# Patient Record
Sex: Female | Born: 1955
Health system: Southern US, Community
[De-identification: ages and names within clinical notes are randomized; demographics above are authoritative.]

## PROBLEM LIST (undated history)

## (undated) DIAGNOSIS — F419 Anxiety disorder, unspecified: Secondary | ICD-10-CM

## (undated) DIAGNOSIS — M810 Age-related osteoporosis without current pathological fracture: Secondary | ICD-10-CM

## (undated) DIAGNOSIS — I639 Cerebral infarction, unspecified: Secondary | ICD-10-CM

## (undated) DIAGNOSIS — E119 Type 2 diabetes mellitus without complications: Secondary | ICD-10-CM

## (undated) DIAGNOSIS — F32A Depression, unspecified: Secondary | ICD-10-CM

## (undated) DIAGNOSIS — H3530 Unspecified macular degeneration: Secondary | ICD-10-CM

## (undated) DIAGNOSIS — I10 Essential (primary) hypertension: Secondary | ICD-10-CM

## (undated) DIAGNOSIS — T7840XA Allergy, unspecified, initial encounter: Secondary | ICD-10-CM

## (undated) DIAGNOSIS — G4730 Sleep apnea, unspecified: Secondary | ICD-10-CM

## (undated) DIAGNOSIS — R011 Cardiac murmur, unspecified: Secondary | ICD-10-CM

## (undated) DIAGNOSIS — N200 Calculus of kidney: Secondary | ICD-10-CM

## (undated) DIAGNOSIS — M1990 Unspecified osteoarthritis, unspecified site: Secondary | ICD-10-CM

## (undated) DIAGNOSIS — K219 Gastro-esophageal reflux disease without esophagitis: Secondary | ICD-10-CM

## (undated) DIAGNOSIS — J449 Chronic obstructive pulmonary disease, unspecified: Secondary | ICD-10-CM

## (undated) HISTORY — PX: TUBAL LIGATION: SHX77

## (undated) HISTORY — DX: Calculus of kidney: N20.0

## (undated) HISTORY — DX: Anxiety disorder, unspecified: F41.9

## (undated) HISTORY — DX: Chronic obstructive pulmonary disease, unspecified: J44.9

## (undated) HISTORY — DX: Depression, unspecified: F32.A

## (undated) HISTORY — DX: Essential (primary) hypertension: I10

## (undated) HISTORY — PX: TONSILLECTOMY: SUR1361

## (undated) HISTORY — PX: JOINT REPLACEMENT: SHX530

## (undated) HISTORY — DX: Cardiac murmur, unspecified: R01.1

## (undated) HISTORY — DX: Allergy, unspecified, initial encounter: T78.40XA

## (undated) HISTORY — PX: CYSTOSCOPY/URETEROSCOPY/HOLMIUM LASER/STENT PLACEMENT: SHX6546

## (undated) HISTORY — DX: Age-related osteoporosis without current pathological fracture: M81.0

## (undated) HISTORY — PX: CHOLECYSTECTOMY: SHX55

## (undated) HISTORY — PX: FRACTURE SURGERY: SHX138

## (undated) HISTORY — DX: Unspecified osteoarthritis, unspecified site: M19.90

## (undated) HISTORY — DX: Type 2 diabetes mellitus without complications: E11.9

---

## 2007-07-18 DIAGNOSIS — Z794 Long term (current) use of insulin: Secondary | ICD-10-CM | POA: Insufficient documentation

## 2007-07-18 DIAGNOSIS — E119 Type 2 diabetes mellitus without complications: Secondary | ICD-10-CM | POA: Insufficient documentation

## 2007-07-20 DIAGNOSIS — E785 Hyperlipidemia, unspecified: Secondary | ICD-10-CM | POA: Insufficient documentation

## 2007-08-10 DIAGNOSIS — F3289 Other specified depressive episodes: Secondary | ICD-10-CM | POA: Insufficient documentation

## 2007-08-10 DIAGNOSIS — F329 Major depressive disorder, single episode, unspecified: Secondary | ICD-10-CM | POA: Insufficient documentation

## 2007-09-16 DIAGNOSIS — M545 Low back pain, unspecified: Secondary | ICD-10-CM | POA: Insufficient documentation

## 2008-08-22 DIAGNOSIS — I10 Essential (primary) hypertension: Secondary | ICD-10-CM | POA: Insufficient documentation

## 2010-08-06 DIAGNOSIS — K769 Liver disease, unspecified: Secondary | ICD-10-CM | POA: Insufficient documentation

## 2010-10-23 DIAGNOSIS — G4733 Obstructive sleep apnea (adult) (pediatric): Secondary | ICD-10-CM | POA: Insufficient documentation

## 2011-06-25 DIAGNOSIS — H5210 Myopia, unspecified eye: Secondary | ICD-10-CM | POA: Insufficient documentation

## 2011-10-16 DIAGNOSIS — R946 Abnormal results of thyroid function studies: Secondary | ICD-10-CM | POA: Insufficient documentation

## 2011-10-21 DIAGNOSIS — K769 Liver disease, unspecified: Secondary | ICD-10-CM | POA: Insufficient documentation

## 2011-12-30 DIAGNOSIS — M9260 Juvenile osteochondrosis of tarsus, unspecified ankle: Secondary | ICD-10-CM | POA: Insufficient documentation

## 2011-12-30 DIAGNOSIS — M7660 Achilles tendinitis, unspecified leg: Secondary | ICD-10-CM | POA: Insufficient documentation

## 2011-12-30 DIAGNOSIS — M216X9 Other acquired deformities of unspecified foot: Secondary | ICD-10-CM | POA: Insufficient documentation

## 2012-02-05 DIAGNOSIS — J449 Chronic obstructive pulmonary disease, unspecified: Secondary | ICD-10-CM | POA: Insufficient documentation

## 2014-06-13 DIAGNOSIS — E669 Obesity, unspecified: Secondary | ICD-10-CM | POA: Insufficient documentation

## 2014-06-13 DIAGNOSIS — M25569 Pain in unspecified knee: Secondary | ICD-10-CM | POA: Insufficient documentation

## 2014-06-13 DIAGNOSIS — M797 Fibromyalgia: Secondary | ICD-10-CM | POA: Insufficient documentation

## 2014-06-13 DIAGNOSIS — G894 Chronic pain syndrome: Secondary | ICD-10-CM | POA: Insufficient documentation

## 2014-06-13 DIAGNOSIS — Z9884 Bariatric surgery status: Secondary | ICD-10-CM | POA: Insufficient documentation

## 2014-06-13 DIAGNOSIS — E559 Vitamin D deficiency, unspecified: Secondary | ICD-10-CM | POA: Insufficient documentation

## 2014-08-10 DIAGNOSIS — M1710 Unilateral primary osteoarthritis, unspecified knee: Secondary | ICD-10-CM | POA: Insufficient documentation

## 2014-09-23 DIAGNOSIS — R87619 Unspecified abnormal cytological findings in specimens from cervix uteri: Secondary | ICD-10-CM | POA: Insufficient documentation

## 2014-09-30 DIAGNOSIS — E119 Type 2 diabetes mellitus without complications: Secondary | ICD-10-CM | POA: Insufficient documentation

## 2014-12-23 DIAGNOSIS — Z7189 Other specified counseling: Secondary | ICD-10-CM | POA: Insufficient documentation

## 2015-11-21 DIAGNOSIS — M5430 Sciatica, unspecified side: Secondary | ICD-10-CM | POA: Insufficient documentation

## 2016-01-10 DIAGNOSIS — M533 Sacrococcygeal disorders, not elsewhere classified: Secondary | ICD-10-CM | POA: Insufficient documentation

## 2016-02-02 DIAGNOSIS — N200 Calculus of kidney: Secondary | ICD-10-CM | POA: Insufficient documentation

## 2016-10-11 DIAGNOSIS — R1900 Intra-abdominal and pelvic swelling, mass and lump, unspecified site: Secondary | ICD-10-CM | POA: Insufficient documentation

## 2016-10-29 DIAGNOSIS — Z01810 Encounter for preprocedural cardiovascular examination: Secondary | ICD-10-CM | POA: Insufficient documentation

## 2016-10-29 DIAGNOSIS — I951 Orthostatic hypotension: Secondary | ICD-10-CM | POA: Insufficient documentation

## 2017-05-21 DIAGNOSIS — R413 Other amnesia: Secondary | ICD-10-CM | POA: Insufficient documentation

## 2019-08-12 ENCOUNTER — Encounter: Payer: Self-pay | Admitting: Internal Medicine

## 2019-10-13 DIAGNOSIS — R109 Unspecified abdominal pain: Secondary | ICD-10-CM | POA: Insufficient documentation

## 2019-10-14 DIAGNOSIS — K5909 Other constipation: Secondary | ICD-10-CM | POA: Insufficient documentation

## 2019-11-15 ENCOUNTER — Encounter: Payer: Self-pay | Admitting: Internal Medicine

## 2020-06-20 DIAGNOSIS — Z8673 Personal history of transient ischemic attack (TIA), and cerebral infarction without residual deficits: Secondary | ICD-10-CM | POA: Insufficient documentation

## 2020-06-20 DIAGNOSIS — Z87442 Personal history of urinary calculi: Secondary | ICD-10-CM | POA: Insufficient documentation

## 2020-06-20 DIAGNOSIS — F1190 Opioid use, unspecified, uncomplicated: Secondary | ICD-10-CM | POA: Insufficient documentation

## 2020-06-20 DIAGNOSIS — M810 Age-related osteoporosis without current pathological fracture: Secondary | ICD-10-CM | POA: Insufficient documentation

## 2020-06-20 DIAGNOSIS — Z8262 Family history of osteoporosis: Secondary | ICD-10-CM | POA: Insufficient documentation

## 2020-06-20 DIAGNOSIS — F419 Anxiety disorder, unspecified: Secondary | ICD-10-CM | POA: Insufficient documentation

## 2020-06-20 DIAGNOSIS — Z9181 History of falling: Secondary | ICD-10-CM | POA: Insufficient documentation

## 2020-06-26 ENCOUNTER — Emergency Department
Admission: EM | Admit: 2020-06-26 | Discharge: 2020-06-26 | Disposition: A | Discharge status: Home / Self Care | Payer: Medicare Other | Attending: Emergency Medicine | Admitting: Emergency Medicine

## 2020-06-26 ENCOUNTER — Other Ambulatory Visit: Payer: Self-pay

## 2020-06-26 ENCOUNTER — Emergency Department: Payer: Medicare Other

## 2020-06-26 DIAGNOSIS — J1282 Pneumonia due to coronavirus disease 2019: Secondary | ICD-10-CM | POA: Diagnosis not present

## 2020-06-26 DIAGNOSIS — U071 COVID-19: Secondary | ICD-10-CM

## 2020-06-26 DIAGNOSIS — M791 Myalgia, unspecified site: Secondary | ICD-10-CM | POA: Diagnosis present

## 2020-06-26 IMAGING — CR DG CHEST 2V
1 series · 2 of 2 positions shown · non-contrast
Comparison: None.

CLINICAL DATA: Cough

EXAM:
CHEST - 2 VIEW

[Series 1: dg chest 2 view · 0.14mm/px · 2 of 2 slices shown]
[im 1/2]
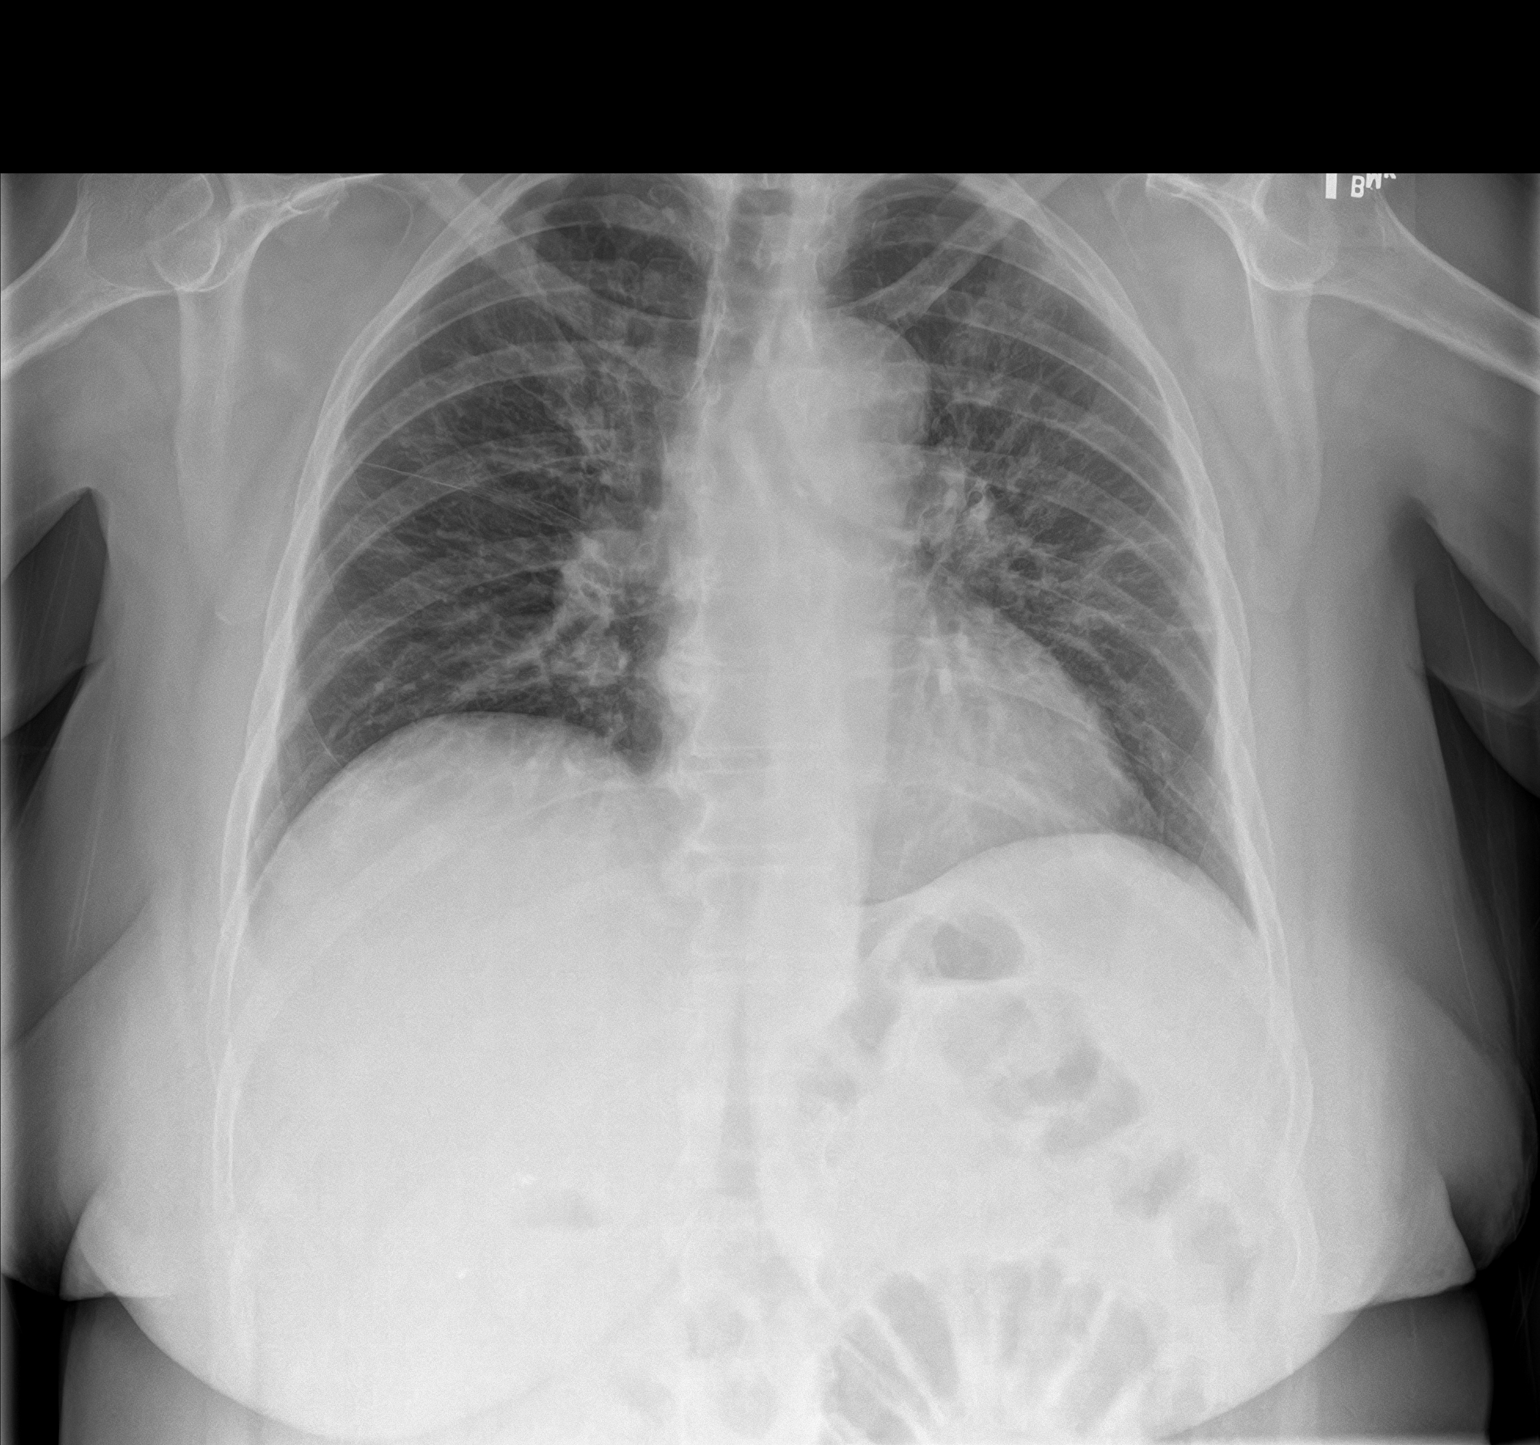
[im 2/2]
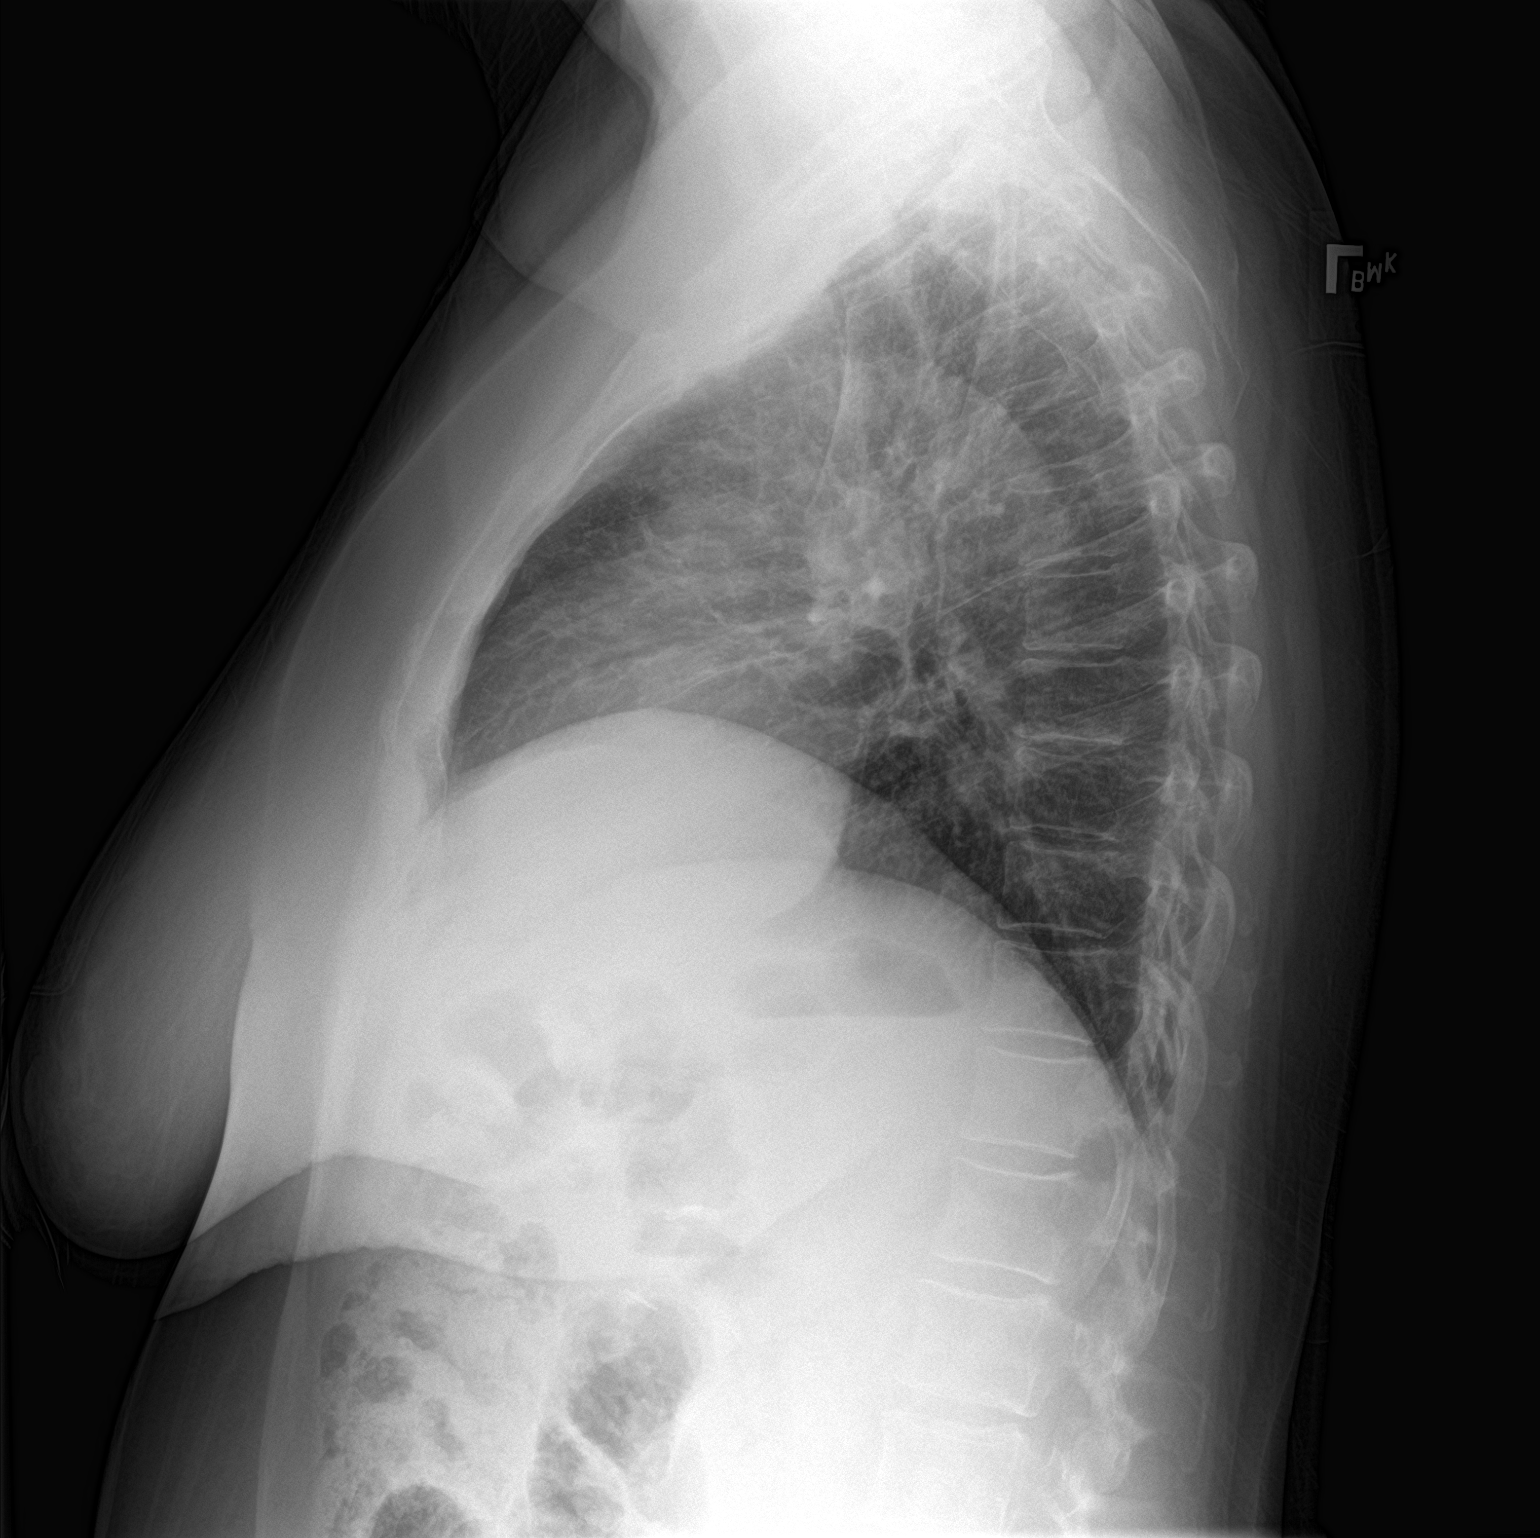

[2 of 2 positions shown; findings below may reference images not displayed]

FINDINGS: Streaky bilateral perihilar opacities. No pleural effusion. Normal
heart size. No pneumothorax.
IMPRESSION: Streaky bilateral perihilar opacities probably representing
pneumonia.

## 2020-06-26 MED ORDER — AZITHROMYCIN 250 MG PO TABS: ORAL_TABLET | ORAL | 0 refills | Status: DC

## 2020-06-26 MED ORDER — ALBUTEROL SULFATE HFA 108 (90 BASE) MCG/ACT IN AERS: 1.0000 | INHALATION_SPRAY | RESPIRATORY_TRACT | 1 refills | Status: DC | PRN

## 2020-06-26 MED ORDER — AZITHROMYCIN 500 MG PO TABS
500.0000 mg | ORAL_TABLET | Freq: Once | ORAL | Status: AC
  Administered 2020-06-26: 500 mg via ORAL
  Filled 2020-06-26: qty 1

## 2020-06-26 MED ORDER — PREDNISONE 10 MG (21) PO TBPK: ORAL_TABLET | ORAL | 0 refills | Status: DC

## 2020-06-26 NOTE — ED Provider Notes (Signed)
[facility] Emergency Department Provider Note  ____________________________________________  Time seen: Approximately 8:40 PM  I have reviewed the triage vital signs and the nursing notes.   HISTORY  Chief Complaint Covid Positive   HPI Lisa Matthews is a 65 y.o. female who presents to the emergency department for treatment and evaluation of body aches, fever, and cough for 2 weeks. She has had COVID and was treated with prednisone. She continues to have fever, body aches, chills, and cough.      No past medical history on file.  There are no problems to display for this patient.   Prior to Admission medications   Medication Sig Start Date End Date Taking? Authorizing Provider  albuterol (VENTOLIN HFA) 108 (90 Base) MCG/ACT inhaler Inhale 1 puff into the lungs every 4 (four) hours as needed for wheezing or shortness of breath. 06/26/20  Yes Alejandria Wessells B, FNP  azithromycin (ZITHROMAX) 250 MG tablet 2 tablets today, then 1 tablet for the next 4 days. 06/26/20  Yes Najae Rathert B, FNP  predniSONE (STERAPRED UNI-PAK 21 TAB) 10 MG (21) TBPK tablet Take 6 tablets on the first day and decrease by 1 tablet each day until finished. 06/26/20  Yes Ardyth Kelso B, FNP    Allergies Penicillins and Sulfa antibiotics  No family history on file.  Social History    Review of Systems Constitutional: Positive for fever/chills. Decreased appetite. ENT: No sore throat. Cardiovascular: Denies chest pain. Respiratory: Occasional shortness of breath. Positive for cough. Negative wheezing.  Gastrointestinal: Negative nausea,  no vomiting.  no diarrhea.  Musculoskeletal: Positive for body aches Skin: Negative for rash. Neurological: Negative for headaches ____________________________________________   PHYSICAL EXAM:  VITAL SIGNS: ED Triage Vitals [06/26/20 1934]  Enc Vitals Group     BP 119/69     Pulse Rate 69     Resp 20     Temp 98.3 F (36.8 C)      Temp Source Oral     SpO2 96 %     Weight 181 lb (82.1 kg)     Height 5\' 4"  (1.626 m)     Head Circumference      Peak Flow      Pain Score 8     Pain Loc      Pain Edu?      Excl. in [place]?     Constitutional: Alert and oriented. Acutely ill appearing and in no acute distress. Eyes: Conjunctivae are normal. Ears: exam deferred Nose: no sinus congestion noted; no rhinnorhea. Mouth/Throat: Mucous membranes are moist.  Oropharynx clear. Tonsils not visualized. Uvula midline. Neck: No stridor.  Lymphatic: No cervical lymphadenopathy. Cardiovascular: Normal rate, regular rhythm. Good peripheral circulation. Respiratory: Respirations are even and unlabored.  No retractions. Gastrointestinal: Soft and nontender.  Musculoskeletal: FROM x 4 extremities.  Neurologic:  Normal speech and language. Skin:  Skin is warm, dry and intact. No rash noted. Psychiatric: Mood and affect are normal. Speech and behavior are normal.  ____________________________________________   LABS (all labs ordered are listed, but only abnormal results are displayed)  Labs Reviewed - No data to display ____________________________________________  EKG  Not indicated. ____________________________________________  RADIOLOGY  Chest x-ray shows streaky perihilar opacities likely pneumonia. ____________________________________________   PROCEDURES  Procedure(s) performed: None  Critical Care performed: No ____________________________________________   INITIAL IMPRESSION / ASSESSMENT AND PLAN / ED COURSE  65 y.o. female who presents to the ER for continued COVID symptoms that are  not improving. See  HPI  for further details.  Chest x-ray is concerning for pneumonia. Vital signs are without indication of sepsis. She is not hypoxic. She will be discharged home with Azithromycin and prednisone taper as well as ventolin inhaler. She was encouraged to return if she becomes short of breath, develops chest  pain, or any other symptom of concern.   Medications  azithromycin (ZITHROMAX) tablet 500 mg (500 mg Oral Given 06/26/20 2139)    ED Discharge Orders         Ordered    azithromycin (ZITHROMAX) 250 MG tablet        06/26/20 2127    predniSONE (STERAPRED UNI-PAK 21 TAB) 10 MG (21) TBPK tablet        06/26/20 2127    albuterol (VENTOLIN HFA) 108 (90 Base) MCG/ACT inhaler  Every 4 hours PRN        06/26/20 2127           Pertinent labs & imaging results that were available during my care of the patient were reviewed by me and considered in my medical decision making (see chart for details).    If controlled substance prescribed during this visit, 12 month history viewed on the [facility] prior to issuing an initial prescription for Schedule II or III opiod. ____________________________________________   FINAL CLINICAL IMPRESSION(S) / ED DIAGNOSES  Final diagnoses:  Pneumonia due to COVID-19 virus    Note:  This document was prepared using Dragon voice recognition software and may include unintentional dictation errors.    Victorino Dike, FNP 06/26/20 2248    Delman Kitten, MD 06/27/20 2224

## 2020-06-26 NOTE — ED Triage Notes (Signed)
Pt in with co body aches, fever, cough for 2 weeks. Pt tested positive covid at that time. Pt here for persistent symptoms, no distress noted in triage.

## 2020-09-25 ENCOUNTER — Ambulatory Visit (INDEPENDENT_AMBULATORY_CARE_PROVIDER_SITE_OTHER): Payer: Medicare Other | Admitting: Internal Medicine

## 2020-09-25 ENCOUNTER — Encounter: Payer: Self-pay | Admitting: *Deleted

## 2020-09-25 ENCOUNTER — Encounter: Payer: Self-pay | Admitting: Internal Medicine

## 2020-09-25 ENCOUNTER — Other Ambulatory Visit: Payer: Self-pay

## 2020-09-25 ENCOUNTER — Ambulatory Visit: Admission: RE | Admit: 2020-09-25 | Payer: Medicare Other | Source: Ambulatory Visit

## 2020-09-25 VITALS — BP 187/85 | HR 66 | Temp 98.0°F | Ht 63.5 in | Wt 181.0 lb

## 2020-09-25 DIAGNOSIS — R011 Cardiac murmur, unspecified: Secondary | ICD-10-CM | POA: Diagnosis not present

## 2020-09-25 DIAGNOSIS — G8929 Other chronic pain: Secondary | ICD-10-CM | POA: Diagnosis not present

## 2020-09-25 DIAGNOSIS — R1013 Epigastric pain: Secondary | ICD-10-CM

## 2020-09-25 DIAGNOSIS — E119 Type 2 diabetes mellitus without complications: Secondary | ICD-10-CM | POA: Diagnosis not present

## 2020-09-25 DIAGNOSIS — I25119 Atherosclerotic heart disease of native coronary artery with unspecified angina pectoris: Secondary | ICD-10-CM

## 2020-09-25 LAB — MICROSCOPIC EXAMINATION: RBC, Urine: NONE SEEN /hpf (ref 0–2)

## 2020-09-25 LAB — URINALYSIS, ROUTINE W REFLEX MICROSCOPIC
Bilirubin, UA: NEGATIVE
Ketones, UA: NEGATIVE
Nitrite, UA: NEGATIVE
Protein,UA: NEGATIVE
RBC, UA: NEGATIVE
Specific Gravity, UA: 1.025 (ref 1.005–1.030)
Urobilinogen, Ur: 0.2 mg/dL (ref 0.2–1.0)
pH, UA: 5.5 (ref 5.0–7.5)

## 2020-09-25 LAB — BAYER DCA HB A1C WAIVED: HB A1C (BAYER DCA - WAIVED): 8.3 % — ABNORMAL HIGH (ref <7.0)

## 2020-09-25 MED ORDER — METFORMIN HCL 500 MG PO TABS: 500.0000 mg | ORAL_TABLET | Freq: Two times a day (BID) | ORAL | 3 refills | Status: DC

## 2020-09-25 MED ORDER — DAPAGLIFLOZIN PROPANEDIOL 5 MG PO TABS: 5.0000 mg | ORAL_TABLET | Freq: Every day | ORAL | 6 refills | Status: DC

## 2020-09-25 MED ORDER — LOSARTAN POTASSIUM 25 MG PO TABS: 25.0000 mg | ORAL_TABLET | Freq: Every day | ORAL | 3 refills | Status: DC

## 2020-09-25 MED ORDER — AMLODIPINE BESYLATE 2.5 MG PO TABS: 2.5000 mg | ORAL_TABLET | Freq: Every day | ORAL | 0 refills | Status: DC

## 2020-09-25 MED ORDER — OMEPRAZOLE 40 MG PO CPDR: 40.0000 mg | DELAYED_RELEASE_CAPSULE | Freq: Every day | ORAL | 3 refills | Status: DC

## 2020-09-25 NOTE — Progress Notes (Signed)
There were no vitals taken for this visit.   Subjective:    Patient ID: Lisa Matthews, female    DOB: 07-May-1956, 65 y.o.   MRN: [id number]  HPI: Lisa Matthews is a 65 y.o. female  Pt is here to [facility] She says she was taking meds s/p cholecystectomy x July 2021 fell and broke her rt femur 7/21. Sees ortho is in a lot of pain - is on oxycodone for such. Sees Dr. Selinda Flavin in [place].   Per pt says she has heart problems. Says she has heartburn in the central part of her upper abdomen, in the Lower E area/ epigastric pain - very painful just sitting around causes this. Says this started a month ago. Says it goes to the back and says she feels like its a stabbing pain.    Hypertension This is a chronic problem. Associated symptoms include anxiety. Pertinent negatives include no blurred vision, headaches, malaise/fatigue, neck pain, orthopnea, peripheral edema, PND or sweats.  Diabetes She presents for her follow-up diabetic visit. She has type 2 diabetes mellitus. Pertinent negatives for hypoglycemia include no headaches or sweats. Associated symptoms include weight loss. Pertinent negatives for diabetes include no blurred vision.  COPD There is no chest tightness or hoarse voice. Associated symptoms include weight loss. Pertinent negatives include no fever, headaches, malaise/fatigue, PND or sweats. Her past medical history is significant for COPD.  Abdominal Pain This is a new problem. The problem has been gradually worsening. The pain is located in the epigastric region. The pain is at a severity of 6/10. The pain is moderate. The quality of the pain is colicky. The abdominal pain radiates to the back. Associated symptoms include weight loss. Pertinent negatives include no anorexia, arthralgias, belching, constipation, diarrhea, dysuria (card), fever, flatus, frequency, headaches or hematochezia. Vomiting:  Associated symptoms comments: 25 lbs of weight loss in 1 year Gastric  bypass 2013 lost 70 lbs since then lost 25 lbs .    Chief Complaint  Patient presents with  . New Patient (Initial Visit)  . Hypertension  . Diabetes  . Anxiety  . Depression  . COPD  . Osteoarthritis    Relevant past medical, surgical, family and social history reviewed and updated as indicated. Interim medical history since our last visit reviewed. Allergies and medications reviewed and updated.  Review of Systems  Constitutional: Positive for weight loss. Negative for fever and malaise/fatigue.  HENT: Negative for hoarse voice.   Eyes: Negative for blurred vision.  Cardiovascular: Negative for orthopnea and PND.  Gastrointestinal: Positive for abdominal pain. Negative for anorexia, constipation, diarrhea, flatus and hematochezia. Vomiting:   Genitourinary: Negative for dysuria (card) and frequency.  Musculoskeletal: Negative for arthralgias and neck pain.  Neurological: Negative for headaches.    Per HPI unless specifically indicated above     Objective:    There were no vitals taken for this visit.  Wt Readings from Last 3 Encounters:  06/26/20 181 lb (82.1 kg)    Physical Exam Vitals and nursing note reviewed.  Constitutional:      General: She is not in acute distress.    Appearance: Normal appearance. She is not ill-appearing or diaphoretic.  HENT:     Head: Normocephalic and atraumatic.     Right Ear: Tympanic membrane and external ear normal. There is no impacted cerumen.     Left Ear: External ear normal.     Nose: No congestion or rhinorrhea.     Mouth/Throat:     Pharynx:  No oropharyngeal exudate or posterior oropharyngeal erythema.  Eyes:     Conjunctiva/sclera: Conjunctivae normal.     Pupils: Pupils are equal, round, and reactive to light.  Cardiovascular:     Rate and Rhythm: Normal rate and regular rhythm.     Heart sounds: No murmur heard. No friction rub. No gallop.   Pulmonary:     Effort: No respiratory distress.     Breath sounds: No  stridor. No wheezing or rhonchi.  Chest:     Chest wall: No tenderness.  Abdominal:     General: Abdomen is flat. Bowel sounds are normal. There is no distension.     Palpations: Abdomen is soft. There is no mass.     Tenderness: There is no abdominal tenderness. There is no guarding.  Musculoskeletal:        General: No swelling or deformity.     Cervical back: Normal range of motion and neck supple. No rigidity or tenderness.     Right lower leg: No edema.     Left lower leg: No edema.  Skin:    General: Skin is warm and dry.     Coloration: Skin is not jaundiced.     Findings: No erythema.  Neurological:     Mental Status: She is alert and oriented to person, place, and time. Mental status is at baseline.  Psychiatric:        Mood and Affect: Mood normal.        Behavior: Behavior normal.        Thought Content: Thought content normal.        Judgment: Judgment normal.     No results found for this or any previous visit.      Current Outpatient Medications:  .  acetaminophen (TYLENOL) 650 MG CR tablet, Take by mouth., Disp: , Rfl:  .  amLODipine (NORVASC) 2.5 MG tablet, Take 2.5 mg by mouth daily., Disp: , Rfl:  .  atenolol (TENORMIN) 50 MG tablet, Take by mouth., Disp: , Rfl:  .  Baclofen 5 MG TABS, Take 1 tablet by mouth 2 (two) times daily as needed., Disp: , Rfl:  .  Biotin w/ Vitamins C & E (HAIR/SKIN/NAILS PO), Take by mouth., Disp: , Rfl:  .  Calcium Carbonate-Vitamin D 600-400 MG-UNIT tablet, Take 1 tablet by mouth daily., Disp: , Rfl:  .  Cholecalciferol 25 MCG (1000 UT) tablet, Take by mouth., Disp: , Rfl:  .  cyanocobalamin 1000 MCG tablet, Take by mouth., Disp: , Rfl:  .  ELDERBERRY PO, Take by mouth., Disp: , Rfl:  .  gabapentin (NEURONTIN) 300 MG capsule, Take by mouth., Disp: , Rfl:  .  glipiZIDE-metformin (METAGLIP) 2.5-500 MG tablet, Take 1 tablet by mouth 2 (two) times daily., Disp: , Rfl:  .  ibuprofen (ADVIL) 600 MG tablet, Take 600 mg by mouth 2 (two)  times daily as needed., Disp: , Rfl:  .  insulin glargine (LANTUS) 100 UNIT/ML injection, Inject into the skin., Disp: , Rfl:  .  Lancets MISC, Frequency:PRN   Dosage:0.0     Instructions:  Note:Dose: 1, Disp: , Rfl:  .  mirtazapine (REMERON) 30 MG tablet, Take by mouth., Disp: , Rfl:  .  oxyCODONE (OXY IR/ROXICODONE) 5 MG immediate release tablet, Take 5 mg by mouth 2 (two) times daily as needed., Disp: , Rfl:  .  Teriparatide, Recombinant, 620 MCG/2.48ML SOPN, Inject into the skin., Disp: , Rfl:  .  tiZANidine (ZANAFLEX) 4 MG tablet, Take by mouth.,  Disp: , Rfl:  .  venlafaxine XR (EFFEXOR-XR) 150 MG 24 hr capsule, Take by mouth., Disp: , Rfl:  .  vitamin C (ASCORBIC ACID) 500 MG tablet, Take 500 mg by mouth daily., Disp: , Rfl:  .  zinc gluconate 50 MG tablet, Take 50 mg by mouth daily., Disp: , Rfl:     Assessment & Plan:  1. Rt leg pain :  Sees pain mx for such Will refer . To obtain pain meds from her pain mx specialist @ last seen in [place].   2. Epigastric pain : reconfirmed with pt NO PAIN TODAY.  CHECK EKG  Will refer to GI as well. Will check Ct abdomen/ pelvis Check CMP/ Lipase h.o gall stones.  Advised to call the office or go to the ER if she develops chest pain, worsening abdominal pain, , any new onset of bleeding / black stools or  fresh red blood from any orifice,  shortness of breath dizziness or tingling or numbness.  Pt verbalized understanding of such.  3. DM 8.3 from 11  Is on  lantus SSI and glipizide/ metformin for scuh consider stopping sulfonylureas. Change to Lantus 30 units q pm is on 25 untis of lantus.  check HbA1c,  urine  microalbumin  diabetic diet plan given to pt  adviced regarding hypoglycemia and instructions given to pt today on how to prevent and treat the same if it were to occur. pt acknowledges the plan and voices understanding of the same.  exercise plan given and encouraged.   advice diabetic yearly podiatry, ophthalmology , nutritionist ,  dental check q 6 months,  4. HTN : add losartan amlodipine sent to pharmacy Continue current meds.  Medication compliance emphasised. pt advised to keep Bp logs. Pt verbalised understanding of the same. Pt to have a low salt diet . Exercise to reach a goal of at least 150 mins a week.  lifestyle modifications explained and pt understands importance of the above.  5. CAD :  S/p stenting , ho cardiac murmer  EKG -ve at this time.    6. HTG  HLD - LDL NOT AT GOAL consider repatha / pt is allergic to statins Consider zetia.   Problem List Items Addressed This Visit   None   Visit Diagnoses    Other chronic pain    -  Primary   Relevant Medications   acetaminophen (TYLENOL) 650 MG CR tablet   Baclofen 5 MG TABS   gabapentin (NEURONTIN) 300 MG capsule   ibuprofen (ADVIL) 600 MG tablet   mirtazapine (REMERON) 30 MG tablet   oxyCODONE (OXY IR/ROXICODONE) 5 MG immediate release tablet   tiZANidine (ZANAFLEX) 4 MG tablet   venlafaxine XR (EFFEXOR-XR) 150 MG 24 hr capsule   Other Relevant Orders   Ambulatory referral to Pain Clinic   Epigastric pain       Relevant Orders   CT ABDOMEN PELVIS W WO CONTRAST   CBC with Differential/Platelet   Comprehensive metabolic panel   Lipid panel   TSH   Urinalysis, Routine w reflex microscopic   Lipase   Diabetes mellitus without complication (HCC)       Relevant Medications   glipiZIDE-metformin (METAGLIP) 2.5-500 MG tablet   insulin glargine (LANTUS) 100 UNIT/ML injection   Other Relevant Orders   Bayer DCA Hb A1c Waived   Heart murmur       Relevant Orders   EKG 12-Lead       Follow up plan: No follow-ups on file.  Cscope - referred to GI  Mammogram - will check.

## 2020-09-26 ENCOUNTER — Ambulatory Visit
Admission: RE | Admit: 2020-09-26 | Discharge: 2020-09-26 | Disposition: A | Discharge status: Home / Self Care | Payer: Medicare Other | Source: Ambulatory Visit | Attending: Internal Medicine | Admitting: Internal Medicine

## 2020-09-26 ENCOUNTER — Telehealth: Payer: Self-pay

## 2020-09-26 ENCOUNTER — Other Ambulatory Visit: Payer: Self-pay

## 2020-09-26 DIAGNOSIS — R1013 Epigastric pain: Secondary | ICD-10-CM

## 2020-09-26 LAB — COMPREHENSIVE METABOLIC PANEL
ALT: 20 IU/L (ref 0–32)
AST: 16 IU/L (ref 0–40)
Albumin/Globulin Ratio: 1.6 (ref 1.2–2.2)
Albumin: 4.1 g/dL (ref 3.8–4.8)
Alkaline Phosphatase: 151 IU/L — ABNORMAL HIGH (ref 44–121)
BUN/Creatinine Ratio: 22 (ref 12–28)
BUN: 13 mg/dL (ref 8–27)
Bilirubin Total: 0.2 mg/dL (ref 0.0–1.2)
CO2: 22 mmol/L (ref 20–29)
Calcium: 10.5 mg/dL — ABNORMAL HIGH (ref 8.7–10.3)
Chloride: 105 mmol/L (ref 96–106)
Creatinine, Ser: 0.6 mg/dL (ref 0.57–1.00)
Globulin, Total: 2.6 g/dL (ref 1.5–4.5)
Glucose: 154 mg/dL — ABNORMAL HIGH (ref 65–99)
Potassium: 4.7 mmol/L (ref 3.5–5.2)
Sodium: 144 mmol/L (ref 134–144)
Total Protein: 6.7 g/dL (ref 6.0–8.5)
eGFR: 100 mL/min/{1.73_m2} (ref >59)

## 2020-09-26 LAB — LIPID PANEL
Chol/HDL Ratio: 6 ratio — ABNORMAL HIGH (ref 0.0–4.4)
Cholesterol, Total: 260 mg/dL — ABNORMAL HIGH (ref 100–199)
HDL: 43 mg/dL (ref >39)
LDL Chol Calc (NIH): 151 mg/dL — ABNORMAL HIGH (ref 0–99)
Triglycerides: 356 mg/dL — ABNORMAL HIGH (ref 0–149)
VLDL Cholesterol Cal: 66 mg/dL — ABNORMAL HIGH (ref 5–40)

## 2020-09-26 LAB — CBC WITH DIFFERENTIAL/PLATELET
Basophils Absolute: 0 10*3/uL (ref 0.0–0.2)
Basos: 1 %
EOS (ABSOLUTE): 0.2 10*3/uL (ref 0.0–0.4)
Eos: 3 %
Hematocrit: 40.4 % (ref 34.0–46.6)
Hemoglobin: 13.6 g/dL (ref 11.1–15.9)
Immature Grans (Abs): 0 10*3/uL (ref 0.0–0.1)
Immature Granulocytes: 1 %
Lymphocytes Absolute: 1.9 10*3/uL (ref 0.7–3.1)
Lymphs: 32 %
MCH: 30.3 pg (ref 26.6–33.0)
MCHC: 33.7 g/dL (ref 31.5–35.7)
MCV: 90 fL (ref 79–97)
Monocytes Absolute: 0.6 10*3/uL (ref 0.1–0.9)
Monocytes: 11 %
Neutrophils Absolute: 3.2 10*3/uL (ref 1.4–7.0)
Neutrophils: 52 %
Platelets: 195 10*3/uL (ref 150–450)
RBC: 4.49 x10E6/uL (ref 3.77–5.28)
RDW: 12.2 % (ref 11.7–15.4)
WBC: 5.9 10*3/uL (ref 3.4–10.8)

## 2020-09-26 LAB — LIPASE: Lipase: 36 U/L (ref 14–72)

## 2020-09-26 LAB — TSH: TSH: 2.25 u[IU]/mL (ref 0.450–4.500)

## 2020-09-26 IMAGING — CT CT ABD-PELV W/ CM
1 of 3 series · 14 of 32 positions shown, 19 images · IV contrast (APPLIED)
Comparison: None.

CLINICAL DATA: Upper abdominal and epigastric pain over the last
month. Previous bariatric surgery.

EXAM:
CT ABDOMEN AND PELVIS WITH CONTRAST
TECHNIQUE: Multidetector CT imaging of the abdomen and pelvis was performed
using the standard protocol following bolus administration of
intravenous contrast.
CONTRAST:  100mL OMNIPAQUE IOHEXOL 300 MG/ML  SOLN

[Series 2: axial st · axial · 0.82mm/px · z∈[-953,-478]mm · 14 of 109 slices shown, 19 images]
[im 7/109  soft-tissue]
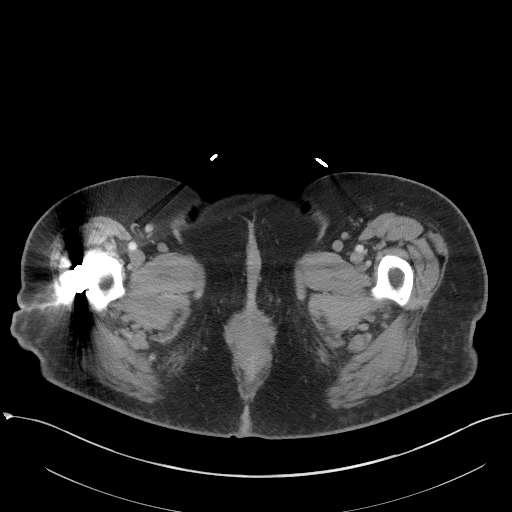
[im 7/109  bone]
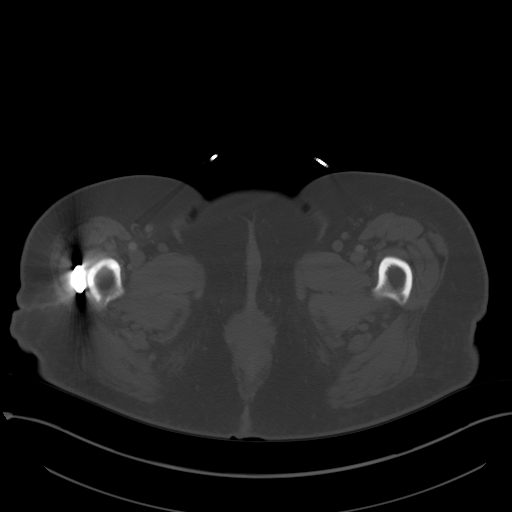
[im 13/109  soft-tissue]
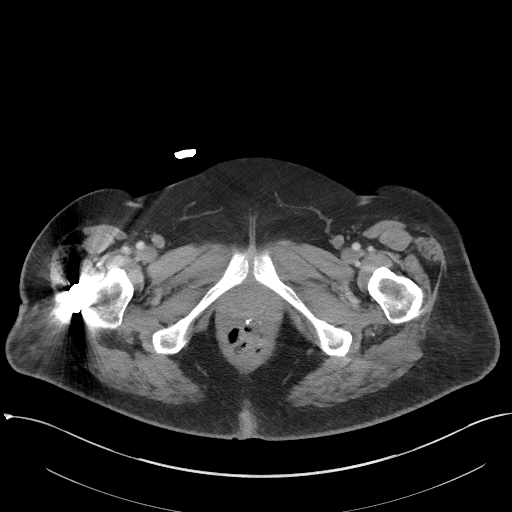
[im 26/109  soft-tissue]
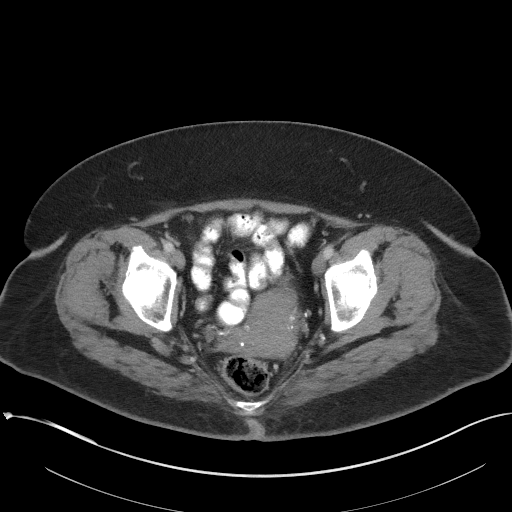
[im 32/109  soft-tissue]
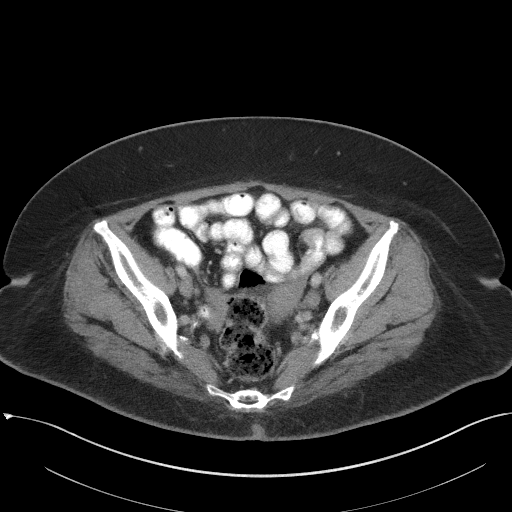
[im 39/109  soft-tissue]
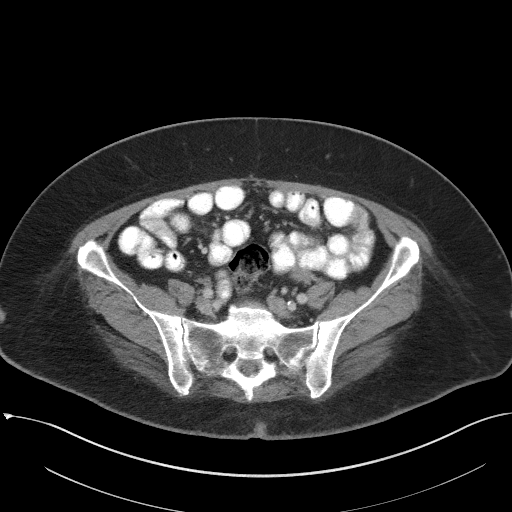
[im 45/109  soft-tissue]
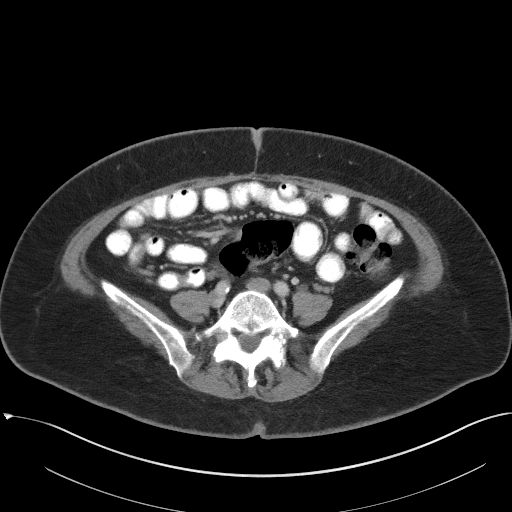
[im 58/109  soft-tissue]
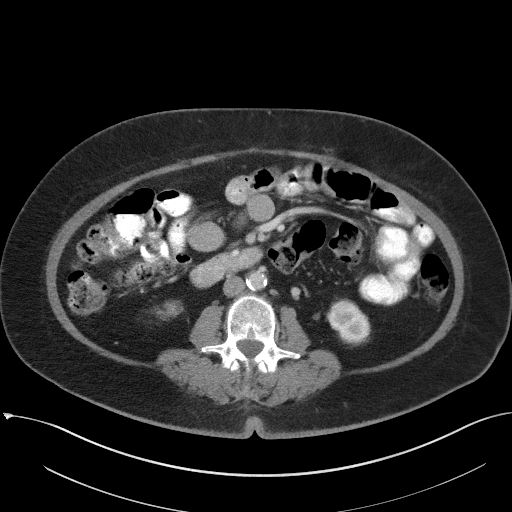
[im 64/109  soft-tissue]
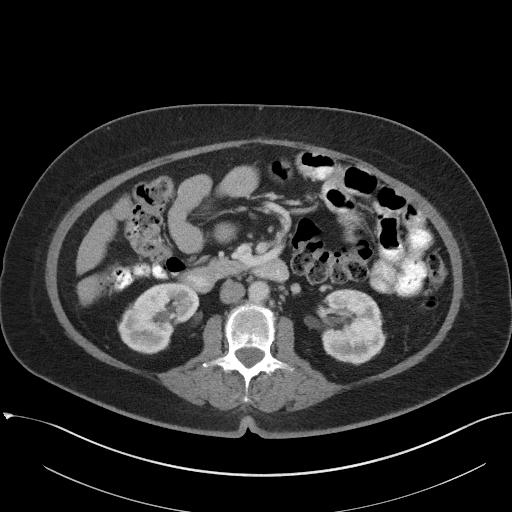
[im 70/109  soft-tissue]
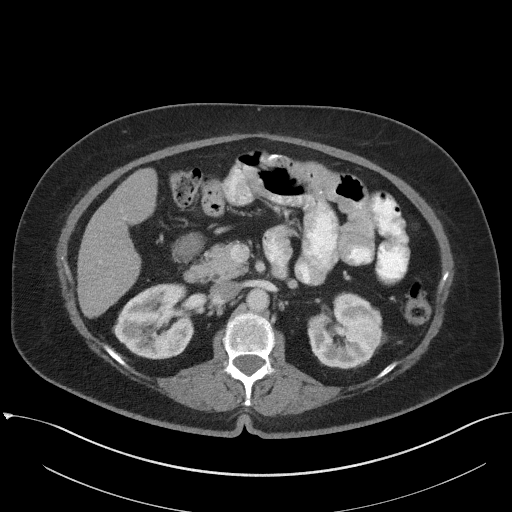
[im 70/109  bone]
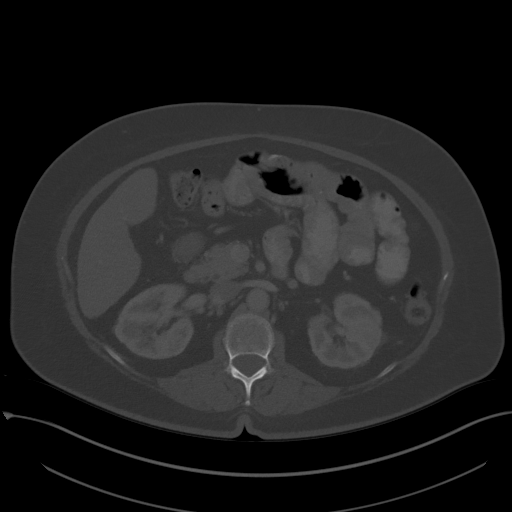
[im 77/109  soft-tissue]
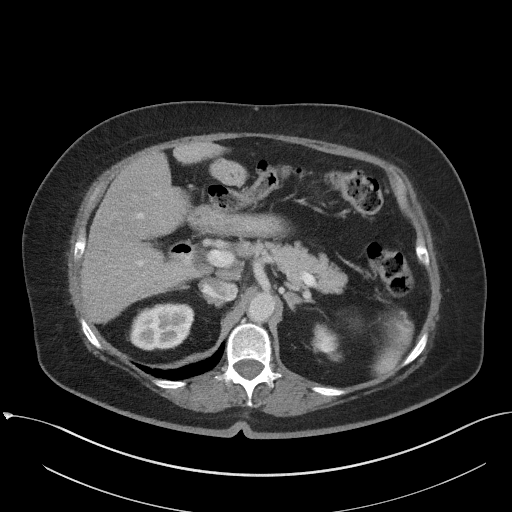
[im 83/109  soft-tissue]
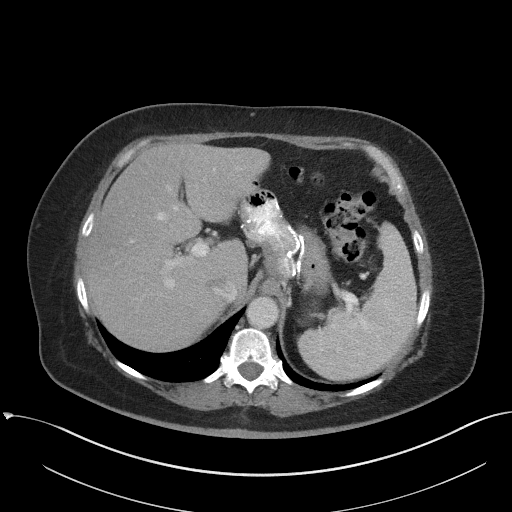
[im 83/109  lung]
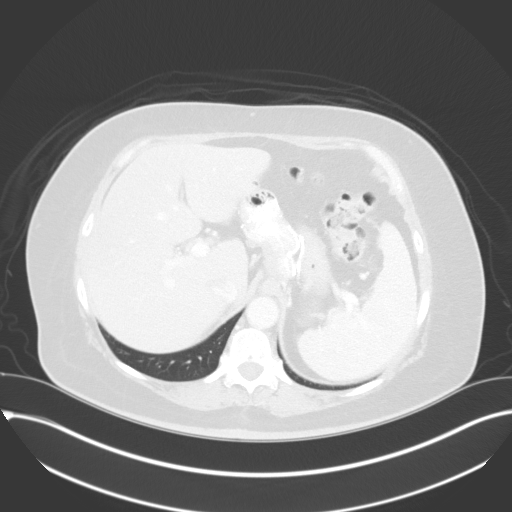
[im 89/109  lung]
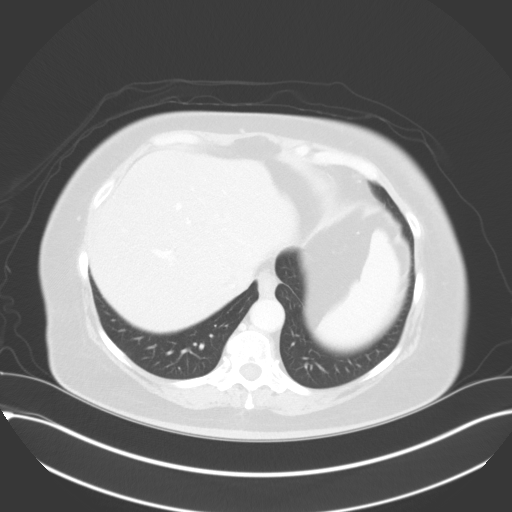
[im 96/109  soft-tissue]
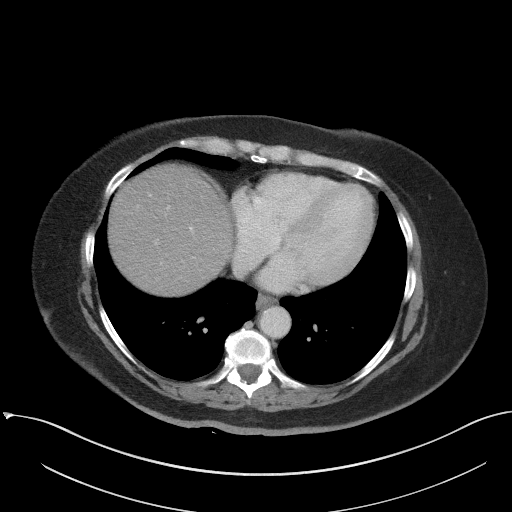
[im 96/109  lung]
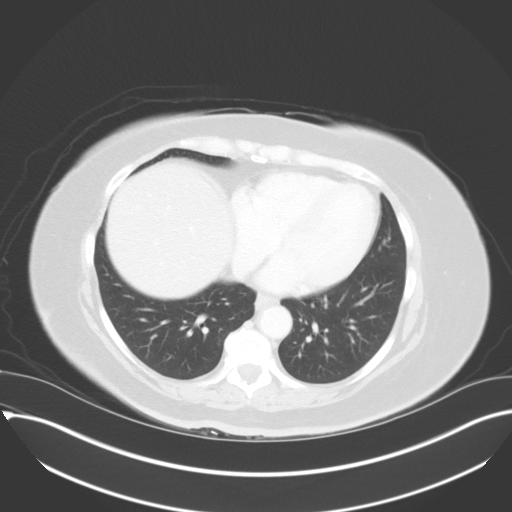
[im 102/109  soft-tissue]
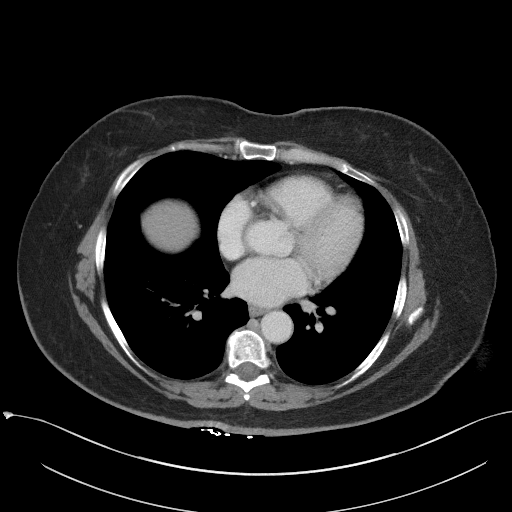
[im 102/109  lung]
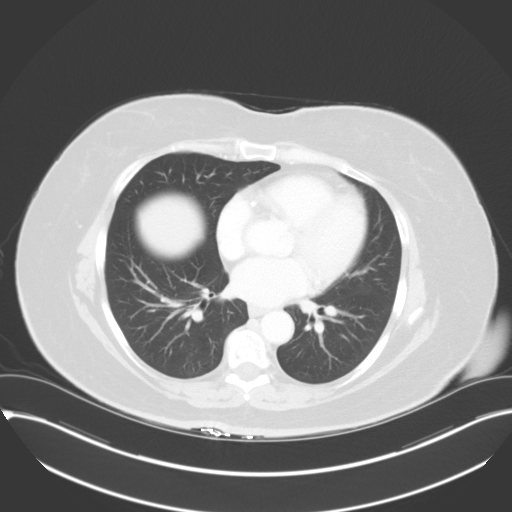

[14 of 32 positions shown; findings below may reference images not displayed]

FINDINGS: Lower chest: Lung bases are clear.  No pleural or pericardial fluid.

Hepatobiliary: Previous cholecystectomy. Liver parenchyma shows mild
fatty change without focal lesion.

Pancreas: Normal

Spleen: Normal

Adrenals/Urinary Tract: Adrenal glands are normal. Left kidney is
normal. No cyst, mass, stone or hydronephrosis. Right kidney shows
mild atrophy of the lower pole with a nonobstructing 9 mm stone. No
stone in either ureter. Bladder is normal.

Stomach/Bowel: Previous bariatric surgery with Roux-en-Y
configuration. No complicating feature seen related to that. No
other intestinal abnormality seen. No obstruction. No inflammatory
change.

Vascular/Lymphatic: Aortic atherosclerosis. No aneurysm. IVC is
normal. No adenopathy.

Reproductive: Normal.  No pelvic mass.

Other: No free fluid or air.

Musculoskeletal: Ordinary lower lumbar degenerative changes. No
evidence of abdominal wall hernia.
IMPRESSION: 1. No acute finding by CT. Previous bariatric surgery with Roux-en-Y
configuration. No complicating feature seen related to that.
2. Mild fatty change of the liver.
3. Previous cholecystectomy.
4. Aortic atherosclerosis.
5. Nonobstructing 9 mm stone in the lower pole of the right kidney.
6. These results will be called to the ordering clinician or
representative by the Radiologist Assistant, and communication
documented in the PACS or [REDACTED].

Aortic Atherosclerosis ([F3]-[F3]).

## 2020-09-26 MED ORDER — FENOFIBRATE 145 MG PO TABS: 145.0000 mg | ORAL_TABLET | Freq: Every day | ORAL | 6 refills | Status: DC

## 2020-09-26 MED ORDER — IOHEXOL 300 MG/ML  SOLN
100.0000 mL | Freq: Once | INTRAMUSCULAR | Status: AC | PRN
  Administered 2020-09-26: 100 mL via INTRAVENOUS

## 2020-09-26 NOTE — Telephone Encounter (Signed)
Pt states that someone from the office called her no documentation   Copied from [place] [phone]. Topic: General - Other >> Sep 26, 2020  2:21 PM Tessa Lerner A wrote: Reason for CRM: Patient has returned missed call from practice  Please contact patient to further advise when possible

## 2020-09-26 NOTE — Telephone Encounter (Signed)
Routing to provider  

## 2020-09-26 NOTE — Telephone Encounter (Signed)
Call pt to schedule appt for tomorrow per Dr Neomia Dear pt states that she is going out of town tomorrow and can not come in tomorrow she states that she has read her results online and with her cholesterol she can not take statin drugs. Please advise.

## 2020-09-26 NOTE — Telephone Encounter (Signed)
Copied from [place] [id number]. Topic: General - Other >> Sep 26, 2020 11:43 AM Tessa Lerner A wrote: Reason for CRM: Olivia Mackie with [facility] has called to provide reports for patient's imaging  Olivia Mackie shares that there were "no acute findings by CT"  Please contact to further discuss report if needed

## 2020-09-27 NOTE — Telephone Encounter (Signed)
Pt returned call and needs to be reached either before 11:30 or after 1:30 pm. Please advise [phone]

## 2020-09-27 NOTE — Telephone Encounter (Signed)
Patient was made aware, verbalized understanding, has appointment next week.

## 2020-10-02 ENCOUNTER — Ambulatory Visit: Payer: Self-pay | Admitting: *Deleted

## 2020-10-02 NOTE — Telephone Encounter (Addendum)
Pt called in concerned that her BP has been elevated for 4 days now.   Her BP medication was switched around recently.  C/o ringing in her ears all the time and having a headache.  Her BP this morning was 160/100 and it's been around this reading for the last 4 days.  She is on her way out of town when she called.   I let her know I would pass this information to Dr.Vigg and someone would call her back.   She was agreeable to this.   She will be back in town around 4:00 today.  She can be reached on her cell phone.  [phone].  The office is closed for lunch now I am forwarding my notes high priority to [facility].     Reason for Disposition . [1] Taking BP medications AND [2] feels is having side effects (e.g., impotence, cough, dizzy upon standing)  Answer Assessment - Initial Assessment Questions 1. BLOOD PRESSURE: "What is the blood pressure?" "Did you take at least two measurements 5 minutes apart?"     My BP is elevated for 4 days.  This morning as soon as I got up.  Ears ringing and headaches.   It's been high for 4 days.   2. ONSET: "When did you take your blood pressure?"     Yes 3. HOW: "How did you obtain the blood pressure?" (e.g., visiting nurse, automatic home BP monitor)     She took me off of atenolol and put me on a new BP medication 4. HISTORY: "Do you have a history of high blood pressure?"     Yes 5. MEDICATIONS: "Are you taking any medications for blood pressure?" "Have you missed any doses recently?"     Yes    6 days on the new medicine. 6. OTHER SYMPTOMS: "Do you have any symptoms?" (e.g., headache, chest pain, blurred vision, difficulty breathing, weakness)     Ringing in ears all the time, headaches. 7. PREGNANCY: "Is there any chance you are pregnant?" "When was your last menstrual period?"     N/A  Protocols used: BLOOD PRESSURE - HIGH-A-AH

## 2020-10-02 NOTE — Telephone Encounter (Signed)
Patient was made aware of Dr. Levada Dy recommendations to go to ER if still symptomatic, Patient states that she was only taking 2 of the three meds for BP, informed her to start taking the Atenolol as well as the losartan and amlodipine. Patient states that she will restart the Atenolol and if still symptomatic in morning will go to the ER as recommended

## 2020-10-05 ENCOUNTER — Encounter: Payer: Self-pay | Admitting: Internal Medicine

## 2020-10-05 ENCOUNTER — Other Ambulatory Visit: Payer: Self-pay

## 2020-10-05 ENCOUNTER — Ambulatory Visit (INDEPENDENT_AMBULATORY_CARE_PROVIDER_SITE_OTHER): Payer: Medicare Other | Admitting: Internal Medicine

## 2020-10-05 VITALS — BP 137/85 | HR 53 | Temp 97.9°F | Ht 63.5 in | Wt 181.0 lb

## 2020-10-05 DIAGNOSIS — E139 Other specified diabetes mellitus without complications: Secondary | ICD-10-CM | POA: Diagnosis not present

## 2020-10-05 DIAGNOSIS — Z8262 Family history of osteoporosis: Secondary | ICD-10-CM

## 2020-10-05 DIAGNOSIS — Z1329 Encounter for screening for other suspected endocrine disorder: Secondary | ICD-10-CM

## 2020-10-05 DIAGNOSIS — N200 Calculus of kidney: Secondary | ICD-10-CM | POA: Diagnosis not present

## 2020-10-05 DIAGNOSIS — H9319 Tinnitus, unspecified ear: Secondary | ICD-10-CM

## 2020-10-05 DIAGNOSIS — E785 Hyperlipidemia, unspecified: Secondary | ICD-10-CM

## 2020-10-05 DIAGNOSIS — Z1211 Encounter for screening for malignant neoplasm of colon: Secondary | ICD-10-CM

## 2020-10-05 DIAGNOSIS — M810 Age-related osteoporosis without current pathological fracture: Secondary | ICD-10-CM

## 2020-10-05 MED ORDER — AMLODIPINE BESYLATE 2.5 MG PO TABS: 2.5000 mg | ORAL_TABLET | Freq: Every day | ORAL | 3 refills | Status: DC

## 2020-10-05 NOTE — Progress Notes (Signed)
BP 137/85   Pulse (!) 53   Temp 97.9 F (36.6 C) (Oral)   Ht 5' 3.5" (1.613 m)   Wt 181 lb (82.1 kg)   SpO2 99%   BMI 31.56 kg/m    Subjective:    Patient ID: Lisa Matthews, female    DOB: 1956/02/23, 65 y.o.   MRN: [id number]  HPI: Lisa Matthews is a 65 y.o. female  Pt is here for a fu.   Diabetes She presents for her follow-up diabetic visit. She has type 2 diabetes mellitus. Pertinent negatives for hypoglycemia include no confusion, dizziness, headaches, speech difficulty or tremors. Pertinent negatives for diabetes include no chest pain, no fatigue, no polydipsia, no polyphagia, no polyuria and no weakness.  Hypertension This is a chronic problem. The current episode started more than 1 year ago. Pertinent negatives include no chest pain, headaches, palpitations or shortness of breath.  Hyperlipidemia Pertinent negatives include no chest pain, myalgias or shortness of breath.    Chief Complaint  Patient presents with  . Diabetes  . Hypertension  . Hyperlipidemia  . Referral    Patient would like referral to pain clinic around here'    Relevant past medical, surgical, family and social history reviewed and updated as indicated. Interim medical history since our last visit reviewed. Allergies and medications reviewed and updated.  Review of Systems  Constitutional: Negative for activity change, appetite change, chills, fatigue and fever.  HENT: Negative for congestion, ear discharge, ear pain and facial swelling.   Eyes: Negative for pain and itching.  Respiratory: Negative for cough, chest tightness, shortness of breath and wheezing.   Cardiovascular: Negative for chest pain, palpitations and leg swelling.  Gastrointestinal: Negative for abdominal distention, abdominal pain, blood in stool, constipation, diarrhea, nausea and vomiting.  Endocrine: Negative for cold intolerance, heat intolerance, polydipsia, polyphagia and polyuria.  Genitourinary: Negative for  difficulty urinating, dysuria, flank pain, frequency, hematuria and urgency.  Musculoskeletal: Negative for arthralgias, gait problem, joint swelling and myalgias.  Skin: Negative for color change, rash and wound.  Neurological: Negative for dizziness, tremors, speech difficulty, weakness, light-headedness, numbness and headaches.  Hematological: Does not bruise/bleed easily.  Psychiatric/Behavioral: Negative for agitation, confusion, decreased concentration, sleep disturbance and suicidal ideas.    Per HPI unless specifically indicated above     Objective:    BP 137/85   Pulse (!) 53   Temp 97.9 F (36.6 C) (Oral)   Ht 5' 3.5" (1.613 m)   Wt 181 lb (82.1 kg)   SpO2 99%   BMI 31.56 kg/m   Wt Readings from Last 3 Encounters:  10/05/20 181 lb (82.1 kg)  09/25/20 181 lb (82.1 kg)  06/26/20 181 lb (82.1 kg)    Physical Exam Vitals and nursing note reviewed.  Constitutional:      General: She is not in acute distress.    Appearance: Normal appearance. She is not ill-appearing or diaphoretic.  HENT:     Head: Normocephalic and atraumatic.     Right Ear: Tympanic membrane and external ear normal. There is no impacted cerumen.     Left Ear: External ear normal.     Nose: No congestion or rhinorrhea.     Mouth/Throat:     Pharynx: No oropharyngeal exudate or posterior oropharyngeal erythema.  Eyes:     Conjunctiva/sclera: Conjunctivae normal.     Pupils: Pupils are equal, round, and reactive to light.  Cardiovascular:     Rate and Rhythm: Normal rate and regular rhythm.  Heart sounds: No murmur heard. No friction rub. No gallop.   Pulmonary:     Effort: No respiratory distress.     Breath sounds: No stridor. No wheezing or rhonchi.  Chest:     Chest wall: No tenderness.  Abdominal:     General: Abdomen is flat. Bowel sounds are normal. There is no distension.     Palpations: Abdomen is soft. There is no mass.     Tenderness: There is no abdominal tenderness. There is  no guarding.  Musculoskeletal:        General: No swelling or deformity.     Cervical back: Normal range of motion and neck supple. No rigidity or tenderness.     Right lower leg: No edema.     Left lower leg: No edema.  Skin:    General: Skin is warm and dry.     Coloration: Skin is not jaundiced.     Findings: No erythema.  Neurological:     Mental Status: She is alert and oriented to person, place, and time. Mental status is at baseline.  Psychiatric:        Mood and Affect: Mood normal.        Behavior: Behavior normal.        Thought Content: Thought content normal.        Judgment: Judgment normal.     Results for orders placed or performed in visit on 09/25/20  Microscopic Examination   Urine  Result Value Ref Range   WBC, UA 0-5 0 - 5 /hpf   RBC None seen 0 - 2 /hpf   Epithelial Cells (non renal) 0-10 0 - 10 /hpf   Crystals Present (A) N/A   Crystal Type Calcium Oxalate N/A   Mucus, UA Present (A) Not Estab.   Bacteria, UA Many (A) None seen/Few  CBC with Differential/Platelet  Result Value Ref Range   WBC 5.9 3.4 - 10.8 x10E3/uL   RBC 4.49 3.77 - 5.28 x10E6/uL   Hemoglobin 13.6 11.1 - 15.9 g/dL   Hematocrit 40.4 34.0 - 46.6 %   MCV 90 79 - 97 fL   MCH 30.3 26.6 - 33.0 pg   MCHC 33.7 31.5 - 35.7 g/dL   RDW 12.2 11.7 - 15.4 %   Platelets 195 150 - 450 x10E3/uL   Neutrophils 52 Not Estab. %   Lymphs 32 Not Estab. %   Monocytes 11 Not Estab. %   Eos 3 Not Estab. %   Basos 1 Not Estab. %   Neutrophils Absolute 3.2 1.4 - 7.0 x10E3/uL   Lymphocytes Absolute 1.9 0.7 - 3.1 x10E3/uL   Monocytes Absolute 0.6 0.1 - 0.9 x10E3/uL   EOS (ABSOLUTE) 0.2 0.0 - 0.4 x10E3/uL   Basophils Absolute 0.0 0.0 - 0.2 x10E3/uL   Immature Granulocytes 1 Not Estab. %   Immature Grans (Abs) 0.0 0.0 - 0.1 x10E3/uL  Comprehensive metabolic panel  Result Value Ref Range   Glucose 154 (H) 65 - 99 mg/dL   BUN 13 8 - 27 mg/dL   Creatinine, Ser 0.60 0.57 - 1.00 mg/dL   eGFR 100 >59  mL/min/1.73   BUN/Creatinine Ratio 22 12 - 28   Sodium 144 134 - 144 mmol/L   Potassium 4.7 3.5 - 5.2 mmol/L   Chloride 105 96 - 106 mmol/L   CO2 22 20 - 29 mmol/L   Calcium 10.5 (H) 8.7 - 10.3 mg/dL   Total Protein 6.7 6.0 - 8.5 g/dL   Albumin 4.1 3.8 -  4.8 g/dL   Globulin, Total 2.6 1.5 - 4.5 g/dL   Albumin/Globulin Ratio 1.6 1.2 - 2.2   Bilirubin Total 0.2 0.0 - 1.2 mg/dL   Alkaline Phosphatase 151 (H) 44 - 121 IU/L   AST 16 0 - 40 IU/L   ALT 20 0 - 32 IU/L  Lipid panel  Result Value Ref Range   Cholesterol, Total 260 (H) 100 - 199 mg/dL   Triglycerides 356 (H) 0 - 149 mg/dL   HDL 43 >39 mg/dL   VLDL Cholesterol Cal 66 (H) 5 - 40 mg/dL   LDL Chol Calc (NIH) 151 (H) 0 - 99 mg/dL   Chol/HDL Ratio 6.0 (H) 0.0 - 4.4 ratio  TSH  Result Value Ref Range   TSH 2.250 0.450 - 4.500 uIU/mL  Urinalysis, Routine w reflex microscopic  Result Value Ref Range   Specific Gravity, UA 1.025 1.005 - 1.030   pH, UA 5.5 5.0 - 7.5   Color, UA Yellow Yellow   Appearance Ur Cloudy (A) Clear   Leukocytes,UA 1+ (A) Negative   Protein,UA Negative Negative/Trace   Glucose, UA Trace (A) Negative   Ketones, UA Negative Negative   RBC, UA Negative Negative   Bilirubin, UA Negative Negative   Urobilinogen, Ur 0.2 0.2 - 1.0 mg/dL   Nitrite, UA Negative Negative   Microscopic Examination See below:   Lipase  Result Value Ref Range   Lipase 36 14 - 72 U/L  Bayer DCA Hb A1c Waived  Result Value Ref Range   HB A1C (BAYER DCA - WAIVED) 8.3 (H) <7.0 %        Current Outpatient Medications:  .  acetaminophen (TYLENOL) 650 MG CR tablet, Take by mouth., Disp: , Rfl:  .  atenolol (TENORMIN) 50 MG tablet, Take by mouth., Disp: , Rfl:  .  Baclofen 5 MG TABS, Take 1 tablet by mouth 2 (two) times daily as needed., Disp: , Rfl:  .  Biotin w/ Vitamins C & E (HAIR/SKIN/NAILS PO), Take by mouth., Disp: , Rfl:  .  Calcium Carbonate-Vitamin D 600-400 MG-UNIT tablet, Take 1 tablet by mouth daily., Disp: , Rfl:   .  Cholecalciferol 25 MCG (1000 UT) tablet, Take by mouth., Disp: , Rfl:  .  cyanocobalamin 1000 MCG tablet, Take by mouth., Disp: , Rfl:  .  dapagliflozin propanediol (FARXIGA) 5 MG TABS tablet, Take 1 tablet (5 mg total) by mouth daily before breakfast., Disp: 30 tablet, Rfl: 6 .  ELDERBERRY PO, Take by mouth., Disp: , Rfl:  .  fenofibrate (TRICOR) 145 MG tablet, Take 1 tablet (145 mg total) by mouth daily., Disp: 30 tablet, Rfl: 6 .  gabapentin (NEURONTIN) 300 MG capsule, Take by mouth., Disp: , Rfl:  .  ibuprofen (ADVIL) 600 MG tablet, Take 600 mg by mouth 2 (two) times daily as needed., Disp: , Rfl:  .  insulin glargine (LANTUS) 100 UNIT/ML injection, Inject into the skin., Disp: , Rfl:  .  Lancets MISC, Frequency:PRN   Dosage:0.0     Instructions:  Note:Dose: 1, Disp: , Rfl:  .  losartan (COZAAR) 25 MG tablet, Take 1 tablet (25 mg total) by mouth daily., Disp: 30 tablet, Rfl: 3 .  metFORMIN (GLUCOPHAGE) 500 MG tablet, Take 1 tablet (500 mg total) by mouth 2 (two) times daily with a meal., Disp: 60 tablet, Rfl: 3 .  mirtazapine (REMERON) 30 MG tablet, Take by mouth., Disp: , Rfl:  .  omeprazole (PRILOSEC) 40 MG capsule, Take 1 capsule (40 mg  total) by mouth daily., Disp: 30 capsule, Rfl: 3 .  oxyCODONE (OXY IR/ROXICODONE) 5 MG immediate release tablet, Take 5 mg by mouth 2 (two) times daily as needed., Disp: , Rfl:  .  Teriparatide, Recombinant, 620 MCG/2.48ML SOPN, Inject into the skin., Disp: , Rfl:  .  tiZANidine (ZANAFLEX) 4 MG tablet, Take by mouth., Disp: , Rfl:  .  venlafaxine XR (EFFEXOR-XR) 150 MG 24 hr capsule, Take by mouth., Disp: , Rfl:  .  vitamin C (ASCORBIC ACID) 500 MG tablet, Take 500 mg by mouth daily., Disp: , Rfl:  .  zinc gluconate 50 MG tablet, Take 50 mg by mouth daily., Disp: , Rfl:  .  amLODipine (NORVASC) 2.5 MG tablet, Take 1 tablet (2.5 mg total) by mouth daily. (Patient not taking: Reported on 10/05/2020), Disp: 30 tablet, Rfl: 0    Assessment & Plan:  1.  Dm check HbA1c,  urine  microalbumin  diabetic diet plan given to pt  adviced regarding hypoglycemia and instructions given to pt today on how to prevent and treat the same if it were to occur. pt acknowledges the plan and voices understanding of the same.  exercise plan given and encouraged.   advice diabetic yearly podiatry, ophthalmology , nutritionist , dental check q 6 months,   2. HTN : restart on amlodipine , losartan , atenolol  Continue current meds.  Medication compliance emphasised. pt advised to keep Bp logs. Pt verbalised understanding of the same. Pt to have a low salt diet . Exercise to reach a goal of at least 150 mins a week.  lifestyle modifications explained and pt understands importance of the above.  3. Intermittent abdominal pain  Ct abdomen and pelvis :  IMPRESSION: 1. No acute finding by CT. Previous bariatric surgery with Roux-en-Y configuration. No complicating feature seen related to that. 2. Mild fatty change of the liver. 3. Previous cholecystectomy. 4. Aortic atherosclerosis. 5. Nonobstructing 9 mm stone in the lower pole of the right kidney. 6. These results will be called to the ordering clinician or representative by the Radiologist Assistant, and communication documented in the PACS or [employer].  Problem List Items Addressed This Visit      Musculoskeletal and Integument   Postmenopausal osteoporosis     Other   Family history of osteoporosis   Hyperlipidemia   Relevant Medications   amLODipine (NORVASC) 2.5 MG tablet    Other Visit Diagnoses    Diabetes mellitus of other type without complication, unspecified whether long term insulin use ([place])    -  Primary   Relevant Orders   Ambulatory referral to Pain Clinic   Comprehensive metabolic panel   Bayer [place] Hb A1c Waived   Thyroid Panel With TSH   CBC with Differential/Platelet   Lipid panel   Ambulatory referral to ENT   Tinnitus, unspecified laterality       Nephrolithiasis        Relevant Orders   Ambulatory referral to Urology   Screen for colon cancer       Relevant Orders   Ambulatory referral to Gastroenterology   Thyroid disorder screening           Follow up plan: Return in about 3 months (around 01/05/2021).

## 2020-10-09 ENCOUNTER — Other Ambulatory Visit: Payer: Self-pay | Admitting: Internal Medicine

## 2020-10-09 MED ORDER — GABAPENTIN 300 MG PO CAPS: 300.0000 mg | ORAL_CAPSULE | Freq: Two times a day (BID) | ORAL | 3 refills | Status: DC

## 2020-10-09 NOTE — Progress Notes (Signed)
10/10/2020 10:29 AM   Lisa Matthews [birth year] [id number]  Referring provider: Charlynne Cousins, MD [address] [place],  [name] [address]  Chief Complaint  Patient presents with   Nephrolithiasis    HPI: 65 year old who presents today for further evaluation of incidental 9 mm right lower pole kidney stone.  She is experiencing upper abdominal and epigastric pain for the past month.  She underwent CT abdomen pelvis with contrast on 09/26/2020 indicating the above incidental finding.  She does have a personal history of gastric bypass status post Roux-en-Y.  She reports that she has a known personal history of kidney stones.  She was treated in [place] for these by [facility].  We do not have access to records from this practice.  She underwent ureteroscopy sometime ago and also has a known stone but was uncertain of laterality or size.  This was several years ago.   HFU ~900, SSD 8   No other flank pain other than the aforementioned episode of epigastric pain.  He does mention today that she is had some mild dysuria that recently started.  Her urinalysis today is suspicious for infection.  PMH: Past Medical History:  Diagnosis Date   Allergy    Anxiety    Arthritis    COPD (chronic obstructive pulmonary disease) ([place])    Depression    Diabetes mellitus without complication (HCC)    Heart murmur    Hypertension    Kidney stones    Osteoporosis     Surgical History: Past Surgical History:  Procedure Laterality Date   CESAREAN SECTION     CYSTOSCOPY/URETEROSCOPY/HOLMIUM LASER/STENT PLACEMENT     FRACTURE SURGERY     JOINT REPLACEMENT Right    knee   TUBAL LIGATION      Home Medications:  Allergies as of 10/10/2020       Reactions   Sitagliptin Anaphylaxis   Atorvastatin Other (See Comments), Rash   Pain Pain Pain Pain   Bupropion Other (See Comments)   "Make me delirious" "Make me delirious" "Make me delirious" "Make me delirious"   Nitrofurantoin  Macrocrystal Rash   Other Hives   Pravastatin Other (See Comments)   Ciprofloxacin Other (See Comments)   Patient does not know Patient does not know   Exenatide Rash   Ezetimibe Rash   Fluconazole Rash   Morphine Rash   Penicillins Rash   Prednisone Rash   Simvastatin Rash   Sulfa Antibiotics Rash   Trazodone Rash        Medication List        Accurate as of Oct 10, 2020 11:59 PM. If you have any questions, ask your nurse or doctor.          STOP taking these medications    Baclofen 5 MG Tabs Stopped by: Hollice Espy, MD       TAKE these medications    acetaminophen 650 MG CR tablet Commonly known as: TYLENOL Take by mouth as needed.   amLODipine 2.5 MG tablet Commonly known as: NORVASC Take 1 tablet (2.5 mg total) by mouth daily.   amoxicillin 875 MG tablet Commonly known as: AMOXIL Take 1 tablet (875 mg total) by mouth every 12 (twelve) hours. Started by: Hollice Espy, MD   atenolol 50 MG tablet Commonly known as: TENORMIN Take 50 mg by mouth daily.   Calcium Carbonate-Vitamin D 600-400 MG-UNIT tablet Take 1 tablet by mouth daily.   Cholecalciferol 25 MCG (1000 UT) tablet Take 1,000 Units by mouth  daily.   cyanocobalamin 1000 MCG tablet Take 1,000 mcg by mouth daily.   dapagliflozin propanediol 5 MG Tabs tablet Commonly known as: Farxiga Take 1 tablet (5 mg total) by mouth daily before breakfast.   ELDERBERRY PO Take by mouth daily.   fenofibrate 145 MG tablet Commonly known as: Tricor Take 1 tablet (145 mg total) by mouth daily.   gabapentin 300 MG capsule Commonly known as: NEURONTIN Take 1 capsule (300 mg total) by mouth 2 (two) times daily.   HAIR/SKIN/NAILS PO Take by mouth daily.   ibuprofen 600 MG tablet Commonly known as: ADVIL Take 600 mg by mouth 2 (two) times daily as needed.   insulin glargine 100 UNIT/ML injection Commonly known as: LANTUS Inject 20 Units into the skin daily.   Lancets Misc Frequency:PRN    Dosage:0.0     Instructions:  Note:Dose: 1   losartan 25 MG tablet Commonly known as: COZAAR Take 1 tablet (25 mg total) by mouth daily.   metFORMIN 500 MG tablet Commonly known as: GLUCOPHAGE Take 1 tablet (500 mg total) by mouth 2 (two) times daily with a meal.   mirtazapine 30 MG tablet Commonly known as: REMERON Take 30 mg by mouth.   omeprazole 40 MG capsule Commonly known as: PRILOSEC Take 1 capsule (40 mg total) by mouth daily.   oxyCODONE 5 MG immediate release tablet Commonly known as: Oxy IR/ROXICODONE Take 5 mg by mouth 2 (two) times daily as needed.   Teriparatide (Recombinant) 620 MCG/2.48ML Sopn Inject into the skin at bedtime.   tiZANidine 4 MG tablet Commonly known as: ZANAFLEX Take 4 mg by mouth at bedtime.   venlafaxine XR 150 MG 24 hr capsule Commonly known as: EFFEXOR-XR Take 150 mg by mouth daily with breakfast.   vitamin C 500 MG tablet Commonly known as: ASCORBIC ACID Take 500 mg by mouth daily.   zinc gluconate 50 MG tablet Take 50 mg by mouth daily.        Allergies:  Allergies  Allergen Reactions   Sitagliptin Anaphylaxis   Atorvastatin Other (See Comments) and Rash    Pain Pain Pain Pain    Bupropion Other (See Comments)    "Make me delirious" "Make me delirious" "Make me delirious" "Make me delirious"    Nitrofurantoin Macrocrystal Rash   Other Hives   Pravastatin Other (See Comments)   Ciprofloxacin Other (See Comments)    Patient does not know Patient does not know    Exenatide Rash   Ezetimibe Rash   Fluconazole Rash   Morphine Rash   Penicillins Rash   Prednisone Rash   Simvastatin Rash   Sulfa Antibiotics Rash   Trazodone Rash    Family History: Family History  Problem Relation Age of Onset   Heart attack Mother 26   Emphysema Father    Heart attack Father 26   Heart disease Father    Cancer Sister     Social History:  reports that she has been smoking e-cigarettes. She has never used smokeless  tobacco. She reports that she does not drink alcohol and does not use drugs.   Physical Exam: BP 131/82   Pulse (!) 59   Ht 5' 3.5" (1.613 m)   Wt 181 lb (82.1 kg)   BMI 31.56 kg/m   Constitutional:  Alert and oriented, No acute distress. HEENT: [place] AT, moist mucus membranes.  Trachea midline, no masses. Cardiovascular: No clubbing, cyanosis, or edema. Respiratory: Normal respiratory effort, no increased work of breathing. Skin: No  rashes, bruises or suspicious lesions. Neurologic: Grossly intact, no focal deficits, moving all 4 extremities. Psychiatric: Normal mood and affect.  Laboratory Data: Lab Results  Component Value Date   WBC 5.9 09/25/2020   HGB 13.6 09/25/2020   HCT 40.4 09/25/2020   MCV 90 09/25/2020   PLT 195 09/25/2020    Lab Results  Component Value Date   CREATININE 0.60 09/25/2020    Lab Results  Component Value Date   HGBA1C 8.3 (H) 09/25/2020    Urinalysis Component     Latest Ref Rng & Units 10/10/2020  Specific Gravity, UA     1.005 - 1.030 1.015  pH, UA     5.0 - 7.5 5.5  Color, UA     Yellow Yellow  Appearance Ur     Clear Cloudy (A)  Leukocytes,UA     Negative Trace (A)  Protein,UA     Negative/Trace Negative  Glucose, UA     Negative 1+ (A)  Ketones, UA     Negative Negative  RBC, UA     Negative Negative  Bilirubin, UA     Negative Negative  Urobilinogen, Ur     0.2 - 1.0 mg/dL 0.2  Nitrite, UA     Negative Positive (A)  Microscopic Examination      See below:    Pertinent Imaging: CLINICAL DATA:  Upper abdominal and epigastric pain over the last month. Previous bariatric surgery.   EXAM: CT ABDOMEN AND PELVIS WITH CONTRAST   TECHNIQUE: Multidetector CT imaging of the abdomen and pelvis was performed using the standard protocol following bolus administration of intravenous contrast.   CONTRAST:  138mL OMNIPAQUE IOHEXOL 300 MG/ML  SOLN   COMPARISON:  None.   FINDINGS: Lower chest: Lung bases are clear.  No  pleural or pericardial fluid.   Hepatobiliary: Previous cholecystectomy. Liver parenchyma shows mild fatty change without focal lesion.   Pancreas: Normal   Spleen: Normal   Adrenals/Urinary Tract: Adrenal glands are normal. Left kidney is normal. No cyst, mass, stone or hydronephrosis. Right kidney shows mild atrophy of the lower pole with a nonobstructing 9 mm stone. No stone in either ureter. Bladder is normal.   Stomach/Bowel: Previous bariatric surgery with Roux-en-Y configuration. No complicating feature seen related to that. No other intestinal abnormality seen. No obstruction. No inflammatory change.   Vascular/Lymphatic: Aortic atherosclerosis. No aneurysm. IVC is normal. No adenopathy.   Reproductive: Normal.  No pelvic mass.   Other: No free fluid or air.   Musculoskeletal: Ordinary lower lumbar degenerative changes. No evidence of abdominal wall hernia.   IMPRESSION: 1. No acute finding by CT. Previous bariatric surgery with Roux-en-Y configuration. No complicating feature seen related to that. 2. Mild fatty change of the liver. 3. Previous cholecystectomy. 4. Aortic atherosclerosis. 5. Nonobstructing 9 mm stone in the lower pole of the right kidney. 6. These results will be called to the ordering clinician or representative by the Radiologist Assistant, and communication documented in the PACS or [employer].   Aortic Atherosclerosis (ICD10-I70.0).     Electronically Signed   By: Nelson Chimes M.D.   On: 09/26/2020 11:26  CT of the abdomen pelvis was personally reviewed today as outlined above.  Agree with radiological interpretation.  Nonobstructing 9 mm stone with parameters as outlined above.   Assessment & Plan:    1. Nephrolithiasis Incidental nonobstructing 9 mm right lower pole stone  Above presentation likely unrelated to stone which is an incidental finding  Based on her history,  it appears that she has had the stone for quite some  time, unclear with the growth rate is as well as chronicity of the stone.  We will request records from [facility] to help guide her management in terms of whether or not to treat the stone or continue observation  We did discuss various treatment options should she like to have this treated including ureteroscopy, shockwave lithotripsy versus observation.  She is a good candidate for any of these treatment modalities.  We will have her follow-up in a month with records to further this discussion. - Urinalysis, Complete - CULTURE, URINE COMPREHENSIVE  2. Acute cystitis without hematuria Analysis today is frankly positive with mild urinary symptoms  We will go ahead and treat her and adjust as needed based on culture and sensitivity data  Do not suspect that lower urinary tract symptoms are likely related to nonobstructing stone continue to reassess, if she continues to have recurrent issues with infection, may consider stone treatment  Follow-up in 1 month with records  Hollice Espy, MD  [facility] [address] [place], [place] [address] [phone]

## 2020-10-09 NOTE — Telephone Encounter (Signed)
Medication Refill - Medication: Gabapentin  Has the patient contacted their pharmacy? Yes.   Pt states that she has no refills at pharmacy. Pt states that the provider who prescribed this medication is too far away and is requesting to have refilled by PCP. Please advise.  (Agent: If no, request that the patient contact the pharmacy for the refill.) (Agent: If yes, when and what did the pharmacy advise?)  Preferred Pharmacy (with phone number or street name):  [facility] [address]  [place] ([place])  [address]  Phone: [phone] Fax: [phone]  Hours: Not open 24 hours    Agent: Please be advised that RX refills may take up to 3 business days. We ask that you follow-up with your pharmacy.

## 2020-10-09 NOTE — Telephone Encounter (Signed)
Please read note. Pt last RF of this med was 09/25/20.

## 2020-10-09 NOTE — Telephone Encounter (Signed)
Please advise 

## 2020-10-10 ENCOUNTER — Ambulatory Visit (INDEPENDENT_AMBULATORY_CARE_PROVIDER_SITE_OTHER): Payer: Medicare Other | Admitting: Urology

## 2020-10-10 ENCOUNTER — Encounter: Payer: Self-pay | Admitting: Urology

## 2020-10-10 ENCOUNTER — Other Ambulatory Visit: Payer: Self-pay

## 2020-10-10 VITALS — BP 131/82 | HR 59 | Ht 63.5 in | Wt 181.0 lb

## 2020-10-10 DIAGNOSIS — N200 Calculus of kidney: Secondary | ICD-10-CM | POA: Diagnosis not present

## 2020-10-10 DIAGNOSIS — N3000 Acute cystitis without hematuria: Secondary | ICD-10-CM

## 2020-10-10 MED ORDER — AMOXICILLIN 875 MG PO TABS: 875.0000 mg | ORAL_TABLET | Freq: Two times a day (BID) | ORAL | 0 refills | Status: DC

## 2020-10-11 LAB — URINALYSIS, COMPLETE
Bilirubin, UA: NEGATIVE
Ketones, UA: NEGATIVE
Nitrite, UA: POSITIVE — AB
Protein,UA: NEGATIVE
RBC, UA: NEGATIVE
Specific Gravity, UA: 1.015 (ref 1.005–1.030)
Urobilinogen, Ur: 0.2 mg/dL (ref 0.2–1.0)
pH, UA: 5.5 (ref 5.0–7.5)

## 2020-10-11 LAB — MICROSCOPIC EXAMINATION

## 2020-10-16 LAB — CULTURE, URINE COMPREHENSIVE

## 2020-10-17 ENCOUNTER — Telehealth: Payer: Self-pay | Admitting: *Deleted

## 2020-10-17 NOTE — Telephone Encounter (Addendum)
Left VM asked to return call  ----- Message from Hollice Espy, MD sent at 10/16/2020  9:25 PM EDT ----- This patient ended up growing resistant organism.  Its possible that the meds prescribed were not sufficient.  Is she feeling better?   We can either retest her urine with repeat Ua/ UCx or we can treat with alternative Augmentin twice daily x 5 days.  Hollice Espy, MD

## 2020-10-18 ENCOUNTER — Other Ambulatory Visit: Payer: Self-pay

## 2020-10-18 ENCOUNTER — Encounter: Payer: Self-pay | Admitting: Internal Medicine

## 2020-10-18 ENCOUNTER — Ambulatory Visit (INDEPENDENT_AMBULATORY_CARE_PROVIDER_SITE_OTHER): Payer: Medicare Other | Admitting: Internal Medicine

## 2020-10-18 VITALS — BP 134/82 | HR 66 | Temp 97.7°F | Ht 63.5 in | Wt 180.2 lb

## 2020-10-18 DIAGNOSIS — R309 Painful micturition, unspecified: Secondary | ICD-10-CM

## 2020-10-18 DIAGNOSIS — R8271 Bacteriuria: Secondary | ICD-10-CM | POA: Diagnosis not present

## 2020-10-18 LAB — URINALYSIS, ROUTINE W REFLEX MICROSCOPIC
Bilirubin, UA: NEGATIVE
Ketones, UA: NEGATIVE
Leukocytes,UA: NEGATIVE
Nitrite, UA: POSITIVE — AB
Protein,UA: NEGATIVE
Specific Gravity, UA: 1.02 (ref 1.005–1.030)
Urobilinogen, Ur: 0.2 mg/dL (ref 0.2–1.0)
pH, UA: 5.5 (ref 5.0–7.5)

## 2020-10-18 LAB — MICROSCOPIC EXAMINATION: RBC, Urine: NONE SEEN /hpf (ref 0–2)

## 2020-10-18 MED ORDER — CEPHALEXIN 500 MG PO CAPS: 500.0000 mg | ORAL_CAPSULE | Freq: Two times a day (BID) | ORAL | 0 refills | Status: DC

## 2020-10-18 NOTE — Progress Notes (Signed)
BP 134/82   Pulse 66   Temp 97.7 F (36.5 C) (Oral)   Ht 5' 3.5" (1.613 m)   Wt 180 lb 3.2 oz (81.7 kg)   SpO2 99%   BMI 31.42 kg/m    Subjective:    Patient ID: Lisa Matthews, female    DOB: December 11, 1955, 65 y.o.   MRN: [id number]  HPI: Lisa Matthews is a 65 y.o. female  Urinary Frequency  This is a new (pt co burning, frequency and itcing and lower back apin bil) problem. The quality of the pain is described as burning. Associated symptoms include frequency.    Chief Complaint  Patient presents with  . Urinary Frequency  . painful urination    All since 5/10 when seen by nephrology for kidney stone, was given Amoxicillin for UTI at that time, has taken all abx, Patient now states that the UTI has gotten much worse since yesterday.    Relevant past medical, surgical, family and social history reviewed and updated as indicated. Interim medical history since our last visit reviewed. Allergies and medications reviewed and updated.  Review of Systems  Genitourinary: Positive for frequency.    Per HPI unless specifically indicated above     Objective:    BP 134/82   Pulse 66   Temp 97.7 F (36.5 C) (Oral)   Ht 5' 3.5" (1.613 m)   Wt 180 lb 3.2 oz (81.7 kg)   SpO2 99%   BMI 31.42 kg/m   Wt Readings from Last 3 Encounters:  10/18/20 180 lb 3.2 oz (81.7 kg)  10/10/20 181 lb (82.1 kg)  10/05/20 181 lb (82.1 kg)    Physical Exam Constitutional:      Appearance: Normal appearance. She is obese.  Musculoskeletal:        General: No swelling, tenderness, deformity or signs of injury.     Right lower leg: No edema.     Left lower leg: No edema.  Skin:    General: Skin is warm.  Neurological:     Mental Status: She is alert.  Psychiatric:        Mood and Affect: Mood normal.        Behavior: Behavior normal.     Results for orders placed or performed in visit on 10/10/20  CULTURE, URINE COMPREHENSIVE   Specimen: Urine   UR  Result Value Ref Range    Urine Culture, Comprehensive Final report (A)    Organism ID, Bacteria Escherichia coli (A)    ANTIMICROBIAL SUSCEPTIBILITY Comment   Microscopic Examination   Urine  Result Value Ref Range   WBC, UA 11-30 (A) 0 - 5 /hpf   RBC 0-2 0 - 2 /hpf   Epithelial Cells (non renal) 0-10 0 - 10 /hpf   Crystals Present (A) N/A   Crystal Type Calcium Oxalate N/A   Bacteria, UA Many (A) None seen/Few  Urinalysis, Complete  Result Value Ref Range   Specific Gravity, UA 1.015 1.005 - 1.030   pH, UA 5.5 5.0 - 7.5   Color, UA Yellow Yellow   Appearance Ur Cloudy (A) Clear   Leukocytes,UA Trace (A) Negative   Protein,UA Negative Negative/Trace   Glucose, UA 1+ (A) Negative   Ketones, UA Negative Negative   RBC, UA Negative Negative   Bilirubin, UA Negative Negative   Urobilinogen, Ur 0.2 0.2 - 1.0 mg/dL   Nitrite, UA Positive (A) Negative   Microscopic Examination See below:         Current  Outpatient Medications:  .  acetaminophen (TYLENOL) 650 MG CR tablet, Take by mouth., Disp: , Rfl:  .  amLODipine (NORVASC) 2.5 MG tablet, Take 1 tablet (2.5 mg total) by mouth daily., Disp: 30 tablet, Rfl: 3 .  atenolol (TENORMIN) 50 MG tablet, Take by mouth., Disp: , Rfl:  .  Biotin w/ Vitamins C & E (HAIR/SKIN/NAILS PO), Take by mouth., Disp: , Rfl:  .  Calcium Carbonate-Vitamin D 600-400 MG-UNIT tablet, Take 1 tablet by mouth daily., Disp: , Rfl:  .  Cholecalciferol 25 MCG (1000 UT) tablet, Take by mouth., Disp: , Rfl:  .  cyanocobalamin 1000 MCG tablet, Take by mouth., Disp: , Rfl:  .  dapagliflozin propanediol (FARXIGA) 5 MG TABS tablet, Take 1 tablet (5 mg total) by mouth daily before breakfast., Disp: 30 tablet, Rfl: 6 .  ELDERBERRY PO, Take by mouth., Disp: , Rfl:  .  fenofibrate (TRICOR) 145 MG tablet, Take 1 tablet (145 mg total) by mouth daily., Disp: 30 tablet, Rfl: 6 .  gabapentin (NEURONTIN) 300 MG capsule, Take 1 capsule (300 mg total) by mouth 2 (two) times daily., Disp: 60 capsule, Rfl:  3 .  ibuprofen (ADVIL) 600 MG tablet, Take 600 mg by mouth 2 (two) times daily as needed., Disp: , Rfl:  .  insulin glargine (LANTUS) 100 UNIT/ML injection, Inject into the skin., Disp: , Rfl:  .  Lancets MISC, Frequency:PRN   Dosage:0.0     Instructions:  Note:Dose: 1, Disp: , Rfl:  .  losartan (COZAAR) 25 MG tablet, Take 1 tablet (25 mg total) by mouth daily., Disp: 30 tablet, Rfl: 3 .  metFORMIN (GLUCOPHAGE) 500 MG tablet, Take 1 tablet (500 mg total) by mouth 2 (two) times daily with a meal., Disp: 60 tablet, Rfl: 3 .  mirtazapine (REMERON) 30 MG tablet, Take by mouth., Disp: , Rfl:  .  omeprazole (PRILOSEC) 40 MG capsule, Take 1 capsule (40 mg total) by mouth daily., Disp: 30 capsule, Rfl: 3 .  oxyCODONE (OXY IR/ROXICODONE) 5 MG immediate release tablet, Take 5 mg by mouth 2 (two) times daily as needed., Disp: , Rfl:  .  Teriparatide, Recombinant, 620 MCG/2.48ML SOPN, Inject into the skin., Disp: , Rfl:  .  tiZANidine (ZANAFLEX) 4 MG tablet, Take by mouth., Disp: , Rfl:  .  venlafaxine XR (EFFEXOR-XR) 150 MG 24 hr capsule, Take by mouth., Disp: , Rfl:  .  vitamin C (ASCORBIC ACID) 500 MG tablet, Take 500 mg by mouth daily., Disp: , Rfl:  .  zinc gluconate 50 MG tablet, Take 50 mg by mouth daily., Disp: , Rfl:     Assessment & Plan:  1. Recurrent UTI ; Will start pt on keflex x 7 day To touch base with urology    Problem List Items Addressed This Visit   None   Visit Diagnoses    Painful urination    -  Primary   Relevant Orders   Urinalysis, Routine w reflex microscopic       Follow up plan: No follow-ups on file.

## 2020-10-19 ENCOUNTER — Telehealth: Payer: Self-pay | Admitting: *Deleted

## 2020-10-19 NOTE — Telephone Encounter (Addendum)
Left Vm to return call  ----- Message from Hollice Espy, MD sent at 10/16/2020  9:25 PM EDT ----- This patient ended up growing resistant organism.  Its possible that the meds prescribed were not sufficient.  Is she feeling better?   We can either retest her urine with repeat Ua/ UCx or we can treat with alternative Augmentin twice daily x 5 days.  Hollice Espy, MD

## 2020-10-20 ENCOUNTER — Ambulatory Visit: Payer: Medicare Other | Admitting: Internal Medicine

## 2020-10-23 ENCOUNTER — Other Ambulatory Visit: Payer: Self-pay | Admitting: Internal Medicine

## 2020-10-23 LAB — URINE CULTURE

## 2020-10-23 MED ORDER — DOXYCYCLINE HYCLATE 100 MG PO TABS: 100.0000 mg | ORAL_TABLET | Freq: Two times a day (BID) | ORAL | 0 refills | Status: AC

## 2020-10-25 ENCOUNTER — Telehealth: Payer: Self-pay | Admitting: Internal Medicine

## 2020-10-25 NOTE — Telephone Encounter (Signed)
Order form for meter and supplies filled out and placed in providers folder for signature.

## 2020-10-25 NOTE — Telephone Encounter (Signed)
Medication Refill - Medication: [place] glucose kit  Has the patient contacted their pharmacy? No.   Preferred Pharmacy  [facility] [place] [address]  [place]) [place] [address]  Phone: [phone] Fax: [phone]          Agent: Please be advised that RX refills may take up to 3 business days. We ask that you follow-up with your pharmacy.

## 2020-10-26 ENCOUNTER — Other Ambulatory Visit: Payer: Self-pay

## 2020-10-26 ENCOUNTER — Ambulatory Visit (INDEPENDENT_AMBULATORY_CARE_PROVIDER_SITE_OTHER): Payer: Medicare Other | Admitting: Internal Medicine

## 2020-10-26 ENCOUNTER — Encounter: Payer: Self-pay | Admitting: Internal Medicine

## 2020-10-26 VITALS — BP 140/82 | HR 54 | Ht 64.0 in | Wt 180.0 lb

## 2020-10-26 DIAGNOSIS — E785 Hyperlipidemia, unspecified: Secondary | ICD-10-CM

## 2020-10-26 DIAGNOSIS — I951 Orthostatic hypotension: Secondary | ICD-10-CM

## 2020-10-26 DIAGNOSIS — R0789 Other chest pain: Secondary | ICD-10-CM | POA: Diagnosis not present

## 2020-10-26 DIAGNOSIS — E1169 Type 2 diabetes mellitus with other specified complication: Secondary | ICD-10-CM

## 2020-10-26 DIAGNOSIS — I10 Essential (primary) hypertension: Secondary | ICD-10-CM

## 2020-10-26 DIAGNOSIS — R079 Chest pain, unspecified: Secondary | ICD-10-CM

## 2020-10-26 MED ORDER — ASPIRIN EC 81 MG PO TBEC: 81.0000 mg | DELAYED_RELEASE_TABLET | Freq: Every day | ORAL | 3 refills | Status: DC

## 2020-10-26 NOTE — Patient Instructions (Addendum)
Medication Instructions:  Your physician has recommended you make the following change in your medication:   1. START Aspirin 81 mg once daily   *If you need a refill on your cardiac medications before your next appointment, please call your pharmacy*   Lab Work: None  If you have labs (blood work) drawn today and your tests are completely normal, you will receive your results only by: Marland Kitchen MyChart Message (if you have MyChart) OR . A paper copy in the mail If you have any lab test that is abnormal or we need to change your treatment, we will call you to review the results.   Testing/Procedures: None   Follow-Up: At [facility], you and your health needs are our priority.  As part of our continuing mission to provide you with exceptional heart care, we have created designated Provider Care Teams.  These Care Teams include your primary Cardiologist (physician) and Advanced Practice Providers (APPs -  Physician Assistants and Nurse Practitioners) who all work together to provide you with the care you need, when you need it.   Your next appointment:   3 month(s)  The format for your next appointment:   In Person  Provider:   You may see Dr. Harrell Gave End or one of the following Advanced Practice Providers on your designated Care Team:    Murray Hodgkins, NP  Christell Faith, PA-C  Marrianne Mood, PA-C  Cadence Piermont, [place]  Laurann Montana, NP

## 2020-10-26 NOTE — Progress Notes (Signed)
New Outpatient Visit Date: 10/26/2020  Referring Provider: Charlynne Cousins, MD [address] [place],  [place] [address]  Chief Complaint: Establish cardiology care  HPI:  Lisa Matthews is a 65 y.o. female who is being seen today for the evaluation of chest/epigastric pain with history of questionable coronary artery disease, hypertension, type 2 diabetes mellitus, and COPD at the request of Dr. Neomia Dear.  She was most recently followed at [facility] cardiology and internal medicine in [place], [place].  She reports having been told that she had a heart attack in the past, though she has undergone stress testing at least twice (most recently in 11/2019, without evidence of ischemia or scar.  [name] recently relocated to the [facility] area and presents to establish care today.  Her main complaints today are of ringing in her ears that has worsened since recently being started on fenofibrate.  She is scheduled for ENT evaluation.  She also reports sharp right-sided chest pain that occurs when she is under stress.  It typically only last for a few seconds with an intensity of 8/10.  It is often associated with palpitations.  She does not have any exertional chest pain.  She denies shortness of breath, lightheadedness, edema, orthopnea, and PND.  Dr. Levada Dy note makes note of prior PCI, though the patient denies ever having had a stent.  She reports a catheterization about 15 years ago, which she believes was normal.  [name] reports a syncopal episode last July during which she broke her right femur.  She had undergone cholecystectomy 2 days earlier and was told that her syncope was likely due to overmedication.  She reports significant weight loss, having lost about 150 pounds since 2013.  In the last year, she has lost 20 pounds (she is happy about this but reports that it has been unintentional).  She does not take aspirin, as she was told many years ago by a doctor that it would not be beneficial for  her.  She has also been intolerant of multiple statins including atorvastatin, pravastatin, and simvastatin, due to severe myalgias.  This prompted recent addition of fenofibrate.  --------------------------------------------------------------------------------------------------  Cardiovascular History & Procedures: Cardiovascular Problems:  Coronary artery disease  Risk Factors:  Known coronary artery disease, hypertension, hyperlipidemia, type 2 diabetes mellitus, family history and obesity  Cath/PCI:  Patient reports normal catheterization 15 years ago.  CV Surgery:  None  EP Procedures and Devices:  None  Non-Invasive Evaluation(s):  Pharmacologic MPI (11/15/2019, [facility] cardiology and internal medicine): Normal myocardial perfusion without ischemia or scar.  LVEF 63%.  TTE (05/29/2017, [facility]): Normal LV size and wall thickness.  LVEF 55-60% with grade 1 diastolic dysfunction.  Normal RV size and function.  No significant valvular abnormality.  Pharmacologic MPI (10/31/2016, [facility]): No evidence of ischemia or scar.  LVEF 53%.  Low risk study.  Recent CV Pertinent Labs: Lab Results  Component Value Date   CHOL 260 (H) 09/25/2020   HDL 43 09/25/2020   LDLCALC 151 (H) 09/25/2020   TRIG 356 (H) 09/25/2020   CHOLHDL 6.0 (H) 09/25/2020   K 4.7 09/25/2020   BUN 13 09/25/2020   CREATININE 0.60 09/25/2020    --------------------------------------------------------------------------------------------------  Past Medical History:  Diagnosis Date  . Allergy   . Anxiety   . Arthritis   . COPD (chronic obstructive pulmonary disease) ([place])   . Depression   . Diabetes mellitus without complication ([place])   . Heart murmur   . Hypertension   .  Kidney stones   . Osteoporosis     Past Surgical History:  Procedure Laterality Date  . CESAREAN SECTION    . CYSTOSCOPY/URETEROSCOPY/HOLMIUM LASER/STENT PLACEMENT    . FRACTURE SURGERY    . JOINT REPLACEMENT Right     knee  . TUBAL LIGATION      Current Meds  Medication Sig  . acetaminophen (TYLENOL) 650 MG CR tablet Take by mouth as needed.  Marland Kitchen amLODipine (NORVASC) 2.5 MG tablet Take 1 tablet (2.5 mg total) by mouth daily.  Marland Kitchen atenolol (TENORMIN) 50 MG tablet Take 50 mg by mouth daily.  . Biotin w/ Vitamins C & E (HAIR/SKIN/NAILS PO) Take by mouth daily.  . Calcium Carbonate-Vitamin D 600-400 MG-UNIT tablet Take 1 tablet by mouth daily.  . Cholecalciferol 25 MCG (1000 UT) tablet Take 1,000 Units by mouth daily.  . cyanocobalamin 1000 MCG tablet Take 1,000 mcg by mouth daily.  . dapagliflozin propanediol (FARXIGA) 5 MG TABS tablet Take 1 tablet (5 mg total) by mouth daily before breakfast.  . doxycycline (VIBRA-TABS) 100 MG tablet Take 1 tablet (100 mg total) by mouth 2 (two) times daily for 5 days.  Marland Kitchen ELDERBERRY PO Take by mouth daily.  . fenofibrate (TRICOR) 145 MG tablet Take 1 tablet (145 mg total) by mouth daily.  Marland Kitchen gabapentin (NEURONTIN) 300 MG capsule Take 1 capsule (300 mg total) by mouth 2 (two) times daily.  Marland Kitchen ibuprofen (ADVIL) 600 MG tablet Take 600 mg by mouth 2 (two) times daily as needed.  . insulin glargine (LANTUS) 100 UNIT/ML injection Inject 20 Units into the skin daily.  . Lancets MISC Frequency:PRN   Dosage:0.0     Instructions:  Note:Dose: 1  . losartan (COZAAR) 25 MG tablet Take 1 tablet (25 mg total) by mouth daily.  . metFORMIN (GLUCOPHAGE) 500 MG tablet Take 1 tablet (500 mg total) by mouth 2 (two) times daily with a meal.  . mirtazapine (REMERON) 30 MG tablet Take 30 mg by mouth.  Marland Kitchen omeprazole (PRILOSEC) 40 MG capsule Take 1 capsule (40 mg total) by mouth daily.  Marland Kitchen oxyCODONE (OXY IR/ROXICODONE) 5 MG immediate release tablet Take 5 mg by mouth 2 (two) times daily as needed.  . Teriparatide, Recombinant, 620 MCG/2.48ML SOPN Inject into the skin at bedtime.  Marland Kitchen tiZANidine (ZANAFLEX) 4 MG tablet Take 4 mg by mouth at bedtime.  Marland Kitchen venlafaxine XR (EFFEXOR-XR) 150 MG 24 hr capsule Take  150 mg by mouth daily with breakfast.  . vitamin C (ASCORBIC ACID) 500 MG tablet Take 500 mg by mouth daily.  Marland Kitchen zinc gluconate 50 MG tablet Take 50 mg by mouth daily.    Allergies: Sitagliptin, Atorvastatin, Bupropion, Nitrofurantoin macrocrystal, Other, Pravastatin, Ciprofloxacin, Exenatide, Ezetimibe, Fluconazole, Morphine, Penicillins, Prednisone, Simvastatin, Sulfa antibiotics, and Trazodone  Social History   Tobacco Use  . Smoking status: Current Every Day Smoker    Types: E-cigarettes  . Smokeless tobacco: Never Used  . Tobacco comment: Smoked cigarettes for 18 years (2 PPD)  Vaping Use  . Vaping Use: Every day  Substance Use Topics  . Alcohol use: Never  . Drug use: Never    Family History  Problem Relation Age of Onset  . Heart attack Mother 48  . Emphysema Father   . Heart attack Father 39  . Heart disease Father   . Cancer Sister     Review of Systems: A 12-system review of systems was performed and was negative except as noted in the HPI.  --------------------------------------------------------------------------------------------------  Physical Exam:  BP 140/82 (BP Location: Left Arm, Patient Position: Sitting, Cuff Size: Large)   Pulse (!) 54   Ht 5\' 4"  (1.626 m)   Wt 180 lb (81.6 kg)   SpO2 96%   BMI 30.90 kg/m   General: NAD. HEENT: No conjunctival pallor or scleral icterus. Facemask in place. Neck: Supple without lymphadenopathy, thyromegaly, JVD, or HJR. No carotid bruit. Lungs: Normal work of breathing. Clear to auscultation bilaterally without wheezes or crackles. Heart: Regular rate and rhythm without murmurs, rubs, or gallops. Non-displaced PMI. Abd: Bowel sounds present. Soft, NT/ND without hepatosplenomegaly Ext: No lower extremity edema. Radial, PT, and DP pulses are 2+ bilaterally Skin: Warm and dry without rash. Neuro: CNIII-XII intact. Strength and fine-touch sensation intact in upper and lower extremities bilaterally. Psych: Normal mood  and affect.  EKG: Sinus bradycardia (54 bpm) with LVH, inferior infarct, and poor R wave progression.  No significant change from outside tracing on 08/12/2019.  Lab Results  Component Value Date   WBC 5.9 09/25/2020   HGB 13.6 09/25/2020   HCT 40.4 09/25/2020   MCV 90 09/25/2020   PLT 195 09/25/2020    Lab Results  Component Value Date   NA 144 09/25/2020   K 4.7 09/25/2020   CL 105 09/25/2020   CO2 22 09/25/2020   BUN 13 09/25/2020   CREATININE 0.60 09/25/2020   GLUCOSE 154 (H) 09/25/2020   ALT 20 09/25/2020    Lab Results  Component Value Date   CHOL 260 (H) 09/25/2020   HDL 43 09/25/2020   LDLCALC 151 (H) 09/25/2020   TRIG 356 (H) 09/25/2020   CHOLHDL 6.0 (H) 09/25/2020    --------------------------------------------------------------------------------------------------  ASSESSMENT AND PLAN: Atypical chest pain: Patient reports long history of intermittent chest pains for which she has undergone catheterization and at least 2 stress tests over the last 15 years (most recently myocardial perfusion stress test in 11/2019).  That study showed no evidence of ischemia or scar.  Her EKG today is notable for LVH with poor R wave progression and inferior Q waves.  However, this is unchanged from her outside tracing in 08/2019.  Her chest pain is not characteristic of angina.  We have agreed to defer additional testing at this time.  I have recommended addition of aspirin 81 mg daily given her multiple cardiac risk factors.  Hyperlipidemia associated with type 2 diabetes mellitus.: Recent lipid panel by her PCP is notable for moderately elevated LDL and triglycerides.  Ms. Ceballos has been intolerant of multiple statins in the past.  She does not have objective evidence of ASCVD but would benefit from improved lipids.  For now, I think it is reasonable to try fenofibrate, though Ms. Bovey is concerned that this could be contributing to her tinnitus.  I have encouraged her to  continue on the medication pending ENT consultation.  Follow-up lipid panel in a month or two is recommended to assess response.  Hypertension: Blood pressure borderline elevated today.  Sodium restriction encouraged.  No medication changes at this time.  Follow-up: Return to clinic in 3 months.  Nelva Bush, MD 10/26/2020 12:17 PM

## 2020-10-27 ENCOUNTER — Other Ambulatory Visit: Payer: Self-pay | Admitting: Otolaryngology

## 2020-10-27 ENCOUNTER — Encounter: Payer: Self-pay | Admitting: Internal Medicine

## 2020-10-27 DIAGNOSIS — R0789 Other chest pain: Secondary | ICD-10-CM | POA: Insufficient documentation

## 2020-10-27 DIAGNOSIS — H9041 Sensorineural hearing loss, unilateral, right ear, with unrestricted hearing on the contralateral side: Secondary | ICD-10-CM

## 2020-10-27 DIAGNOSIS — E1169 Type 2 diabetes mellitus with other specified complication: Secondary | ICD-10-CM | POA: Insufficient documentation

## 2020-10-31 NOTE — Telephone Encounter (Signed)
Pt is calling to check on the request for glucose kit. Please advise CB- [phone]

## 2020-10-31 NOTE — Telephone Encounter (Signed)
Order faxed.

## 2020-11-01 ENCOUNTER — Ambulatory Visit
Admission: RE | Admit: 2020-11-01 | Discharge: 2020-11-01 | Disposition: A | Discharge status: Home / Self Care | Payer: Medicare Other | Attending: Nurse Practitioner | Admitting: Nurse Practitioner

## 2020-11-01 ENCOUNTER — Ambulatory Visit
Admission: RE | Admit: 2020-11-01 | Discharge: 2020-11-01 | Disposition: A | Discharge status: Home / Self Care | Payer: Medicare Other | Source: Ambulatory Visit | Attending: Nurse Practitioner | Admitting: Nurse Practitioner

## 2020-11-01 ENCOUNTER — Ambulatory Visit (INDEPENDENT_AMBULATORY_CARE_PROVIDER_SITE_OTHER): Payer: Medicare Other | Admitting: Nurse Practitioner

## 2020-11-01 ENCOUNTER — Encounter: Payer: Self-pay | Admitting: Nurse Practitioner

## 2020-11-01 ENCOUNTER — Other Ambulatory Visit: Payer: Self-pay

## 2020-11-01 VITALS — BP 157/94 | HR 69 | Temp 97.7°F | Wt 179.2 lb

## 2020-11-01 DIAGNOSIS — M79642 Pain in left hand: Secondary | ICD-10-CM | POA: Diagnosis present

## 2020-11-01 DIAGNOSIS — R438 Other disturbances of smell and taste: Secondary | ICD-10-CM

## 2020-11-01 DIAGNOSIS — R300 Dysuria: Secondary | ICD-10-CM | POA: Diagnosis not present

## 2020-11-01 DIAGNOSIS — W19XXXA Unspecified fall, initial encounter: Secondary | ICD-10-CM | POA: Diagnosis not present

## 2020-11-01 DIAGNOSIS — M19042 Primary osteoarthritis, left hand: Secondary | ICD-10-CM | POA: Diagnosis not present

## 2020-11-01 LAB — MICROSCOPIC EXAMINATION: RBC, Urine: NONE SEEN /hpf (ref 0–2)

## 2020-11-01 LAB — URINALYSIS, ROUTINE W REFLEX MICROSCOPIC
Bilirubin, UA: NEGATIVE
Ketones, UA: NEGATIVE
Nitrite, UA: NEGATIVE
Protein,UA: NEGATIVE
RBC, UA: NEGATIVE
Specific Gravity, UA: 1.02 (ref 1.005–1.030)
Urobilinogen, Ur: 0.2 mg/dL (ref 0.2–1.0)
pH, UA: 5 (ref 5.0–7.5)

## 2020-11-01 IMAGING — DX DG HAND COMPLETE 3+V*L*
3 series · 3 of 3 positions shown · non-contrast
Comparison: None.

CLINICAL DATA: Fell 5 days ago, pain at base of thumb

EXAM:
LEFT HAND - COMPLETE 3+ VIEW

[hand ap]
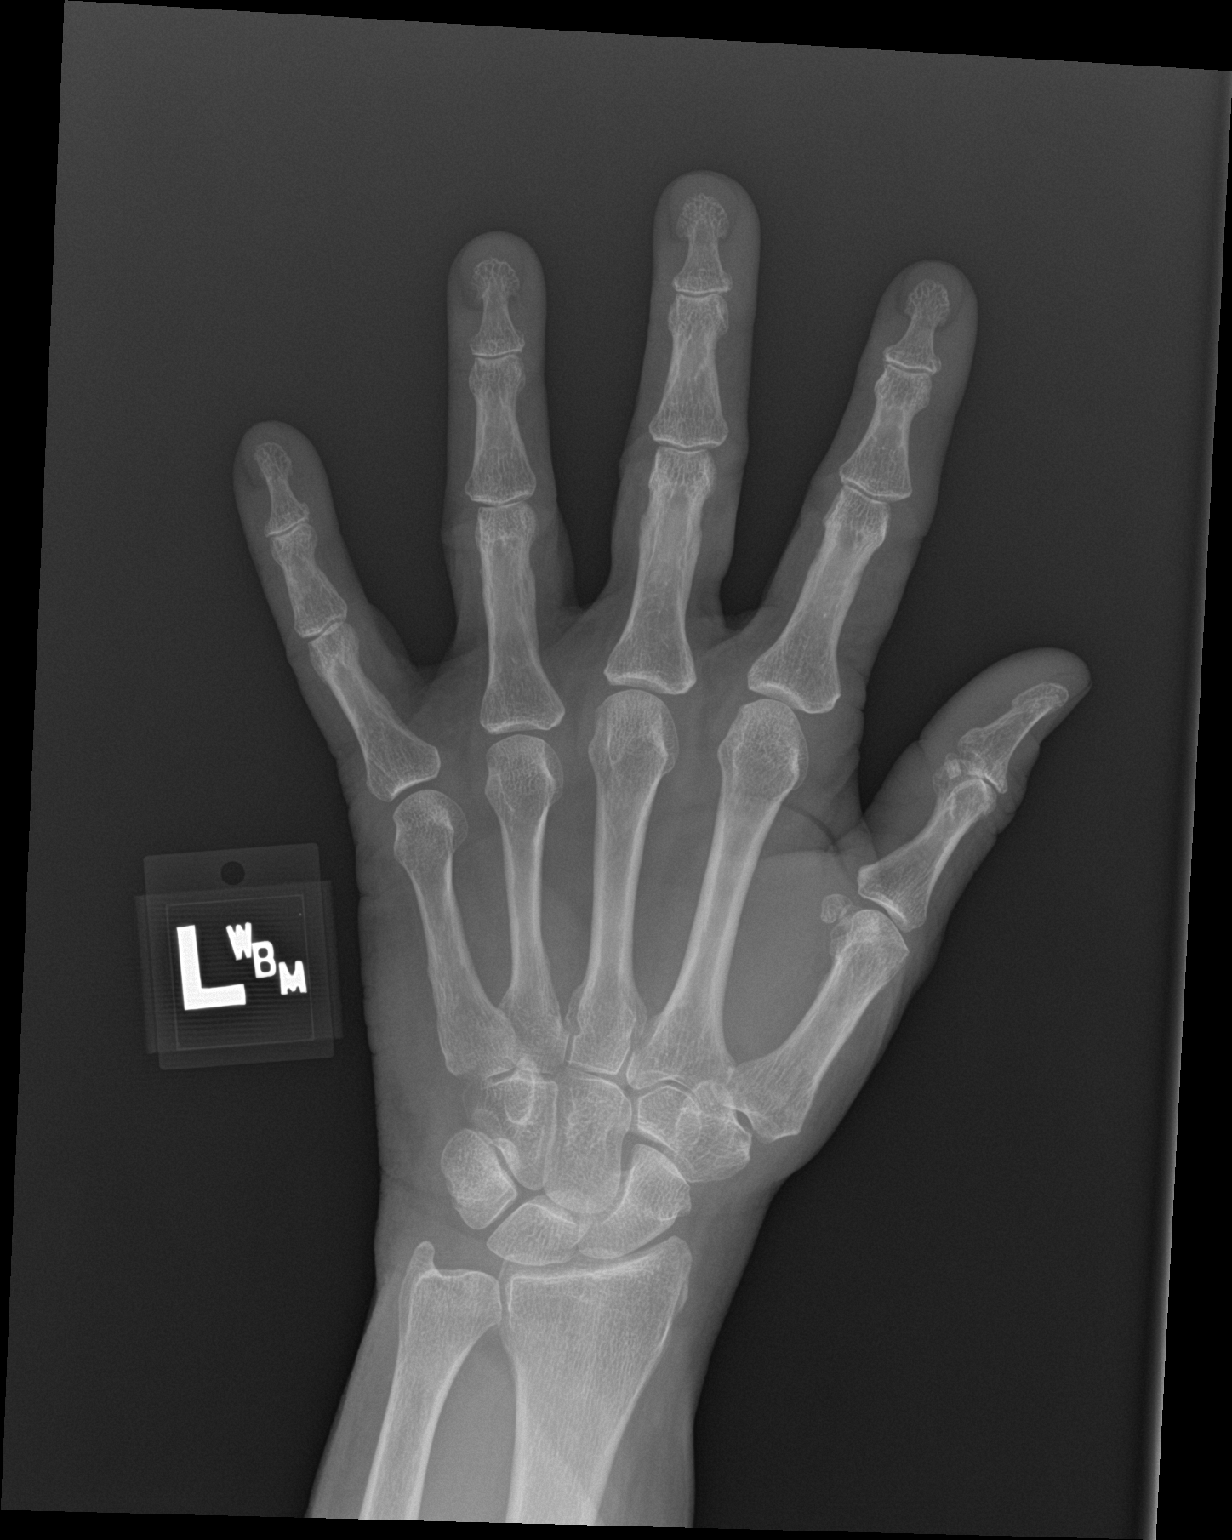

[hand obl]
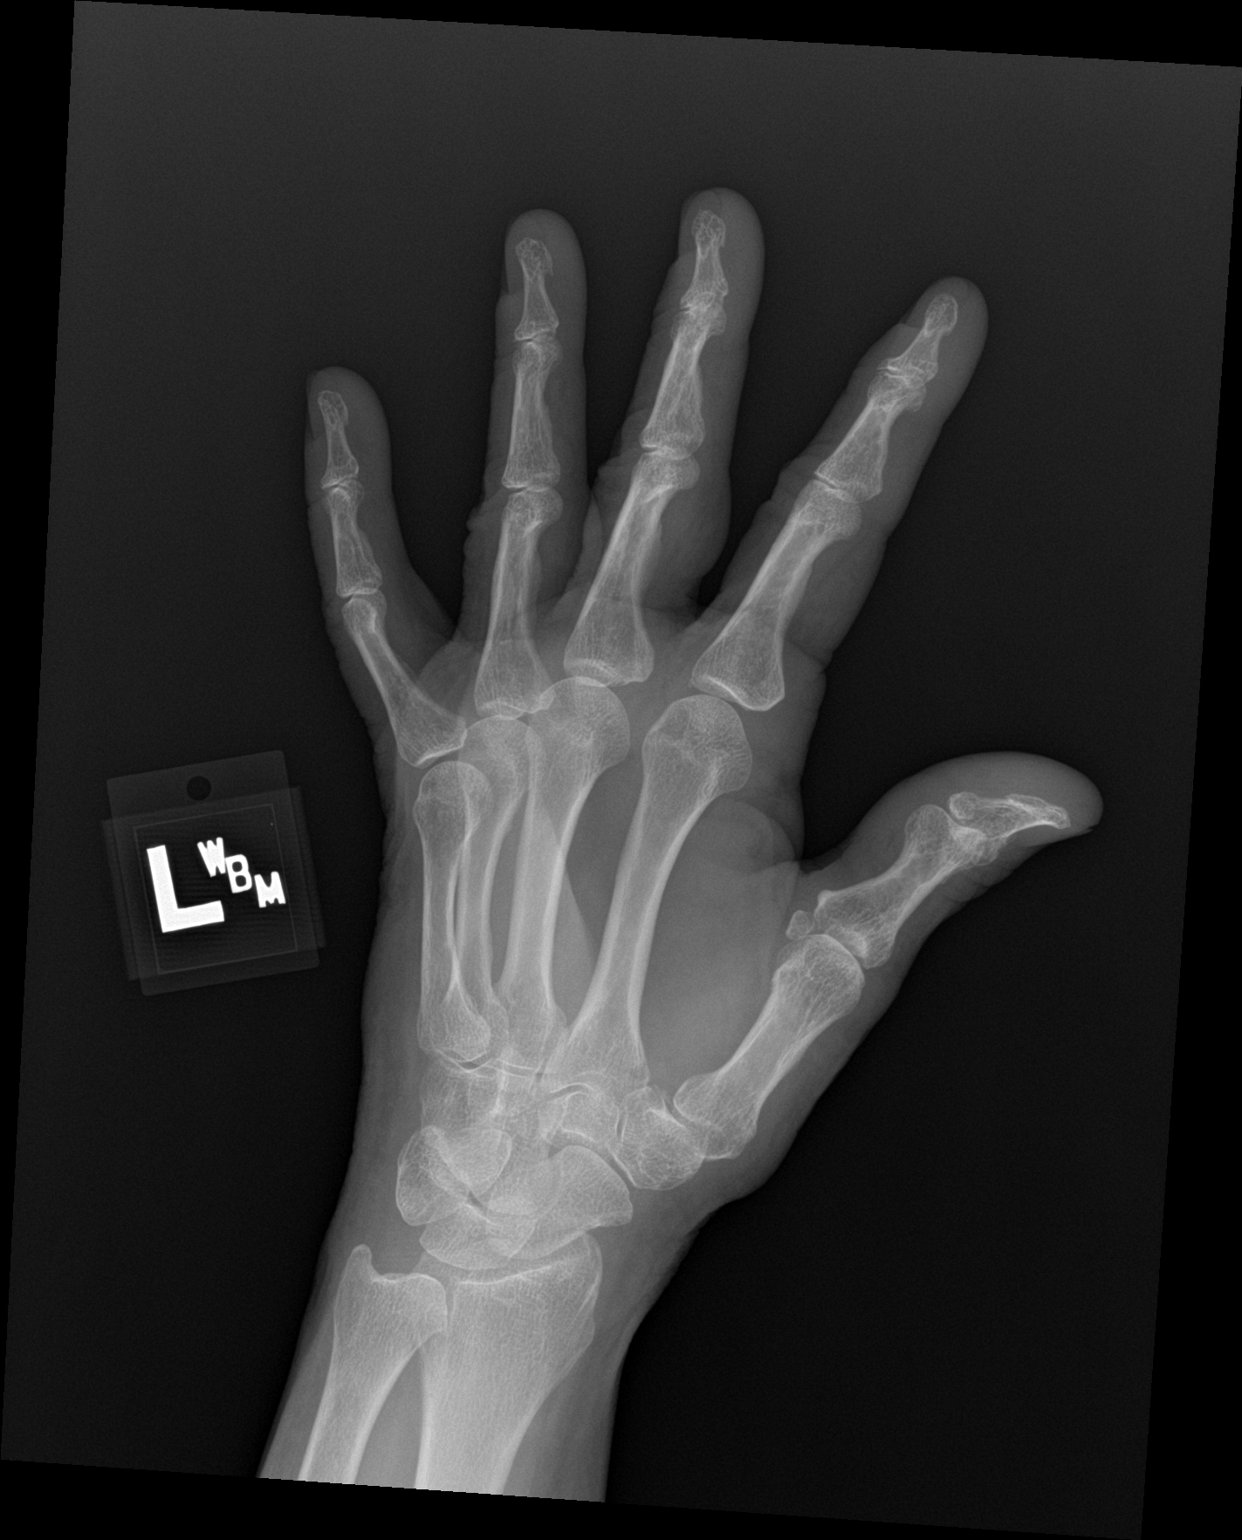

[hand lat]
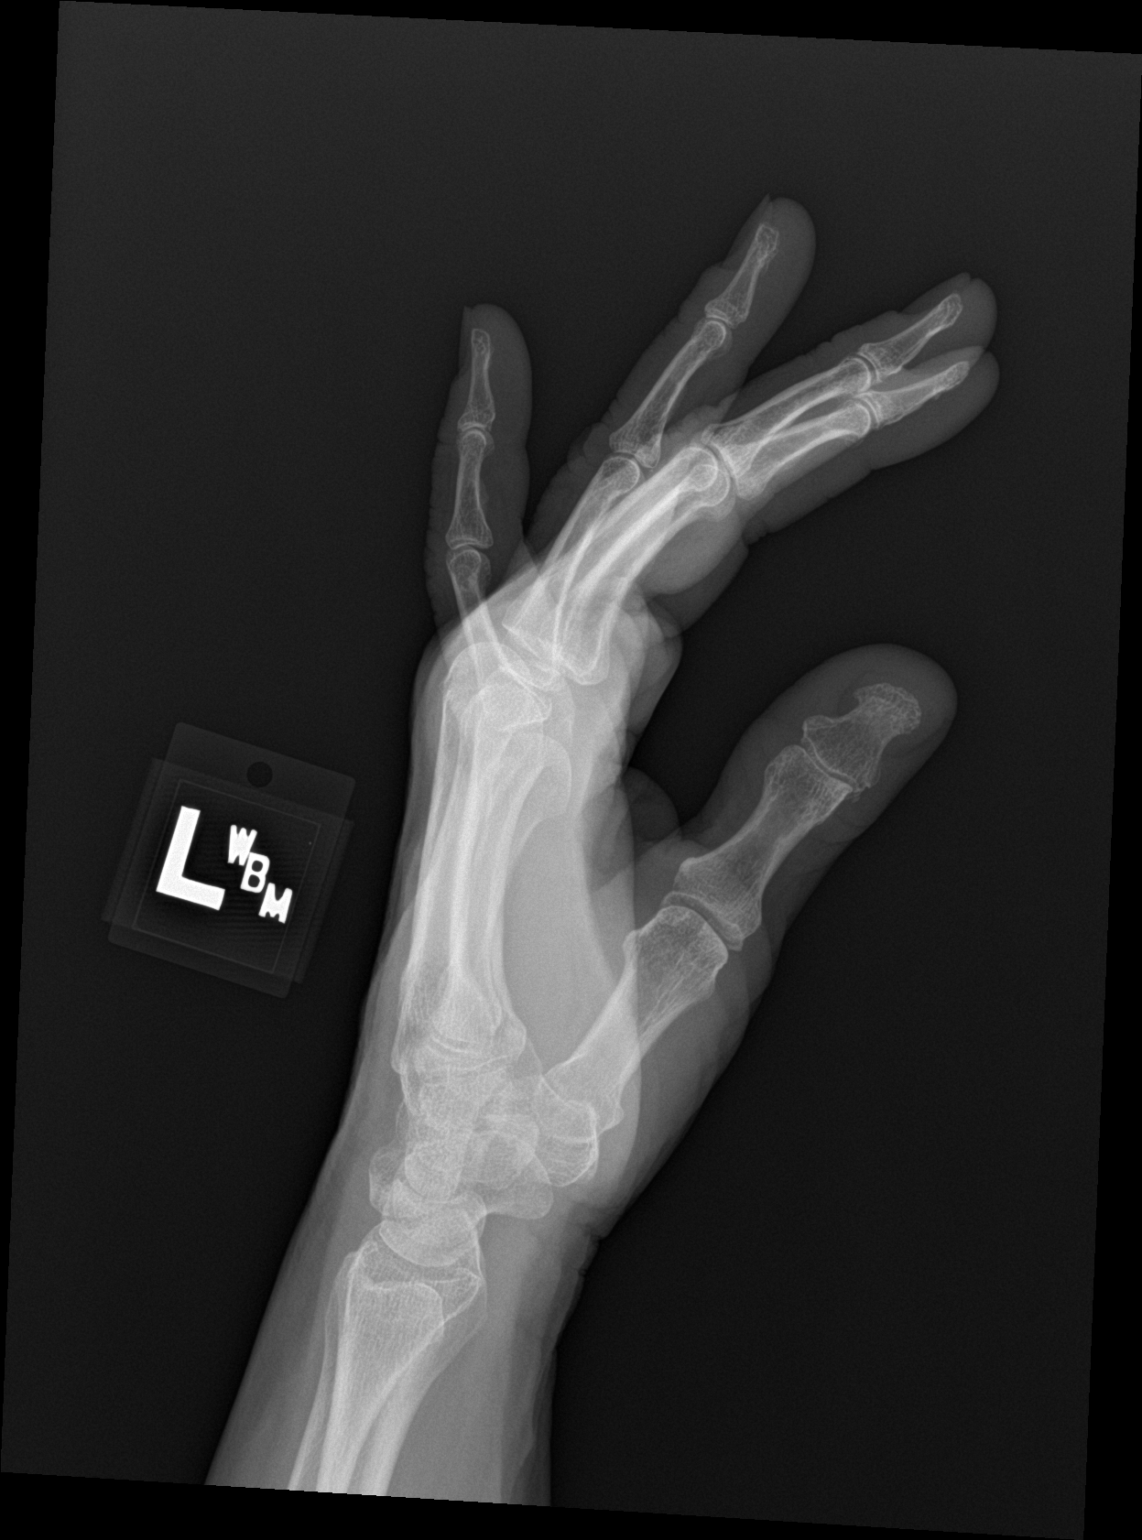

[3 of 3 positions shown; findings below may reference images not displayed]

FINDINGS: Frontal, oblique, and lateral views of the left hand are obtained.
No fracture, subluxation, or dislocation. Mild diffuse joint space
narrowing involving the first metacarpophalangeal joint and
diffusely throughout the distal interphalangeal joints, consistent
with osteoarthritis. Soft tissues are unremarkable.
IMPRESSION: 1. Mild multifocal osteoarthritis.  No acute bony abnormality.

## 2020-11-01 MED ORDER — PROMETHAZINE HCL 12.5 MG PO TABS: 12.5000 mg | ORAL_TABLET | Freq: Three times a day (TID) | ORAL | 0 refills | Status: DC | PRN

## 2020-11-01 NOTE — Patient Instructions (Signed)
[facility] Address: [address] [place], [place] [address] Phone: [phone]

## 2020-11-01 NOTE — Progress Notes (Signed)
Acute Office Visit  Subjective:    Patient ID: Lisa Matthews, female    DOB: 11/01/1955, 65 y.o.   MRN: [id number]  Chief Complaint  Patient presents with  . Hand Pain    Pt states he had a fall Saturday, 10/28/20. States since then, she has had left hand .  Marland Kitchen Nausea    Pt states she has been feeling nauseous for the past 12 days. States it was just happening once daily and now it is all the time. States she has had a metal taste in her mouth as well.     HPI Patient is in today for fall with left hand pain and nausea.   FALL  On Saturday, she was trying to cook, was in a rush and she fell. Denies hitting her head. Endorses that she was dizzy before the fall but denies loss of consciousness. Hit her hand and has been having pain in left hand since. Pain is aching, 8/10 pain. She has tried ice, and tylenol with minimal relief. The pain does not radiate, and is located close to her thumb. Endorses mild swelling and bruising. Denies chest pain, shortness of breath.   NAUSEA  For the last 12 days, she has noticed her mouth tasting metallic like which is making her nauseous. She has not changed her diet recently. She has a history of acid reflux and is currently taking omeprazole daily. She denies sore throat, pain in her mouth, nasal congestion, and post nasal drip. She has not been to a dentist recently, however she wears upper and lower dentures. She denies vomiting and fevers. She was unsure what to try to help her nausea.   URINARY FREQUENCY  She was diagnosed with a urinary tract infection and has finished taking her course of antibiotics. She was initially started on keflex, however it was resistant to this and her antibiotic was changed to doxycyline. She is still having urinary symptoms of frequency and dysuria. Endorses nausea, however could be related to metallic taste in mouth. Denies back pain, fevers.   Past Medical History:  Diagnosis Date  . Allergy   . Anxiety   .  Arthritis   . COPD (chronic obstructive pulmonary disease) ([place])   . Depression   . Diabetes mellitus without complication ([place])   . Heart murmur   . Hypertension   . Kidney stones   . Osteoporosis     Past Surgical History:  Procedure Laterality Date  . CESAREAN SECTION    . CYSTOSCOPY/URETEROSCOPY/HOLMIUM LASER/STENT PLACEMENT    . FRACTURE SURGERY    . JOINT REPLACEMENT Right    knee  . TUBAL LIGATION      Family History  Problem Relation Age of Onset  . Heart attack Mother 26  . Emphysema Father   . Heart attack Father 77  . Heart disease Father   . Cancer Sister     Social History   Socioeconomic History  . Marital status: Widowed    Spouse name: Not on file  . Number of children: Not on file  . Years of education: Not on file  . Highest education level: Not on file  Occupational History  . Not on file  Tobacco Use  . Smoking status: Current Every Day Smoker    Types: E-cigarettes  . Smokeless tobacco: Never Used  . Tobacco comment: Smoked cigarettes for 18 years (2 PPD)  Vaping Use  . Vaping Use: Every day  Substance and Sexual Activity  . Alcohol use:  Never  . Drug use: Never  . Sexual activity: Yes    Birth control/protection: Surgical  Other Topics Concern  . Not on file  Social History Narrative  . Not on file   Social Determinants of Health   Financial Resource Strain: Not on file  Food Insecurity: Not on file  Transportation Needs: Not on file  Physical Activity: Not on file  Stress: Not on file  Social Connections: Not on file  Intimate Partner Violence: Not on file    Outpatient Medications Prior to Visit  Medication Sig Dispense Refill  . acetaminophen (TYLENOL) 650 MG CR tablet Take by mouth as needed.    Marland Kitchen amLODipine (NORVASC) 2.5 MG tablet Take 1 tablet (2.5 mg total) by mouth daily. 30 tablet 3  . aspirin EC 81 MG tablet Take 1 tablet (81 mg total) by mouth daily. Swallow whole. 90 tablet 3  . atenolol (TENORMIN) 50 MG tablet  Take 50 mg by mouth daily.    . Biotin w/ Vitamins C & E (HAIR/SKIN/NAILS PO) Take by mouth daily.    . Calcium Carbonate-Vitamin D 600-400 MG-UNIT tablet Take 1 tablet by mouth daily.    . Cholecalciferol 25 MCG (1000 UT) tablet Take 1,000 Units by mouth daily.    . cyanocobalamin 1000 MCG tablet Take 1,000 mcg by mouth daily.    . dapagliflozin propanediol (FARXIGA) 5 MG TABS tablet Take 1 tablet (5 mg total) by mouth daily before breakfast. 30 tablet 6  . ELDERBERRY PO Take by mouth daily.    . fenofibrate (TRICOR) 145 MG tablet Take 1 tablet (145 mg total) by mouth daily. 30 tablet 6  . gabapentin (NEURONTIN) 300 MG capsule Take 1 capsule (300 mg total) by mouth 2 (two) times daily. 60 capsule 3  . ibuprofen (ADVIL) 600 MG tablet Take 600 mg by mouth 2 (two) times daily as needed.    . insulin glargine (LANTUS) 100 UNIT/ML injection Inject 20 Units into the skin daily.    . Lancets MISC Frequency:PRN   Dosage:0.0     Instructions:  Note:Dose: 1    . losartan (COZAAR) 25 MG tablet Take 1 tablet (25 mg total) by mouth daily. 30 tablet 3  . metFORMIN (GLUCOPHAGE) 500 MG tablet Take 1 tablet (500 mg total) by mouth 2 (two) times daily with a meal. 60 tablet 3  . mirtazapine (REMERON) 30 MG tablet Take 30 mg by mouth.    Marland Kitchen omeprazole (PRILOSEC) 40 MG capsule Take 1 capsule (40 mg total) by mouth daily. 30 capsule 3  . oxyCODONE (OXY IR/ROXICODONE) 5 MG immediate release tablet Take 5 mg by mouth 2 (two) times daily as needed.    . Teriparatide, Recombinant, 620 MCG/2.48ML SOPN Inject into the skin at bedtime.    Marland Kitchen tiZANidine (ZANAFLEX) 4 MG tablet Take 4 mg by mouth at bedtime.    Marland Kitchen venlafaxine XR (EFFEXOR-XR) 150 MG 24 hr capsule Take 150 mg by mouth daily with breakfast.    . vitamin C (ASCORBIC ACID) 500 MG tablet Take 500 mg by mouth daily.    Marland Kitchen zinc gluconate 50 MG tablet Take 50 mg by mouth daily.     No facility-administered medications prior to visit.    Allergies  Allergen  Reactions  . Sitagliptin Anaphylaxis  . Atorvastatin Other (See Comments) and Rash    Pain Pain Pain Pain   . Bupropion Other (See Comments)    "Make me delirious" "Make me delirious" "Make me delirious" "Make me delirious"   .  Nitrofurantoin Macrocrystal Rash  . Other Hives  . Pravastatin Other (See Comments)  . Ciprofloxacin Other (See Comments)    Patient does not know Patient does not know   . Exenatide Rash  . Ezetimibe Rash  . Fluconazole Rash  . Morphine Rash  . Penicillins Rash  . Prednisone Rash  . Simvastatin Rash  . Sulfa Antibiotics Rash  . Trazodone Rash    Review of Systems  Constitutional: Positive for fatigue. Negative for fever.  HENT: Negative for congestion, ear pain, mouth sores, postnasal drip, rhinorrhea and sore throat.        Metallic taste in mouth  Eyes: Negative.   Respiratory: Negative.   Cardiovascular: Negative.   Gastrointestinal: Positive for nausea. Negative for constipation and diarrhea.  Endocrine: Negative.   Genitourinary: Positive for dysuria and frequency. Negative for hematuria.  Musculoskeletal: Negative.   Skin: Negative.   Neurological: Positive for dizziness.       Objective:    Physical Exam Vitals and nursing note reviewed.  Constitutional:      General: She is not in acute distress.    Appearance: Normal appearance.  HENT:     Head: Normocephalic.     Mouth/Throat:     Mouth: Mucous membranes are moist.     Pharynx: Oropharynx is clear. No oropharyngeal exudate or posterior oropharyngeal erythema.  Eyes:     Conjunctiva/sclera: Conjunctivae normal.  Cardiovascular:     Rate and Rhythm: Normal rate and regular rhythm.     Pulses: Normal pulses.     Heart sounds: Normal heart sounds.  Pulmonary:     Effort: Pulmonary effort is normal.     Breath sounds: Normal breath sounds.  Abdominal:     Palpations: Abdomen is soft.     Tenderness: There is no abdominal tenderness. There is no right CVA tenderness  or left CVA tenderness.  Musculoskeletal:        General: Swelling and tenderness present.     Cervical back: Normal range of motion.     Comments: Swelling, ecchymosis, and tenderness to base of left thumb  Skin:    General: Skin is warm.  Neurological:     General: No focal deficit present.     Mental Status: She is alert and oriented to person, place, and time.  Psychiatric:        Mood and Affect: Mood normal.        Behavior: Behavior normal.        Thought Content: Thought content normal.        Judgment: Judgment normal.     BP (!) 157/94 (BP Location: Left Arm, Cuff Size: Normal)   Pulse 69   Temp 97.7 F (36.5 C) (Oral)   Wt 179 lb 3.2 oz (81.3 kg)   SpO2 97%   BMI 30.76 kg/m  Wt Readings from Last 3 Encounters:  11/01/20 179 lb 3.2 oz (81.3 kg)  10/26/20 180 lb (81.6 kg)  10/18/20 180 lb 3.2 oz (81.7 kg)    Health Maintenance Due  Topic Date Due  . OPHTHALMOLOGY EXAM  Never done  . PAP SMEAR-Modifier  Never done  . Zoster Vaccines- Shingrix (1 of 2) Never done    There are no preventive care reminders to display for this patient.   Lab Results  Component Value Date   TSH 2.250 09/25/2020   Lab Results  Component Value Date   WBC 5.9 09/25/2020   HGB 13.6 09/25/2020   HCT 40.4 09/25/2020  MCV 90 09/25/2020   PLT 195 09/25/2020   Lab Results  Component Value Date   NA 144 09/25/2020   K 4.7 09/25/2020   CO2 22 09/25/2020   GLUCOSE 154 (H) 09/25/2020   BUN 13 09/25/2020   CREATININE 0.60 09/25/2020   BILITOT 0.2 09/25/2020   ALKPHOS 151 (H) 09/25/2020   AST 16 09/25/2020   ALT 20 09/25/2020   PROT 6.7 09/25/2020   ALBUMIN 4.1 09/25/2020   CALCIUM 10.5 (H) 09/25/2020   EGFR 100 09/25/2020   Lab Results  Component Value Date   CHOL 260 (H) 09/25/2020   Lab Results  Component Value Date   HDL 43 09/25/2020   Lab Results  Component Value Date   LDLCALC 151 (H) 09/25/2020   Lab Results  Component Value Date   TRIG 356 (H)  09/25/2020   Lab Results  Component Value Date   CHOLHDL 6.0 (H) 09/25/2020   Lab Results  Component Value Date   HGBA1C 8.3 (H) 09/25/2020       Assessment & Plan:   Problem List Items Addressed This Visit   None   Visit Diagnoses    Dysuria    -  Primary   Will recheck U/A and urine culture and treat based on urine culture results.    Relevant Orders   Urinalysis, Routine w reflex microscopic (Completed)   Urine Culture   Fall, initial encounter       Denies LOC, endorsed dizziness. Discussed avoiding fast changes with head and in positions. Will check x-ray of left hand   Relevant Orders   DG Hand Complete Left   Pain of left hand       S/P fall. Check x-ray of left hand. Can continue ice, ibuprofen and tylenol as needed for pain. Await result of x-rays   Relevant Orders   DG Hand Complete Left   Metallic taste       Could be r/t recent antibiotic use. Recent labwork reviewed. Will send phenergan for nausea. F/U if symptoms worsen or don't improve       Meds ordered this encounter  Medications  . promethazine (PHENERGAN) 12.5 MG tablet    Sig: Take 1 tablet (12.5 mg total) by mouth every 8 (eight) hours as needed for nausea or vomiting.    Dispense:  20 tablet    Refill:  0     Charyl Dancer, NP

## 2020-11-03 LAB — URINE CULTURE

## 2020-11-06 ENCOUNTER — Telehealth: Payer: Self-pay | Admitting: Internal Medicine

## 2020-11-06 MED ORDER — VENLAFAXINE HCL ER 150 MG PO CP24: 150.0000 mg | ORAL_CAPSULE | Freq: Every day | ORAL | 4 refills | Status: DC

## 2020-11-06 NOTE — Telephone Encounter (Signed)
Medication: venlafaxine XR (EFFEXOR-XR) 150 MG 24 hr capsule [[id number]]   Has the patient contacted their pharmacy? YES  (Agent: If no, request that the patient contact the pharmacy for the refill.) (Agent: If yes, when and what did the pharmacy   Preferred Pharmacy (with phone number or street name): [facility] ([address]), [place] - [address] [place] [address] Phone: [phone] Fax: [phone] Hours: Not open 24 hours  Pt is a callback when the medication has been sent.   Agent: Please be advised that RX refills may take up to 3 business days. We ask that you follow-up with your pharmacy.

## 2020-11-06 NOTE — Telephone Encounter (Signed)
Routing to provider  

## 2020-11-06 NOTE — Telephone Encounter (Signed)
Scheduled 8/9

## 2020-11-06 NOTE — Telephone Encounter (Signed)
Pt called saying she has been taking this medication twice a day for years and the instructions say one a day with breakfast.  CB#  [phone]

## 2020-11-06 NOTE — Telephone Encounter (Signed)
  Medication filled by a historical provider  Review for continued use and refill   Requested Prescriptions  Pending Prescriptions Disp Refills   venlafaxine XR (EFFEXOR-XR) 150 MG 24 hr capsule      Sig: Take 1 capsule (150 mg total) by mouth daily with breakfast.      Psychiatry: Antidepressants - SNRI - desvenlafaxine & venlafaxine Failed - 11/06/2020  9:59 AM      Failed - LDL in normal range and within 360 days    LDL Chol Calc (NIH)  Date Value Ref Range Status  09/25/2020 151 (H) 0 - 99 mg/dL Final          Failed - Total Cholesterol in normal range and within 360 days    Cholesterol, Total  Date Value Ref Range Status  09/25/2020 260 (H) 100 - 199 mg/dL Final          Failed - Triglycerides in normal range and within 360 days    Triglycerides  Date Value Ref Range Status  09/25/2020 356 (H) 0 - 149 mg/dL Final          Failed - Last BP in normal range    BP Readings from Last 1 Encounters:  11/01/20 (!) 157/94          Passed - Completed PHQ-2 or PHQ-9 in the last 360 days      Passed - Valid encounter within last 6 months    Recent Outpatient Visits           5 days ago [place], Lauren A, NP   2 weeks ago Painful urination   [facility] Vigg, Avanti, MD   1 month ago Diabetes mellitus of other type without complication, unspecified whether long term insulin use ([place])   [place], Avanti, MD   1 month ago Other chronic pain   [place] Vigg, Avanti, MD       Future Appointments             In 1 week Hollice Espy, MD [facility]   In 2 months Vigg, Avanti, MD [facility], [place]   In 3 months End, Harrell Gave, MD [facility], [place]

## 2020-11-07 ENCOUNTER — Encounter: Payer: Self-pay | Admitting: *Deleted

## 2020-11-09 MED ORDER — VENLAFAXINE HCL ER 150 MG PO CP24: 150.0000 mg | ORAL_CAPSULE | Freq: Two times a day (BID) | ORAL | 1 refills | Status: DC

## 2020-11-09 NOTE — Addendum Note (Signed)
Addended by: Jon Billings on: 11/09/2020 02:27 PM   Modules accepted: Orders

## 2020-11-09 NOTE — Telephone Encounter (Signed)
Please advise 

## 2020-11-09 NOTE — Telephone Encounter (Signed)
Pt called about message below Lisa Matthews doesn't want to run out of medication and states that the directions/dose is incorrect on her refill/ it should be twice a day/ pt also asked to speak with someone about changing providers to someone else / please advise / pt is asking for a call today

## 2020-11-13 ENCOUNTER — Ambulatory Visit
Admission: RE | Admit: 2020-11-13 | Discharge: 2020-11-13 | Disposition: A | Discharge status: Home / Self Care | Payer: Medicare Other | Source: Ambulatory Visit | Attending: Otolaryngology | Admitting: Otolaryngology

## 2020-11-13 ENCOUNTER — Other Ambulatory Visit: Payer: Self-pay

## 2020-11-13 DIAGNOSIS — H9041 Sensorineural hearing loss, unilateral, right ear, with unrestricted hearing on the contralateral side: Secondary | ICD-10-CM | POA: Diagnosis present

## 2020-11-13 IMAGING — MR MR BRAIN/IAC WO/W CM
10 of 15 series · 27 of 48 positions shown · IV contrast (gadavist)
Comparison: No pertinent prior exams available for comparison.

CLINICAL DATA: Sensorineural hearing loss of right ear with
unrestricted hearing of left ear. Additional history provided:
Patient reports headache with tinnitus in both ears for 3 months.

EXAM:
MRI HEAD WITHOUT AND WITH CONTRAST
TECHNIQUE: Multiplanar, multiecho pulse sequences of the brain and surrounding
structures were obtained without and with intravenous contrast.
CONTRAST:  7.5mL GADAVIST GADOBUTROL 1 MMOL/ML IV SOLN

[Series 5: T1 · sagittal · 5.0mm · 0.62mm/px · 1 of 25 slices shown (1 of 3)]
[im 1/25]
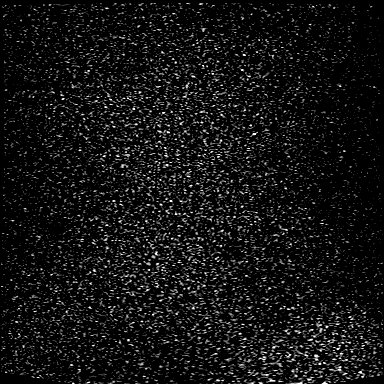

[Series 6: ax dwi_tracew · axial · 3.0mm · 0.60mm/px · z∈[-127,+26]mm · 5 of 96 slices shown]
[im 1/96]
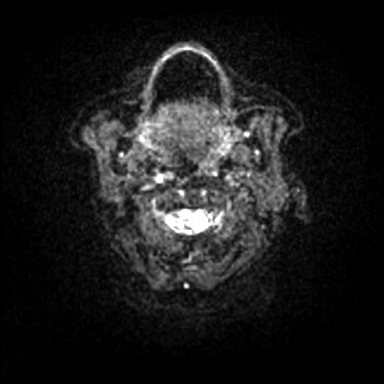
[im 24/96]
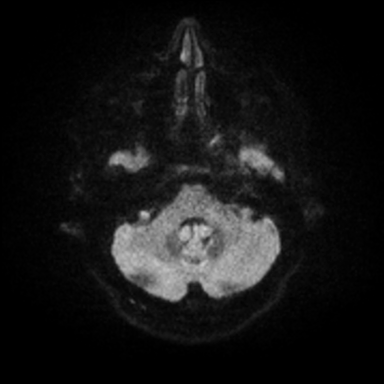
[im 48/96]
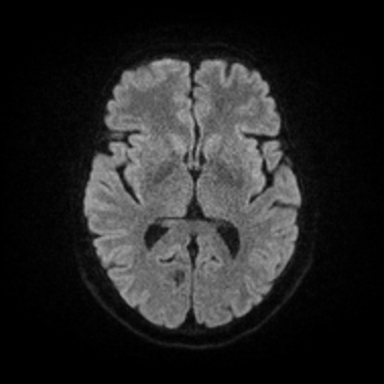
[im 72/96]
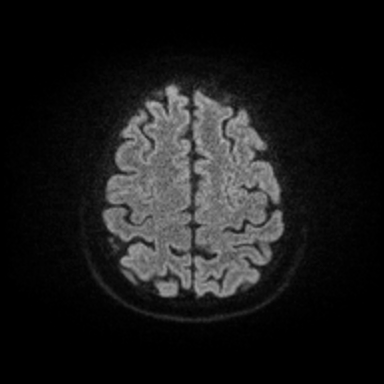
[im 96/96]
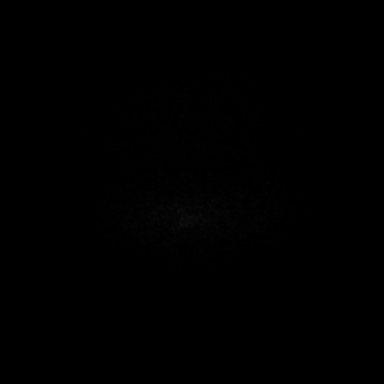

[Series 7: ax dwi_adc · axial · 3.0mm · 0.60mm/px · 1 of 47 slices shown]
[im 1/47]
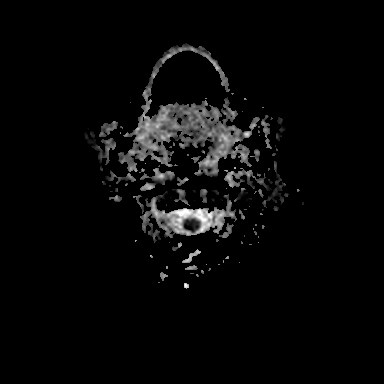

[Series 8: T2 · axial · 5.0mm · 0.53mm/px · z∈[-122,+20]mm · 2 of 25 slices shown]
[im 1/25]
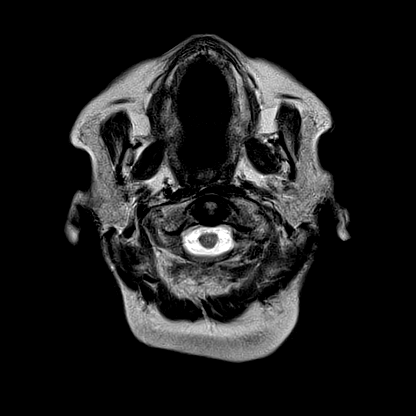
[im 25/25]
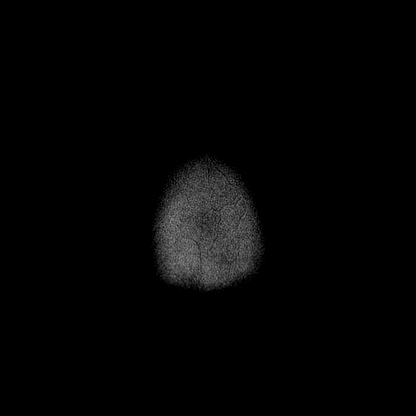

[Series 13: FLAIR · axial · 3.0mm · 0.53mm/px · z∈[-131,+29]mm · 3 of 55 slices shown]
[im 1/55]
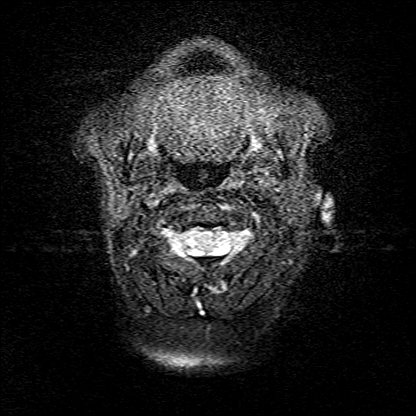
[im 28/55]
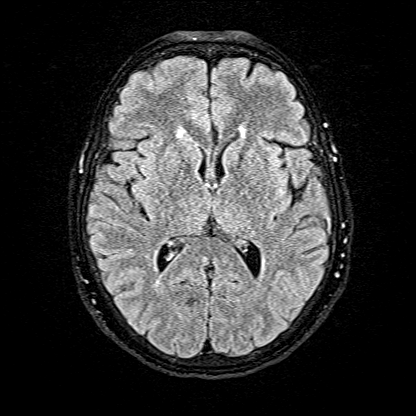
[im 55/55]
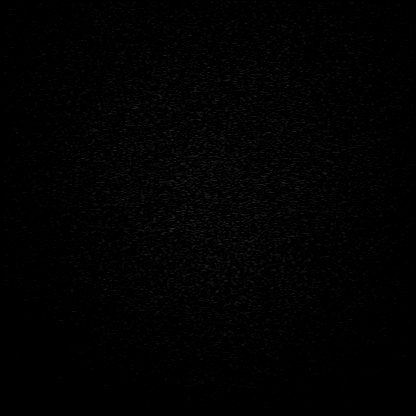

[Series 14: T1 · coronal · non-contrast · 3.0mm · 0.21mm/px · 1 of 13 slices shown (2 of 3)]
[im 1/13]
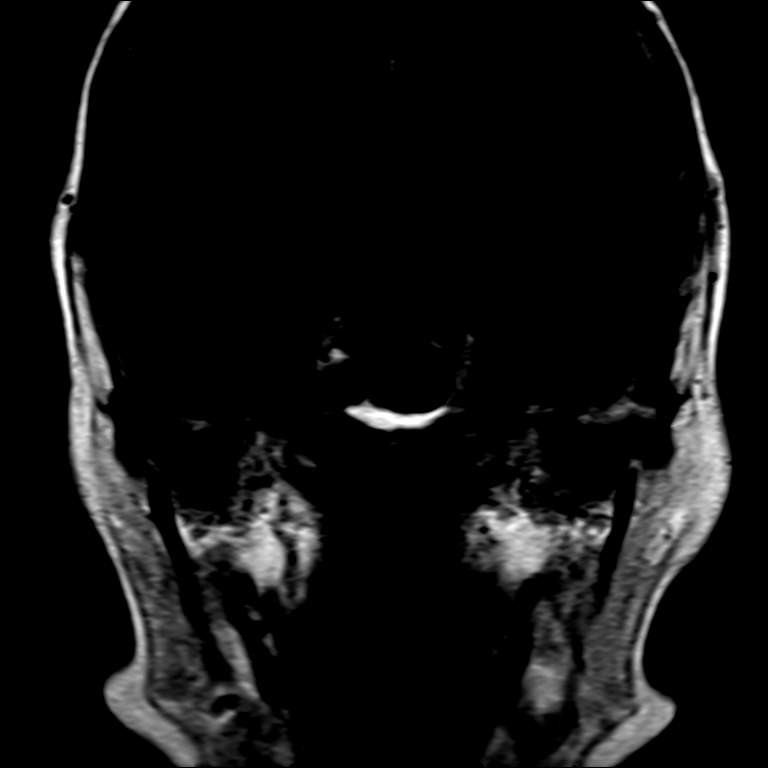

[Series 16: T1 · axial · non-contrast · 3.0mm · 0.21mm/px · 1 of 15 slices shown (3 of 3)]
[im 1/15]
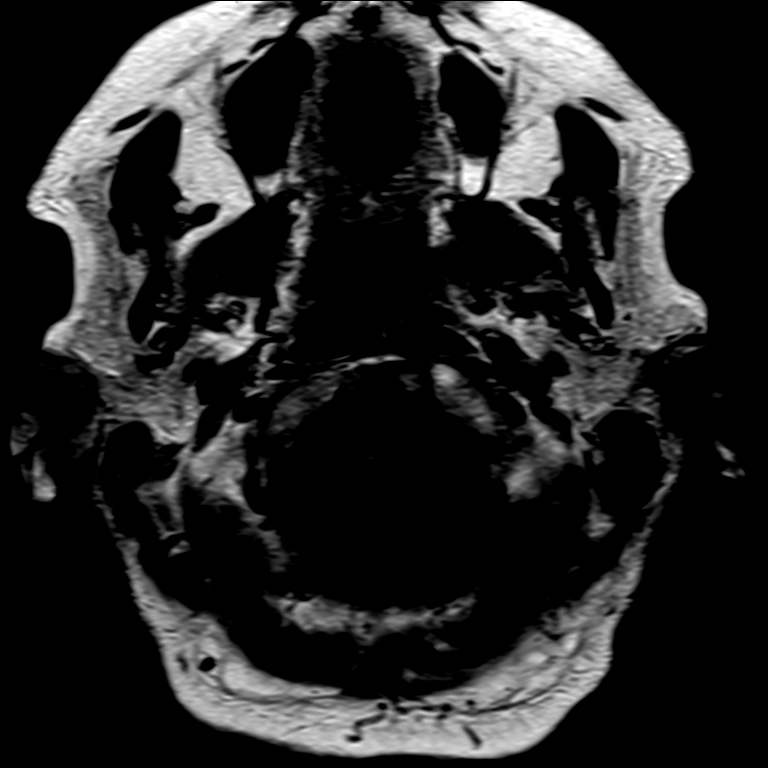

[Series 17: T1 post-contrast · axial · 3.0mm · 0.21mm/px · 1 of 15 slices shown (1 of 3)]
[im 1/15]
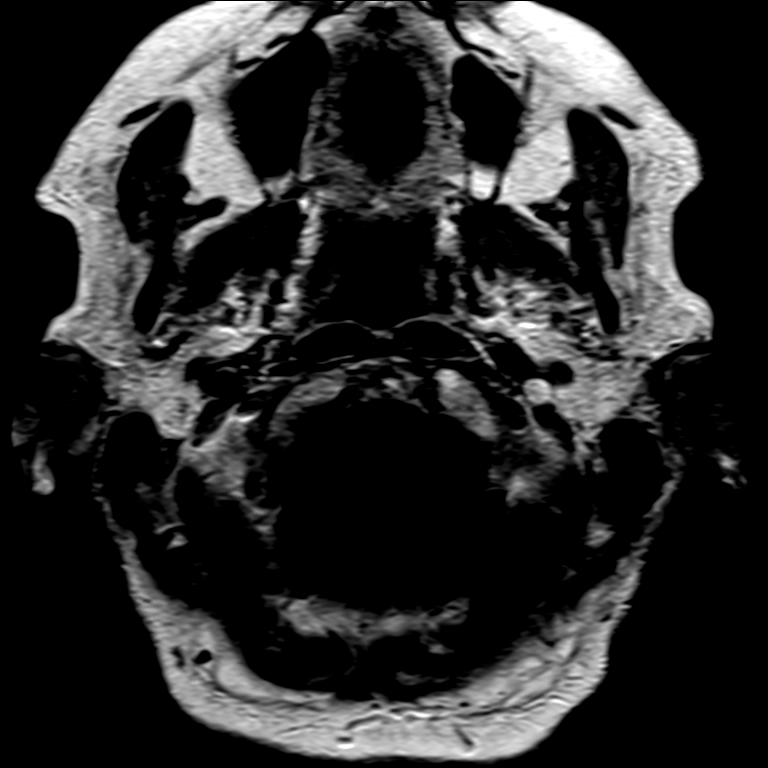

[Series 18: T1 post-contrast · coronal · 3.0mm · 0.21mm/px · 1 of 13 slices shown (2 of 3)]
[im 1/13]
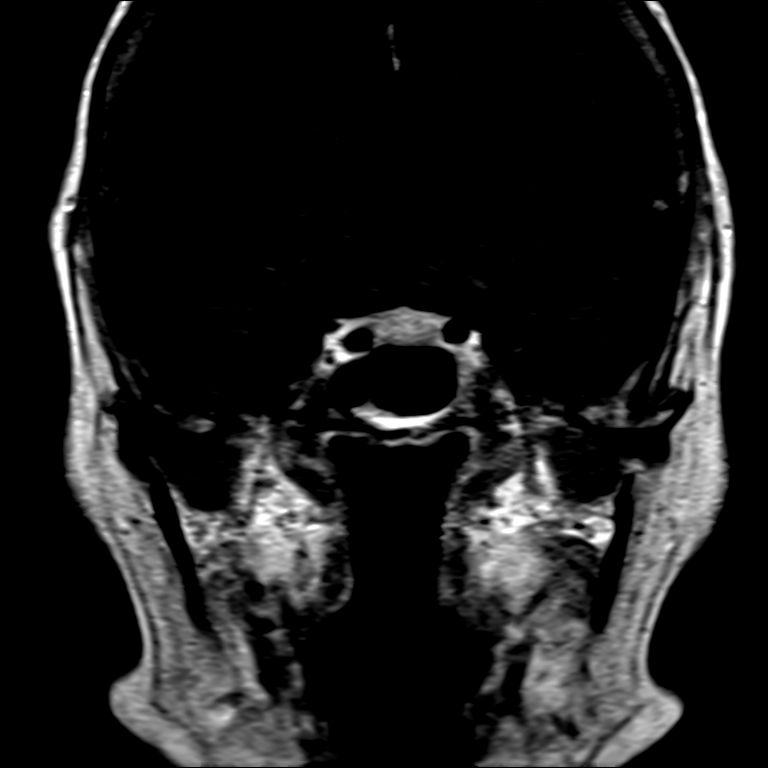

[Series 19: T1 post-contrast · axial · 1.0mm · 0.98mm/px · z∈[-136,+38]mm · 11 of 176 slices shown (3 of 3)]
[im 1/176]
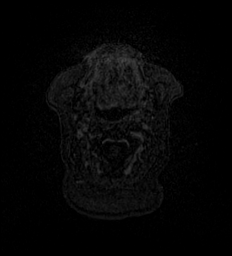
[im 18/176]
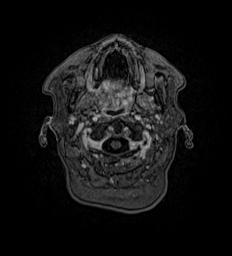
[im 36/176]
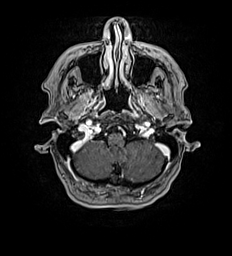
[im 53/176]
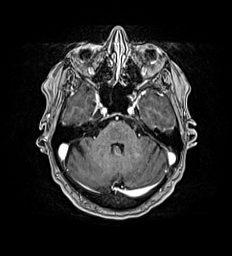
[im 71/176]
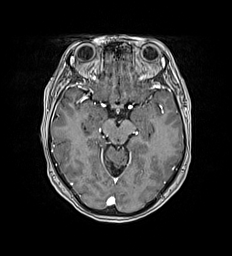
[im 88/176]
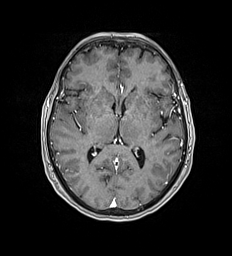
[im 106/176]
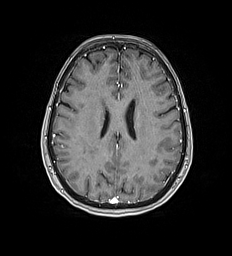
[im 123/176]
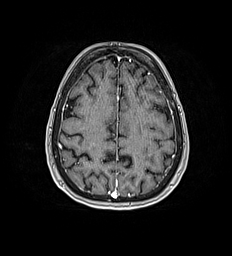
[im 141/176]
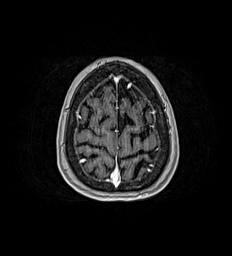
[im 158/176]
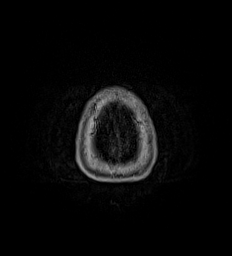
[im 176/176]
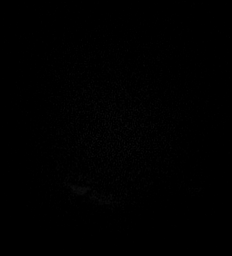

[27 of 48 positions shown; findings below may reference images not displayed]

FINDINGS: Brain:

Mild intermittent motion degradation.

Cerebral volume is normal for age.

Small chronic lacunar infarct within the left lentiform nucleus.

Moderate multifocal T2/FLAIR hyperintensity within the cerebral
white matter and pons, nonspecific but likely reflecting chronic
small vessel ischemic disease given the patient's history of
diabetes and hypertension.

No evidence of an intracranial mass. Specifically, no
cerebellopontine angle or internal auditory canal mass is
demonstrated.

There is no acute infarct.

No chronic intracranial blood products.

No extra-axial fluid collection.

No midline shift.

No abnormal intracranial enhancement.

Vascular: Expected proximal arterial flow voids.

Skull and upper cervical spine: No focal marrow lesion.

Sinuses/Orbits: Visualized orbits show no acute finding. No
significant paranasal sinus disease.
IMPRESSION: Mildly motion degraded exam.

No evidence of acute intracranial abnormality.

No cerebellopontine angle or internal auditory canal mass is
identified.

Chronic left basal ganglia lacunar infarct.

Moderate multifocal T2/FLAIR hyperintensity within the cerebral
white matter and pons, nonspecific but likely reflecting chronic
small vessel ischemic disease given the patient's history of
diabetes and hypertension.

## 2020-11-13 MED ORDER — GADOBUTROL 1 MMOL/ML IV SOLN
8.0000 mL | Freq: Once | INTRAVENOUS | Status: AC | PRN
  Administered 2020-11-13: 7.5 mL via INTRAVENOUS

## 2020-11-14 ENCOUNTER — Ambulatory Visit: Payer: Medicare Other | Admitting: Urology

## 2020-11-14 NOTE — Progress Notes (Deleted)
11/14/2020 10:24 AM   Lisa Matthews September 26, 1955 [id number]  Referring provider: Charlynne Cousins, MD [address] [place],  [place] [address]  No chief complaint on file.   HPI:  Since last visit, we received records from [place] [facility] from [facility].  This indicates that she underwent ureteroscopy in 2018.  She developed recurrent flank pain and ultimately ended up having a CT scan on February 11, 2018 which showed bilateral stones measuring up to 7 mm in the right kidney.  Presumably there is none in the left.  PMH: Past Medical History:  Diagnosis Date   Allergy    Anxiety    Arthritis    COPD (chronic obstructive pulmonary disease) ([place])    Depression    Diabetes mellitus without complication (HCC)    Heart murmur    Hypertension    Kidney stones    Osteoporosis     Surgical History: Past Surgical History:  Procedure Laterality Date   CESAREAN SECTION     CYSTOSCOPY/URETEROSCOPY/HOLMIUM LASER/STENT PLACEMENT     FRACTURE SURGERY     JOINT REPLACEMENT Right    knee   TUBAL LIGATION      Home Medications:  Allergies as of 11/14/2020       Reactions   Sitagliptin Anaphylaxis   Atorvastatin Other (See Comments), Rash   Pain Pain Pain Pain   Bupropion Other (See Comments)   "Make me delirious" "Make me delirious" "Make me delirious" "Make me delirious"   Nitrofurantoin Macrocrystal Rash   Other Hives   Pravastatin Other (See Comments)   Ciprofloxacin Other (See Comments)   Patient does not know Patient does not know   Exenatide Rash   Ezetimibe Rash   Fluconazole Rash   Morphine Rash   Penicillins Rash   Prednisone Rash   Simvastatin Rash   Sulfa Antibiotics Rash   Trazodone Rash        Medication List        Accurate as of November 14, 2020 10:24 AM. If you have any questions, ask your nurse or doctor.          acetaminophen 650 MG CR tablet Commonly known as: TYLENOL Take by mouth as needed.   amLODipine 2.5 MG tablet Commonly  known as: NORVASC Take 1 tablet (2.5 mg total) by mouth daily.   aspirin EC 81 MG tablet Take 1 tablet (81 mg total) by mouth daily. Swallow whole.   atenolol 50 MG tablet Commonly known as: TENORMIN Take 50 mg by mouth daily.   Calcium Carbonate-Vitamin D 600-400 MG-UNIT tablet Take 1 tablet by mouth daily.   Cholecalciferol 25 MCG (1000 UT) tablet Take 1,000 Units by mouth daily.   cyanocobalamin 1000 MCG tablet Take 1,000 mcg by mouth daily.   dapagliflozin propanediol 5 MG Tabs tablet Commonly known as: Farxiga Take 1 tablet (5 mg total) by mouth daily before breakfast.   ELDERBERRY PO Take by mouth daily.   fenofibrate 145 MG tablet Commonly known as: Tricor Take 1 tablet (145 mg total) by mouth daily.   gabapentin 300 MG capsule Commonly known as: NEURONTIN Take 1 capsule (300 mg total) by mouth 2 (two) times daily.   HAIR/SKIN/NAILS PO Take by mouth daily.   ibuprofen 600 MG tablet Commonly known as: ADVIL Take 600 mg by mouth 2 (two) times daily as needed.   insulin glargine 100 UNIT/ML injection Commonly known as: LANTUS Inject 20 Units into the skin daily.   Lancets Misc Frequency:PRN   Dosage:0.0  Instructions:  Note:Dose: 1   losartan 25 MG tablet Commonly known as: COZAAR Take 1 tablet (25 mg total) by mouth daily.   metFORMIN 500 MG tablet Commonly known as: GLUCOPHAGE Take 1 tablet (500 mg total) by mouth 2 (two) times daily with a meal.   mirtazapine 30 MG tablet Commonly known as: REMERON Take 30 mg by mouth.   omeprazole 40 MG capsule Commonly known as: PRILOSEC Take 1 capsule (40 mg total) by mouth daily.   oxyCODONE 5 MG immediate release tablet Commonly known as: Oxy IR/ROXICODONE Take 5 mg by mouth 2 (two) times daily as needed.   promethazine 12.5 MG tablet Commonly known as: PHENERGAN Take 1 tablet (12.5 mg total) by mouth every 8 (eight) hours as needed for nausea or vomiting.   Teriparatide (Recombinant) 620  MCG/2.48ML Sopn Inject into the skin at bedtime.   tiZANidine 4 MG tablet Commonly known as: ZANAFLEX Take 4 mg by mouth at bedtime.   venlafaxine XR 150 MG 24 hr capsule Commonly known as: EFFEXOR-XR Take 1 capsule (150 mg total) by mouth in the morning and at bedtime.   vitamin C 500 MG tablet Commonly known as: ASCORBIC ACID Take 500 mg by mouth daily.   zinc gluconate 50 MG tablet Take 50 mg by mouth daily.        Allergies:  Allergies  Allergen Reactions   Sitagliptin Anaphylaxis   Atorvastatin Other (See Comments) and Rash    Pain Pain Pain Pain    Bupropion Other (See Comments)    "Make me delirious" "Make me delirious" "Make me delirious" "Make me delirious"    Nitrofurantoin Macrocrystal Rash   Other Hives   Pravastatin Other (See Comments)   Ciprofloxacin Other (See Comments)    Patient does not know Patient does not know    Exenatide Rash   Ezetimibe Rash   Fluconazole Rash   Morphine Rash   Penicillins Rash   Prednisone Rash   Simvastatin Rash   Sulfa Antibiotics Rash   Trazodone Rash    Family History: Family History  Problem Relation Age of Onset   Heart attack Mother 24   Emphysema Father    Heart attack Father 6   Heart disease Father    Cancer Sister     Social History:  reports that she has been smoking e-cigarettes. She has never used smokeless tobacco. She reports that she does not drink alcohol and does not use drugs.   Physical Exam: There were no vitals taken for this visit.  Constitutional:  Alert and oriented, No acute distress. HEENT:  AT, moist mucus membranes.  Trachea midline, no masses. Cardiovascular: No clubbing, cyanosis, or edema. Respiratory: Normal respiratory effort, no increased work of breathing. GI: Abdomen is soft, nontender, nondistended, no abdominal masses GU: No CVA tenderness Lymph: No cervical or inguinal lymphadenopathy. Skin: No rashes, bruises or suspicious lesions. Neurologic: Grossly  intact, no focal deficits, moving all 4 extremities. Psychiatric: Normal mood and affect.  Laboratory Data: Lab Results  Component Value Date   WBC 5.9 09/25/2020   HGB 13.6 09/25/2020   HCT 40.4 09/25/2020   MCV 90 09/25/2020   PLT 195 09/25/2020    Lab Results  Component Value Date   CREATININE 0.60 09/25/2020    No results found for: PSA  No results found for: TESTOSTERONE  Lab Results  Component Value Date   HGBA1C 8.3 (H) 09/25/2020    Urinalysis    Component Value Date/Time   APPEARANCEUR Cloudy (A)  11/01/2020 1101   GLUCOSEU 1+ (A) 11/01/2020 1101   BILIRUBINUR Negative 11/01/2020 1101   PROTEINUR Negative 11/01/2020 1101   NITRITE Negative 11/01/2020 1101   LEUKOCYTESUR 1+ (A) 11/01/2020 1101    Lab Results  Component Value Date   LABMICR See below: 11/01/2020   WBCUA 11-30 (A) 11/01/2020   LABEPIT 0-10 11/01/2020   MUCUS Present (A) 11/01/2020   BACTERIA Many (A) 11/01/2020    Pertinent Imaging: No results found for this or any previous visit.  No results found for this or any previous visit.  No results found for this or any previous visit.  No results found for this or any previous visit.  No results found for this or any previous visit.  No results found for this or any previous visit.  No results found for this or any previous visit.  No results found for this or any previous visit.   Assessment & Plan:    There are no diagnoses linked to this encounter.  No follow-ups on file.  Hollice Espy, MD  [facility] [address] [place], [place] [address] [phone]

## 2020-11-16 ENCOUNTER — Other Ambulatory Visit: Payer: Self-pay

## 2020-11-16 ENCOUNTER — Encounter: Payer: Self-pay | Admitting: Gastroenterology

## 2020-11-16 ENCOUNTER — Ambulatory Visit (INDEPENDENT_AMBULATORY_CARE_PROVIDER_SITE_OTHER): Payer: Medicare Other | Admitting: Gastroenterology

## 2020-11-16 ENCOUNTER — Other Ambulatory Visit: Payer: Self-pay | Admitting: Internal Medicine

## 2020-11-16 VITALS — BP 145/83 | HR 64 | Temp 97.5°F | Ht 64.0 in | Wt 178.2 lb

## 2020-11-16 DIAGNOSIS — R748 Abnormal levels of other serum enzymes: Secondary | ICD-10-CM

## 2020-11-16 DIAGNOSIS — K5900 Constipation, unspecified: Secondary | ICD-10-CM | POA: Diagnosis not present

## 2020-11-16 DIAGNOSIS — R112 Nausea with vomiting, unspecified: Secondary | ICD-10-CM

## 2020-11-16 DIAGNOSIS — Z1211 Encounter for screening for malignant neoplasm of colon: Secondary | ICD-10-CM

## 2020-11-16 MED ORDER — NA SULFATE-K SULFATE-MG SULF 17.5-3.13-1.6 GM/177ML PO SOLN: 354.0000 mL | Freq: Once | ORAL | 0 refills | Status: AC

## 2020-11-16 MED ORDER — LINACLOTIDE 145 MCG PO CAPS: 145.0000 ug | ORAL_CAPSULE | Freq: Every day | ORAL | 1 refills | Status: DC

## 2020-11-16 NOTE — Telephone Encounter (Signed)
Copied from [place] [id number]. Topic: Quick Communication - Rx Refill/Question >> Nov 16, 2020  9:57 AM Leward Quan A wrote: Medication: atenolol (TENORMIN) 50 MG tablet   Has the patient contacted their pharmacy? Yes.   (Agent: If no, request that the patient contact the pharmacy for the refill.) (Agent: If yes, when and what did the pharmacy advise?)  Preferred Pharmacy (with phone number or street name): [facility] [place]  Phone:  [phone] Fax:  [phone]     Agent: Please be advised that RX refills may take up to 3 business days. We ask that you follow-up with your pharmacy.

## 2020-11-16 NOTE — Telephone Encounter (Signed)
   Notes to clinic:  medication was filled by a historical provider  Review for continued use and refill    Requested Prescriptions  Pending Prescriptions Disp Refills   atenolol (TENORMIN) 50 MG tablet      Sig: Take 1 tablet (50 mg total) by mouth daily.      Cardiovascular:  Beta Blockers Failed - 11/16/2020 10:02 AM      Failed - Last BP in normal range    BP Readings from Last 1 Encounters:  11/01/20 (!) 157/94          Passed - Last Heart Rate in normal range    Pulse Readings from Last 1 Encounters:  11/01/20 69          Passed - Valid encounter within last 6 months    Recent Outpatient Visits           2 weeks ago [place], Lauren A, NP   4 weeks ago Painful urination   [facility] Vigg, Avanti, MD   1 month ago Diabetes mellitus of other type without complication, unspecified whether long term insulin use ([place])   [facility] Vigg, Avanti, MD   1 month ago Other chronic pain   [place], MD       Future Appointments             In 1 month Vigg, Avanti, MD [facility], [place]   In 3 months End, Harrell Gave, MD [facility], [place]

## 2020-11-16 NOTE — Telephone Encounter (Signed)
Scheduled 8/2

## 2020-11-16 NOTE — Progress Notes (Signed)
Lisa Matthews, [place] [address]  Main: [phone]  Fax: [phone]   Gastroenterology Consultation  Referring Provider:     Charlynne Cousins, MD Primary Care Physician:  Charlynne Cousins, MD Reason for Consultation:     Constipation, nausea vomiting        HPI:    Chief complaint: Nausea and vomiting, constipation  Lisa Matthews is a 65 y.o. y/o female referred for consultation & management  by Dr. Charlynne Cousins, MD. patient reports chronic history of constipation nausea vomiting.  Patient reports taking an enema once or twice a week and does not take any oral meds for constipation.  No blood in stool.  Reports last colonoscopy was 10 years ago.  Procedure report not available.  She reports dull abdominal pain, diffuse, 5/10, nonradiating.  I was able to review a GI note from 2021 at an outside facility, [facility], and it appears that they had recommended colonoscopy at that time, but this procedure was not able to be done due to poor prep even before the procedure.  Patient states she took her prep as instructed at that time as well.  MiraLAX was recommended to her for constipation.  States she has taken MiraLAX but it does not work.  Past Medical History:  Diagnosis Date   Allergy    Anxiety    Arthritis    COPD (chronic obstructive pulmonary disease) ([place])    Depression    Diabetes mellitus without complication (HCC)    Heart murmur    Hypertension    Kidney stones    Osteoporosis     Past Surgical History:  Procedure Laterality Date   CESAREAN SECTION     CYSTOSCOPY/URETEROSCOPY/HOLMIUM LASER/STENT PLACEMENT     FRACTURE SURGERY     JOINT REPLACEMENT Right    knee   TUBAL LIGATION      Prior to Admission medications   Medication Sig Start Date End Date Taking? Authorizing Provider  ACCU-CHEK GUIDE test strip USE TO CHECK BLOOD SUGAR 4 TIMES DAILY 11/01/20  Yes [provider]  acetaminophen (TYLENOL) 650 MG CR  tablet Take by mouth as needed.   Yes [provider]  atenolol (TENORMIN) 50 MG tablet Take 50 mg by mouth daily.   Yes [provider]  Biotin w/ Vitamins C & E (HAIR/SKIN/NAILS PO) Take by mouth daily.   Yes [provider]  Blood Glucose Monitoring Suppl (ACCU-CHEK GUIDE ME) w/Device KIT CHECK BLOOD SUGAR 4 TIMES DAILY 11/01/20  Yes [provider]  Calcium Carbonate-Vitamin D 600-400 MG-UNIT tablet Take 1 tablet by mouth daily.   Yes [provider]  Cholecalciferol 25 MCG (1000 UT) tablet Take 1,000 Units by mouth daily.   Yes [provider]  cyanocobalamin 1000 MCG tablet Take 1,000 mcg by mouth daily.   Yes [provider]  dapagliflozin propanediol (FARXIGA) 5 MG TABS tablet Take 1 tablet (5 mg total) by mouth daily before breakfast. 09/25/20  Yes Vigg, Avanti, MD  ELDERBERRY PO Take by mouth daily.   Yes [provider]  gabapentin (NEURONTIN) 300 MG capsule Take 1 capsule (300 mg total) by mouth 2 (two) times daily. 10/09/20  Yes Vigg, Avanti, MD  ibuprofen (ADVIL) 600 MG tablet Take 600 mg by mouth 2 (two) times daily as needed. 09/03/20  Yes [provider]  Lancets MISC Frequency:PRN   Dosage:0.0     Instructions:  Note:Dose: 1 01/19/09  Yes [provider]  linaclotide (LINZESS) 145 MCG CAPS capsule Take 1 capsule (145 mcg total) by mouth daily before breakfast. 11/16/20  Yes Virgel Manifold, MD  losartan (COZAAR) 25 MG tablet Take 1 tablet (25 mg total) by mouth daily. 09/25/20  Yes Vigg, Avanti, MD  metFORMIN (GLUCOPHAGE) 500 MG tablet Take 1 tablet (500 mg total) by mouth 2 (two) times daily with a meal. 09/25/20  Yes Vigg, Avanti, MD  mirtazapine (REMERON) 30 MG tablet Take 30 mg by mouth. 03/31/15  Yes [provider]  Na Sulfate-K Sulfate-Mg Sulf 17.5-3.13-1.6 GM/177ML SOLN Take 354 mLs by mouth once for 1 dose. 11/16/20 11/16/20 Yes Lisa Antigua B, MD  oxyCODONE (OXY IR/ROXICODONE)  5 MG immediate release tablet Take 5 mg by mouth 2 (two) times daily as needed. 09/18/20  Yes [provider]  pantoprazole (PROTONIX) 40 MG tablet Take 1 tablet by mouth daily. 05/29/20  Yes [provider]  promethazine (PHENERGAN) 12.5 MG tablet Take 1 tablet (12.5 mg total) by mouth every 8 (eight) hours as needed for nausea or vomiting. 11/01/20  Yes McElwee, Lauren A, NP  Teriparatide, Recombinant, 620 MCG/2.48ML SOPN Inject into the skin at bedtime. 07/12/20  Yes [provider]  tiZANidine (ZANAFLEX) 4 MG tablet Take 4 mg by mouth at bedtime.   Yes [provider]  venlafaxine XR (EFFEXOR-XR) 150 MG 24 hr capsule Take 1 capsule (150 mg total) by mouth in the morning and at bedtime. 11/09/20  Yes Jon Billings, NP  vitamin C (ASCORBIC ACID) 500 MG tablet Take 500 mg by mouth daily.   Yes [provider]  zinc gluconate 50 MG tablet Take 50 mg by mouth daily.   Yes [provider]  amLODipine (NORVASC) 2.5 MG tablet Take 1 tablet (2.5 mg total) by mouth daily. Patient not taking: Reported on 11/16/2020 10/05/20   Charlynne Cousins, MD  aspirin EC 81 MG tablet Take 1 tablet (81 mg total) by mouth daily. Swallow whole. Patient not taking: Reported on 11/16/2020 10/26/20   End, Harrell Gave, MD  fenofibrate (TRICOR) 145 MG tablet Take 1 tablet (145 mg total) by mouth daily. Patient not taking: Reported on 11/16/2020 09/26/20   Charlynne Cousins, MD  insulin glargine (LANTUS) 100 UNIT/ML injection Inject 20 Units into the skin daily. Patient not taking: Reported on 11/16/2020    [provider]    Family History  Problem Relation Age of Onset   Heart attack Mother 40   Emphysema Father    Heart attack Father 57   Heart disease Father    Cancer Sister      Social History   Tobacco Use   Smoking status: Every Day    Pack years: 0.00    Types: E-cigarettes   Smokeless tobacco: Never   Tobacco comments:    Smoked cigarettes for 18 years (2  PPD)  Vaping Use   Vaping Use: Every day  Substance Use Topics   Alcohol use: Never   Drug use: Never    Allergies as of 11/16/2020 - Review Complete 11/16/2020  Allergen Reaction Noted   Sitagliptin Anaphylaxis 09/09/2012   Atorvastatin Other (See Comments) and Rash 09/09/2012   Bupropion Other (See Comments) 09/09/2012   Nitrofurantoin macrocrystal Rash 09/10/2012   Other Hives 10/31/2016   Pravastatin Other (See Comments) 10/20/2014   Ciprofloxacin Other (See Comments) 09/09/2012   Exenatide Rash 09/09/2012   Ezetimibe Rash 09/09/2012   Fluconazole Rash 09/09/2012   Morphine Rash 09/09/2012   Penicillins Rash 06/26/2020   Prednisone  Rash 09/09/2012   Simvastatin Rash 09/09/2012   Sulfa antibiotics Rash 06/26/2020   Trazodone Rash 09/09/2012    Review of Systems:    All systems reviewed and negative except where noted in HPI.   Physical Exam:  Constitutional: General:   Alert,  Well-developed, well-nourished, pleasant and cooperative in NAD BP (!) 145/83 (BP Location: Left Arm, Patient Position: Sitting, Cuff Size: Normal)   Pulse 64   Temp (!) 97.5 F (36.4 C) (Oral)   Ht _0  (1.626 m)   Wt 178 lb 4 oz (80.9 kg)   BMI 30.60 kg/m   Eyes:  Sclera clear, no icterus.   Conjunctiva pink. PERRLA  Ears:  No scars, lesions or masses, Normal auditory acuity. Nose:  No deformity, discharge, or lesions. Mouth:  No deformity or lesions, oropharynx pink & moist.  Neck:  Supple; no masses or thyromegaly.  Respiratory: Normal respiratory effort, Normal percussion  Gastrointestinal:  Normal bowel sounds.  No bruits.  Soft, non-tender and non-distended without masses, hepatosplenomegaly or hernias noted.  No guarding or rebound tenderness.     Cardiac: No clubbing or edema.  No cyanosis. Normal posterior tibial pedal pulses noted.  Lymphatic:  No significant cervical or axillary adenopathy.  Psych:  Alert and cooperative. Normal mood and affect.  Musculoskeletal:   Normal gait. Head normocephalic, atraumatic. Symmetrical without gross deformities. 5/5 Upper and Lower extremity strength bilaterally.  Skin: Warm. Intact without significant lesions or rashes. No jaundice.  Neurologic:  Face symmetrical, tongue midline, Normal sensation to touch;  grossly normal neurologically.  Psych:  Alert and oriented x3, Alert and cooperative. Normal mood and affect.   Labs: CBC    Component Value Date/Time   WBC 5.9 09/25/2020 1057   RBC 4.49 09/25/2020 1057   HGB 13.6 09/25/2020 1057   HCT 40.4 09/25/2020 1057   PLT 195 09/25/2020 1057   MCV 90 09/25/2020 1057   MCH 30.3 09/25/2020 1057   MCHC 33.7 09/25/2020 1057   RDW 12.2 09/25/2020 1057   LYMPHSABS 1.9 09/25/2020 1057   EOSABS 0.2 09/25/2020 1057   BASOSABS 0.0 09/25/2020 1057   CMP     Component Value Date/Time   NA 144 09/25/2020 1057   K 4.7 09/25/2020 1057   CL 105 09/25/2020 1057   CO2 22 09/25/2020 1057   GLUCOSE 154 (H) 09/25/2020 1057   BUN 13 09/25/2020 1057   CREATININE 0.60 09/25/2020 1057   CALCIUM 10.5 (H) 09/25/2020 1057   PROT 6.7 09/25/2020 1057   ALBUMIN 4.1 09/25/2020 1057   AST 16 09/25/2020 1057   ALT 20 09/25/2020 1057   ALKPHOS 151 (H) 09/25/2020 1057   BILITOT 0.2 09/25/2020 1057    Imaging Studies: CT abdomen shows history of Roux-en-Y gastric bypass surgery.  Fatty liver  Assessment and Plan:   Lisa Matthews is a 65 y.o. y/o female has been referred for constipation and nausea vomiting  Patient has not had a screening colonoscopy in over 10 years Last colonoscopy was not able to be done due to patient still producing stool after a colonoscopy prep.  Therefore, will order extended prep.  This was discussed in detail with the patient.  Will start Linzess for constipation as other measures have not helped  EGD indicated for abdominal pain and nausea vomiting  I have discussed alternative options, risks & benefits,  which include, but are not limited to,  bleeding, infection, perforation,respiratory complication & drug reaction.  The patient agrees with this plan & written consent  will be obtained.    Alternative options of conservative management were discussed in detail, including but not limited to medication management, foregoing endoscopic procedures at this time and others.    Alk phos was noted to be mildly elevated recently.  Repeat and obtain GGT.  Further fatty liver work-up to be done on next visit   Dr Lisa Antigua  Speech recognition software was used to dictate the above note.

## 2020-11-17 ENCOUNTER — Encounter: Payer: Self-pay | Admitting: Urology

## 2020-11-20 MED ORDER — ATENOLOL 50 MG PO TABS: 50.0000 mg | ORAL_TABLET | Freq: Every day | ORAL | 5 refills | Status: DC

## 2020-11-21 ENCOUNTER — Ambulatory Visit: Payer: Medicare Other | Admitting: Nurse Practitioner

## 2020-11-21 ENCOUNTER — Telehealth: Payer: Self-pay | Admitting: Gastroenterology

## 2020-11-21 LAB — GAMMA GT: GGT: 16 IU/L (ref 0–60)

## 2020-11-21 LAB — HEPATIC FUNCTION PANEL
ALT: 17 IU/L (ref 0–32)
AST: 19 IU/L (ref 0–40)
Albumin: 4.2 g/dL (ref 3.8–4.8)
Alkaline Phosphatase: 116 IU/L (ref 44–121)
Bilirubin Total: <0.2 mg/dL (ref 0.0–1.2)
Bilirubin, Direct: <0.1 mg/dL (ref 0.00–0.40)
Total Protein: 6.8 g/dL (ref 6.0–8.5)

## 2020-11-21 NOTE — Telephone Encounter (Signed)
Left message on voicemail.

## 2020-11-21 NOTE — Telephone Encounter (Signed)
Please calncel her procedure

## 2020-11-22 ENCOUNTER — Encounter: Payer: Self-pay | Admitting: Gastroenterology

## 2020-11-22 ENCOUNTER — Other Ambulatory Visit: Payer: Self-pay

## 2020-11-22 ENCOUNTER — Encounter: Payer: Self-pay | Admitting: Internal Medicine

## 2020-11-22 ENCOUNTER — Ambulatory Visit (INDEPENDENT_AMBULATORY_CARE_PROVIDER_SITE_OTHER): Payer: Medicare Other | Admitting: Internal Medicine

## 2020-11-22 VITALS — BP 146/82 | HR 65 | Temp 98.2°F | Wt 177.8 lb

## 2020-11-22 DIAGNOSIS — R309 Painful micturition, unspecified: Secondary | ICD-10-CM | POA: Diagnosis not present

## 2020-11-22 DIAGNOSIS — H9319 Tinnitus, unspecified ear: Secondary | ICD-10-CM | POA: Diagnosis not present

## 2020-11-22 LAB — URINALYSIS, ROUTINE W REFLEX MICROSCOPIC
Bilirubin, UA: NEGATIVE
Ketones, UA: NEGATIVE
Nitrite, UA: NEGATIVE
Protein,UA: NEGATIVE
RBC, UA: NEGATIVE
Specific Gravity, UA: 1.02 (ref 1.005–1.030)
Urobilinogen, Ur: 0.2 mg/dL (ref 0.2–1.0)
pH, UA: 5.5 (ref 5.0–7.5)

## 2020-11-22 LAB — MICROSCOPIC EXAMINATION

## 2020-11-22 MED ORDER — CEPHALEXIN 500 MG PO CAPS: 500.0000 mg | ORAL_CAPSULE | Freq: Two times a day (BID) | ORAL | 0 refills | Status: AC

## 2020-11-22 NOTE — Progress Notes (Signed)
BP (!) 146/82   Pulse 65   Temp 98.2 F (36.8 C) (Oral)   Wt 177 lb 12.8 oz (80.6 kg)   SpO2 97%   BMI 30.52 kg/m    Subjective:    Patient ID: Lisa Matthews, female    DOB: [birth year], 65 y.o.   MRN: [id number]  Chief Complaint  Patient presents with   Burning when urinating    Has been having burning with urination for about a week, has taken Azo with no relief   Headache   Sinus Problem    For the past week, having sinus pressure around her eys   Cough   Nasal Congestion    Only happens when she goes out side.    HPI: Lisa Matthews is a 65 y.o. female  Urinary Tract Infection  This is a recurrent problem. The current episode started in the past 7 days. The quality of the pain is described as burning. The pain is at a severity of 5/10. The pain is mild. There has been no fever. Pertinent negatives include no chills, discharge, flank pain, frequency, hematuria, hesitancy, nausea, possible pregnancy, sweats, urgency or vomiting. Her past medical history is significant for recurrent UTIs.   Chief Complaint  Patient presents with   Burning when urinating    Has been having burning with urination for about a week, has taken Azo with no relief   Headache   Sinus Problem    For the past week, having sinus pressure around her eys   Cough   Nasal Congestion    Only happens when she goes out side.    Relevant past medical, surgical, family and social history reviewed and updated as indicated. Interim medical history since our last visit reviewed. Allergies and medications reviewed and updated.  Review of Systems  Constitutional:  Negative for chills.  Gastrointestinal:  Negative for nausea and vomiting.  Genitourinary:  Negative for flank pain, frequency, hematuria, hesitancy and urgency.   Per HPI unless specifically indicated above     Objective:    BP (!) 146/82   Pulse 65   Temp 98.2 F (36.8 C) (Oral)   Wt 177 lb 12.8 oz (80.6 kg)   SpO2 97%   BMI 30.52  kg/m   Wt Readings from Last 3 Encounters:  11/22/20 177 lb 12.8 oz (80.6 kg)  11/16/20 178 lb 4 oz (80.9 kg)  11/01/20 179 lb 3.2 oz (81.3 kg)    Physical Exam Vitals and nursing note reviewed.  Constitutional:      General: She is not in acute distress.    Appearance: Normal appearance. She is not ill-appearing or diaphoretic.  HENT:     Head: Normocephalic and atraumatic.     Right Ear: Tympanic membrane and external ear normal. There is no impacted cerumen.     Left Ear: External ear normal.     Nose: No congestion or rhinorrhea.     Mouth/Throat:     Pharynx: No oropharyngeal exudate or posterior oropharyngeal erythema.  Eyes:     Conjunctiva/sclera: Conjunctivae normal.     Pupils: Pupils are equal, round, and reactive to light.  Cardiovascular:     Rate and Rhythm: Normal rate and regular rhythm.     Heart sounds: No murmur heard.   No friction rub. No gallop.  Pulmonary:     Effort: No respiratory distress.     Breath sounds: No stridor. No wheezing or rhonchi.  Chest:     Chest wall: No  tenderness.  Abdominal:     General: Abdomen is flat. Bowel sounds are normal. There is no distension.     Palpations: Abdomen is soft. There is no mass.     Tenderness: There is no abdominal tenderness. There is no guarding.  Musculoskeletal:        General: No swelling or deformity.     Cervical back: Normal range of motion and neck supple. No rigidity or tenderness.     Right lower leg: No edema.     Left lower leg: No edema.  Skin:    General: Skin is warm and dry.     Coloration: Skin is not jaundiced.     Findings: No erythema.  Neurological:     Mental Status: She is alert and oriented to person, place, and time. Mental status is at baseline.  Psychiatric:        Mood and Affect: Mood normal.        Behavior: Behavior normal.        Thought Content: Thought content normal.        Judgment: Judgment normal.    Results for orders placed or performed in visit on  11/16/20  Hepatic function panel  Result Value Ref Range   Total Protein 6.8 6.0 - 8.5 g/dL   Albumin 4.2 3.8 - 4.8 g/dL   Bilirubin Total <0.2 0.0 - 1.2 mg/dL   Bilirubin, Direct <0.10 0.00 - 0.40 mg/dL   Alkaline Phosphatase 116 44 - 121 IU/L   AST 19 0 - 40 IU/L   ALT 17 0 - 32 IU/L  Gamma GT  Result Value Ref Range   GGT 16 0 - 60 IU/L        Current Outpatient Medications:    ACCU-CHEK GUIDE test strip, USE TO CHECK BLOOD SUGAR 4 TIMES DAILY, Disp: , Rfl:    acetaminophen (TYLENOL) 650 MG CR tablet, Take by mouth as needed., Disp: , Rfl:    amLODipine (NORVASC) 2.5 MG tablet, Take 1 tablet (2.5 mg total) by mouth daily., Disp: 30 tablet, Rfl: 3   aspirin EC 81 MG tablet, Take 1 tablet (81 mg total) by mouth daily. Swallow whole., Disp: 90 tablet, Rfl: 3   atenolol (TENORMIN) 50 MG tablet, Take 1 tablet (50 mg total) by mouth daily., Disp: 30 tablet, Rfl: 5   Biotin w/ Vitamins C & E (HAIR/SKIN/NAILS PO), Take by mouth daily., Disp: , Rfl:    Blood Glucose Monitoring Suppl (ACCU-CHEK GUIDE ME) w/Device KIT, CHECK BLOOD SUGAR 4 TIMES DAILY, Disp: , Rfl:    Calcium Carbonate-Vitamin D 600-400 MG-UNIT tablet, Take 1 tablet by mouth daily., Disp: , Rfl:    cephALEXin (KEFLEX) 500 MG capsule, Take 1 capsule (500 mg total) by mouth in the morning and at bedtime for 7 days., Disp: 14 capsule, Rfl: 0   Cholecalciferol 25 MCG (1000 UT) tablet, Take 1,000 Units by mouth daily., Disp: , Rfl:    cyanocobalamin 1000 MCG tablet, Take 1,000 mcg by mouth daily., Disp: , Rfl:    dapagliflozin propanediol (FARXIGA) 5 MG TABS tablet, Take 1 tablet (5 mg total) by mouth daily before breakfast., Disp: 30 tablet, Rfl: 6   ELDERBERRY PO, Take by mouth daily., Disp: , Rfl:    fenofibrate (TRICOR) 145 MG tablet, Take 1 tablet (145 mg total) by mouth daily., Disp: 30 tablet, Rfl: 6   gabapentin (NEURONTIN) 300 MG capsule, Take 1 capsule (300 mg total) by mouth 2 (two) times daily., Disp: 60 capsule,  Rfl:  3   ibuprofen (ADVIL) 600 MG tablet, Take 600 mg by mouth 2 (two) times daily as needed., Disp: , Rfl:    insulin glargine (LANTUS) 100 UNIT/ML injection, Inject 20 Units into the skin daily., Disp: , Rfl:    Lancets MISC, Frequency:PRN   Dosage:0.0     Instructions:  Note:Dose: 1, Disp: , Rfl:    linaclotide (LINZESS) 145 MCG CAPS capsule, Take 1 capsule (145 mcg total) by mouth daily before breakfast., Disp: 90 capsule, Rfl: 1   losartan (COZAAR) 25 MG tablet, Take 1 tablet (25 mg total) by mouth daily., Disp: 30 tablet, Rfl: 3   metFORMIN (GLUCOPHAGE) 500 MG tablet, Take 1 tablet (500 mg total) by mouth 2 (two) times daily with a meal., Disp: 60 tablet, Rfl: 3   mirtazapine (REMERON) 30 MG tablet, Take 30 mg by mouth., Disp: , Rfl:    oxyCODONE (OXY IR/ROXICODONE) 5 MG immediate release tablet, Take 5 mg by mouth 2 (two) times daily as needed., Disp: , Rfl:    pantoprazole (PROTONIX) 40 MG tablet, Take 1 tablet by mouth daily., Disp: , Rfl:    promethazine (PHENERGAN) 12.5 MG tablet, Take 1 tablet (12.5 mg total) by mouth every 8 (eight) hours as needed for nausea or vomiting., Disp: 20 tablet, Rfl: 0   Teriparatide, Recombinant, 620 MCG/2.48ML SOPN, Inject into the skin at bedtime., Disp: , Rfl:    tiZANidine (ZANAFLEX) 4 MG tablet, Take 4 mg by mouth at bedtime., Disp: , Rfl:    venlafaxine XR (EFFEXOR-XR) 150 MG 24 hr capsule, Take 1 capsule (150 mg total) by mouth in the morning and at bedtime., Disp: 180 capsule, Rfl: 1   vitamin C (ASCORBIC ACID) 500 MG tablet, Take 500 mg by mouth daily., Disp: , Rfl:    zinc gluconate 50 MG tablet, Take 50 mg by mouth daily., Disp: , Rfl:     Assessment & Plan:  Constipation better on linzess, to fu with GI for such  2. Uti with +ve nitrites.  Recurrent UTI check UA.  pt is currently symptomatic for an Urinary tract infection(abd pain, burning etc), will cover with emperic abx, see med module for details.  encouraged to increase water/fluid  intake.Signs and symptoms of emergency were discussed with the patient. The risks, benefits and side effects of treatment were discussed with the patient. The patient verbalized an understanding of plan, and was told to call the clinic/go to the ED if symptoms worsen at any point of time.  3. Tinnitus :  MRI in the recent past. 6/22 Mildly motion degraded exam.  No evidence of acute intracranial abnormality.  No cerebellopontine angle or internal auditory canal mass is identified.  Chronic left basal ganglia lacunar infarct.  Moderate multifocal T2/FLAIR hyperintensity within the cerebral white matter and pons, nonspecific but likely reflecting chronic small vessel ischemic disease given the patient's history of diabetes and hypertension.  Problem List Items Addressed This Visit   None Visit Diagnoses     Painful urination    -  Primary   Relevant Orders   Urinalysis, Routine w reflex microscopic   Urine Culture        Orders Placed This Encounter  Procedures   Urine Culture   Urinalysis, Routine w reflex microscopic     Meds ordered this encounter  Medications   cephALEXin (KEFLEX) 500 MG capsule    Sig: Take 1 capsule (500 mg total) by mouth in the morning and at bedtime for 7 days.  Dispense:  14 capsule    Refill:  0     Follow up plan: No follow-ups on file.

## 2020-11-23 ENCOUNTER — Encounter: Payer: Self-pay | Admitting: Anesthesiology

## 2020-11-23 ENCOUNTER — Encounter: Admission: RE | Payer: Self-pay | Source: Home / Self Care

## 2020-11-23 ENCOUNTER — Ambulatory Visit: Admission: RE | Admit: 2020-11-23 | Payer: Medicare Other | Source: Home / Self Care | Admitting: Gastroenterology

## 2020-11-23 SURGERY — COLONOSCOPY WITH PROPOFOL
Anesthesia: General

## 2020-11-23 MED ORDER — PROPOFOL 500 MG/50ML IV EMUL
INTRAVENOUS | Status: AC
  Filled 2020-11-23: qty 50

## 2020-11-26 LAB — URINE CULTURE

## 2020-11-28 NOTE — Telephone Encounter (Signed)
Has already been canceled

## 2020-11-30 ENCOUNTER — Telehealth: Payer: Self-pay

## 2020-11-30 ENCOUNTER — Encounter: Payer: Self-pay | Admitting: Internal Medicine

## 2020-11-30 ENCOUNTER — Other Ambulatory Visit: Payer: Self-pay

## 2020-11-30 MED ORDER — TIZANIDINE HCL 4 MG PO TABS: 4.0000 mg | ORAL_TABLET | Freq: Every day | ORAL | 1 refills | Status: DC

## 2020-11-30 NOTE — Telephone Encounter (Signed)
Returned Patient's call, she stated that she needed a refill of Zanaflex 4mg  1 tab q8hr prn for muscle spasms.  Patient also states that she had just got bitten by a dog and does not know if it has any rabies vaccination, Recommended that she go to the ER for the dog bite since she did not know if the dog was vaccinated against Rabies and to have it evaluated.

## 2020-11-30 NOTE — Telephone Encounter (Signed)
Patient requesting refill for Zanaflex 4 mg. Refill sent to [facility], pending Dr's approval

## 2020-11-30 NOTE — Telephone Encounter (Signed)
Requesting refill of Zanaflex 4mg  was sent in to [facility] [address]

## 2020-12-01 DIAGNOSIS — E119 Type 2 diabetes mellitus without complications: Secondary | ICD-10-CM | POA: Diagnosis not present

## 2020-12-05 DIAGNOSIS — M545 Low back pain, unspecified: Secondary | ICD-10-CM | POA: Diagnosis not present

## 2020-12-05 DIAGNOSIS — Z79891 Long term (current) use of opiate analgesic: Secondary | ICD-10-CM | POA: Diagnosis not present

## 2020-12-05 DIAGNOSIS — M25532 Pain in left wrist: Secondary | ICD-10-CM | POA: Diagnosis not present

## 2020-12-05 DIAGNOSIS — G894 Chronic pain syndrome: Secondary | ICD-10-CM | POA: Diagnosis not present

## 2020-12-07 ENCOUNTER — Other Ambulatory Visit: Payer: Self-pay

## 2020-12-07 ENCOUNTER — Encounter: Payer: Self-pay | Admitting: Student in an Organized Health Care Education/Training Program

## 2020-12-07 ENCOUNTER — Ambulatory Visit
Payer: Medicare Other | Attending: Student in an Organized Health Care Education/Training Program | Admitting: Student in an Organized Health Care Education/Training Program

## 2020-12-07 VITALS — BP 147/87 | HR 69 | Temp 97.3°F | Resp 18 | Ht 64.0 in | Wt 175.0 lb

## 2020-12-07 DIAGNOSIS — Z79891 Long term (current) use of opiate analgesic: Secondary | ICD-10-CM | POA: Diagnosis not present

## 2020-12-07 DIAGNOSIS — Z8781 Personal history of (healed) traumatic fracture: Secondary | ICD-10-CM | POA: Insufficient documentation

## 2020-12-07 DIAGNOSIS — S7291XS Unspecified fracture of right femur, sequela: Secondary | ICD-10-CM | POA: Insufficient documentation

## 2020-12-07 DIAGNOSIS — Z9889 Other specified postprocedural states: Secondary | ICD-10-CM | POA: Insufficient documentation

## 2020-12-07 DIAGNOSIS — F1190 Opioid use, unspecified, uncomplicated: Secondary | ICD-10-CM | POA: Insufficient documentation

## 2020-12-07 DIAGNOSIS — Z8673 Personal history of transient ischemic attack (TIA), and cerebral infarction without residual deficits: Secondary | ICD-10-CM | POA: Insufficient documentation

## 2020-12-07 DIAGNOSIS — G894 Chronic pain syndrome: Secondary | ICD-10-CM | POA: Diagnosis not present

## 2020-12-07 NOTE — Progress Notes (Signed)
Safety precautions to be maintained throughout the outpatient stay will include: orient to surroundings, keep bed in low position, maintain call bell within reach at all times, provide assistance with transfer out of bed and ambulation.  

## 2020-12-07 NOTE — Progress Notes (Signed)
Patient: Lisa Matthews  Service Category: E/M  Provider: Gillis Santa, MD  DOB: 10/13/1955  DOS: 12/07/2020  Referring Provider: Charlynne Cousins, MD  MRN: [id number]  Setting: Ambulatory outpatient  PCP: Charlynne Cousins, MD  Type: New Patient  Specialty: Interventional Pain Management    Location: Office  Delivery: Face-to-face     Primary Reason(s) for Visit: Encounter for initial evaluation of one or more chronic problems (new to examiner) potentially causing chronic pain, and posing a threat to normal musculoskeletal function. (Level of risk: High) CC: Leg Pain (right) and Arm Pain (bilateral)  HPI  [name] is a 65 y.o. year old, female patient, who comes for the first time to our practice referred by Charlynne Cousins, MD for our initial evaluation of her chronic pain. She has Abnormal Pap smear of cervix; Abnormal results of thyroid function studies; Achilles tendinitis; Acquired equinus deformity of foot; Advance directive discussed with patient; Anxiety; At high risk for falls; Chronic constipation; Chronic nonalcoholic liver disease; Chronic, continuous use of opioids; Family history of osteoporosis; Chronic pain syndrome; Hyperlipidemia; Hx of stroke without residual deficits; Haglund's deformity; Essential hypertension; Kidney stones, calcium oxalate; Left sided abdominal pain; Liver disorder; Low back pain; Memory loss; Obesity (BMI 30-39.9); Morbid obesity ([place]); Myopia; Obstructive chronic bronchitis ([place]); Obstructive sleep apnea; Orthostatic hypotension; Other depressive disorder; Pain in joint, lower leg; Personal history of kidney stones; Postmenopausal osteoporosis; Preop cardiovascular exam; Primary osteoarthritis of knee; Bariatric surgery status; Type 2 diabetes mellitus, with long-term current use of insulin ([place]); Vitamin D deficiency, unspecified; Type 2 diabetes mellitus ([place]); Atypical chest pain; Hyperlipidemia associated with type 2 diabetes mellitus ([place]); and Fibromyalgia on their  problem list. Today she comes in for evaluation of her Leg Pain (right) and Arm Pain (bilateral)  Pain Assessment: Location: Right Leg Radiating: radiates from right hip all the way down leg Onset: More than a month ago Duration: Chronic pain Quality: Throbbing, Sharp, Radiating Severity: 7 /10 (subjective, self-reported pain score)  Effect on ADL: limits activities Timing: Constant Modifying factors: medication BP: (!) 147/87  HR: 69  Onset and Duration: Gradual Cause of pain:  12/22/2019 broken Leg Severity: Getting worse, NAS-11 at its worse: 8/10, and NAS-11 at its best: 5/10 Timing: Morning, Afternoon, Night, During activity or exercise, and After activity or exercise Aggravating Factors: Bending, Climbing, Intercourse (sex), Kneeling, Lifiting, Prolonged standing, Squatting, Stooping , Surgery made it worse, Twisting, Walking, Walking uphill, Walking downhill, and Working Alleviating Factors: Lying down, Resting, and Sitting Associated Problems: Fatigue and Pain that wakes patient up Quality of Pain: Aching, Disabling, and Getting longer Previous Examinations or Tests: Bone scan, CT scan, X-rays, and Orthopedic evaluation Previous Treatments: Narcotic medications  Lisa Matthews is a 65 year old female who presents with a chief complaint of right hip and right leg pain in the context of a right femur fracture that she sustained secondary to a fall status post open reduction internal fixation with lateral screws in the lateral plate in place for a comminuted femoral fracture.  This happened on 12/22/2019.  She is being seen by a pain clinic in [place], [place] but states that is too far for her to drive.  She states that she continues to have persistent pain even after her surgery.  She has followed up with her surgeon who states that nothing else remains to be done.  She takes oxycodone 5 mg 3 times daily as needed which she does find analgesic and functional benefit from.  She has tried  physical therapy in  the past with limited response.  She has tried hip injections that did not help her at all and she is not interested in trying any additional injections at this time.  She also takes gabapentin 300 mg 4 times daily along with ibuprofen 600 mg twice daily and tizanidine as needed.  She hopes to have her chronic pain medications managed here given that it is closer to where she lives in [place].  She denies having a history of prior substance abuse.  She also has a history of fibromyalgia.  Historic Controlled Substance Pharmacotherapy Review   Current opioid analgesics: 11/09/2020  11/09/2020   3  Oxycodone Hcl (Ir) 5 Mg Tablet  90.00  30  [facility] [place]  [id number]  Wal ([id number])  0/0  22.50 MME  Medicare  Green     Historical Monitoring: The patient  reports no history of drug use. List of all UDS Test(s): No results found for: MDMA, COCAINSCRNUR, [place], [place], CANNABQUANT, [place], [place] List of other Serum/Urine Drug Screening Test(s):  No results found for: AMPHSCRSER, BARBSCRSER, BENZOSCRSER, COCAINSCRSER, COCAINSCRNUR, PCPSCRSER, PCPQUANT, THCSCRSER, THCU, CANNABQUANT, OPIATESCRSER, OXYSCRSER, PROPOXSCRSER, ETH Historical Background Evaluation: [place] PMP: PDMP not reviewed this encounter. Online review of the past 42-monthperiod conducted.              [place] Department of public safety, offender search: (Editor, commissioningInformation) Non-contributory Risk Assessment Profile: Aberrant behavior: None observed or detected today Risk factors for fatal opioid overdose: None identified today  Fatal overdose hazard ratio (HR): Calculation deferred Non-fatal overdose hazard ratio (HR): Calculation deferred Risk of opioid abuse or dependence: 0.7-3.0% with doses ? 36 MME/day and 6.1-26% with doses ? 120 MME/day. Substance use disorder (SUD) risk level: See below Personal History of Substance Abuse (SUD-Substance use disorder):  Alcohol: Negative  Illegal Drugs: Negative  Rx Drugs: Negative  ORT  Risk Level calculation: Low Risk  Opioid Risk Tool - 12/07/20 1030       Family History of Substance Abuse   Alcohol Negative    Illegal Drugs Negative    Rx Drugs Negative      Personal History of Substance Abuse   Alcohol Negative    Illegal Drugs Negative    Rx Drugs Negative      Age   Age between 157-45years  No      History of Preadolescent Sexual Abuse   History of Preadolescent Sexual Abuse Negative or Female      Psychological Disease   Psychological Disease Negative    Depression Positive      Total Score   Opioid Risk Tool Scoring 1    Opioid Risk Interpretation Low Risk            ORT Scoring interpretation table:  Score <3 = Low Risk for SUD  Score between 4-7 = Moderate Risk for SUD  Score >8 = High Risk for Opioid Abuse   PHQ-2 Depression Scale:  Total score: 0  PHQ-2 Scoring interpretation table: (Score and probability of major depressive disorder)  Score 0 = No depression  Score 1 = 15.4% Probability  Score 2 = 21.1% Probability  Score 3 = 38.4% Probability  Score 4 = 45.5% Probability  Score 5 = 56.4% Probability  Score 6 = 78.6% Probability   PHQ-9 Depression Scale:  Total score: 0  PHQ-9 Scoring interpretation table:  Score 0-4 = No depression  Score 5-9 = Mild depression  Score 10-14 = Moderate depression  Score 15-19 = Moderately severe depression  Score 20-27 = Severe depression (2.4 times higher risk of SUD and 2.89 times higher risk of overuse)   Pharmacologic Plan: As per protocol, I have not taken over any controlled substance management, pending the results of ordered tests and/or consults.            Initial impression: Pending review of available data and ordered tests.  Meds   Current Outpatient Medications:    ACCU-CHEK GUIDE test strip, USE TO CHECK BLOOD SUGAR 4 TIMES DAILY, Disp: , Rfl:    acetaminophen (TYLENOL) 650 MG CR tablet, Take by mouth as needed., Disp: , Rfl:    amLODipine (NORVASC) 2.5 MG tablet, Take 1  tablet (2.5 mg total) by mouth daily., Disp: 30 tablet, Rfl: 3   aspirin EC 81 MG tablet, Take 1 tablet (81 mg total) by mouth daily. Swallow whole., Disp: 90 tablet, Rfl: 3   atenolol (TENORMIN) 50 MG tablet, Take 1 tablet (50 mg total) by mouth daily., Disp: 30 tablet, Rfl: 5   Biotin w/ Vitamins C & E (HAIR/SKIN/NAILS PO), Take by mouth daily., Disp: , Rfl:    Blood Glucose Monitoring Suppl (ACCU-CHEK GUIDE ME) w/Device KIT, CHECK BLOOD SUGAR 4 TIMES DAILY, Disp: , Rfl:    Calcium Carbonate-Vitamin D 600-400 MG-UNIT tablet, Take 1 tablet by mouth daily., Disp: , Rfl:    Cholecalciferol 25 MCG (1000 UT) tablet, Take 1,000 Units by mouth daily., Disp: , Rfl:    cyanocobalamin 1000 MCG tablet, Take 1,000 mcg by mouth daily., Disp: , Rfl:    dapagliflozin propanediol (FARXIGA) 5 MG TABS tablet, Take 1 tablet (5 mg total) by mouth daily before breakfast., Disp: 30 tablet, Rfl: 6   ELDERBERRY PO, Take by mouth daily., Disp: , Rfl:    fenofibrate (TRICOR) 145 MG tablet, Take 1 tablet (145 mg total) by mouth daily., Disp: 30 tablet, Rfl: 6   gabapentin (NEURONTIN) 300 MG capsule, Take 1 capsule (300 mg total) by mouth 2 (two) times daily. (Patient taking differently: Take 300 mg by mouth 4 (four) times daily.), Disp: 60 capsule, Rfl: 3   ibuprofen (ADVIL) 600 MG tablet, Take 600 mg by mouth 2 (two) times daily as needed., Disp: , Rfl:    linaclotide (LINZESS) 145 MCG CAPS capsule, Take 1 capsule (145 mcg total) by mouth daily before breakfast., Disp: 90 capsule, Rfl: 1   losartan (COZAAR) 25 MG tablet, Take 1 tablet (25 mg total) by mouth daily., Disp: 30 tablet, Rfl: 3   metFORMIN (GLUCOPHAGE) 500 MG tablet, Take 1 tablet (500 mg total) by mouth 2 (two) times daily with a meal., Disp: 60 tablet, Rfl: 3   mirtazapine (REMERON) 30 MG tablet, Take 30 mg by mouth., Disp: , Rfl:    omeprazole (PRILOSEC) 40 MG capsule, Take 40 mg by mouth daily., Disp: , Rfl:    oxyCODONE (OXY IR/ROXICODONE) 5 MG immediate  release tablet, Take 5 mg by mouth 3 (three) times daily as needed., Disp: , Rfl:    pantoprazole (PROTONIX) 40 MG tablet, Take 1 tablet by mouth daily., Disp: , Rfl:    promethazine (PHENERGAN) 12.5 MG tablet, Take 1 tablet (12.5 mg total) by mouth every 8 (eight) hours as needed for nausea or vomiting., Disp: 20 tablet, Rfl: 0   SUPREP BOWEL PREP KIT 17.5-3.13-1.6 GM/177ML SOLN, Take by mouth., Disp: , Rfl:    Teriparatide, Recombinant, 620 MCG/2.48ML SOPN, Inject into the skin at bedtime., Disp: , Rfl:    tiZANidine (ZANAFLEX) 4 MG tablet, Take 1 tablet (  4 mg total) by mouth at bedtime. Needs appt for further refills, Disp: 30 tablet, Rfl: 1   venlafaxine XR (EFFEXOR-XR) 150 MG 24 hr capsule, Take 1 capsule (150 mg total) by mouth in the morning and at bedtime., Disp: 180 capsule, Rfl: 1   vitamin C (ASCORBIC ACID) 500 MG tablet, Take 500 mg by mouth daily., Disp: , Rfl:    zinc gluconate 50 MG tablet, Take 50 mg by mouth daily., Disp: , Rfl:    albuterol (VENTOLIN HFA) 108 (90 Base) MCG/ACT inhaler, Inhale into the lungs. (Patient not taking: Reported on 12/07/2020), Disp: , Rfl:    insulin glargine (LANTUS) 100 UNIT/ML injection, Inject 20 Units into the skin daily. (Patient not taking: Reported on 12/07/2020), Disp: , Rfl:    Lancets MISC, Frequency:PRN   Dosage:0.0     Instructions:  Note:Dose: 1 (Patient not taking: Reported on 12/07/2020), Disp: , Rfl:   Imaging Review  /22  DG Hand Complete Left  Narrative CLINICAL DATA:  [place] 5 days ago, pain at base of thumb  EXAM: LEFT HAND - COMPLETE 3+ VIEW  COMPARISON:  None.  FINDINGS: Frontal, oblique, and lateral views of the left hand are obtained. No fracture, subluxation, or dislocation. Mild diffuse joint space narrowing involving the first metacarpophalangeal joint and diffusely throughout the distal interphalangeal joints, consistent with osteoarthritis. Soft tissues are unremarkable.  IMPRESSION: 1. Mild multifocal  osteoarthritis.  No acute bony abnormality.   Electronically Signed By: Randa Ngo M.D. On: 11/01/2020 23:37  Anatomical Region Laterality Modality  Hip -- Computed Radiography  Pelvis -- --    Impression     Stable postoperative changes. No acute abnormality seen  Narrative  XR HIP RIGHT WWO PELVIS 4 VIEWS, 01/26/2020 12:42 PM   INDICATION: RIGHT HIP PAIN \ M25.551 Right hip pain  pt. right hip has been "popping" since her surgery 7 weeks ago  COMPARISON: Previous study dated 01/02/2020.    FINDINGS:   No significant change in the previous study.   Metallic hardware consistent with ORIF fracture with lateral plate and screws seen extending into the femoral neck and head as well as into the proximal shaft of the femur.   No complication evident. No acute fractures seen. Visualized bony pelvic ringappears intact.  Procedure Note  Brandt Loosen, MD - 01/26/2020  Formatting of this note might be different from the original.  XR HIP RIGHT WWO PELVIS 4 VIEWS, 01/26/2020 12:42 PM   INDICATION: RIGHT HIP PAIN \ M25.551 Right hip pain  pt. right hip has been "popping" since her surgery 7 weeks ago  COMPARISON: Previous study dated 01/02/2020.    FINDINGS:   No significant change in the previous study.   Metallic hardware consistent with ORIF fracture with lateral plate and screws seen extending into the femoral neck and head as well as into the proximal shaft of the femur.   No complication evident. No acute fractures seen. Visualized bony pelvic ring appears intact.     XR THIGH/FEMUR RIGHT 2V  Anatomical Region Laterality Modality  Hip -- Computed Radiography  Thigh -- --  Knee -- --    Impression  :   Status post ORIF of comminuted femoral fracture with lateral plate and screwsand total knee replacement. No definite new acute bony abnormality appreciated.  Narrative  X-RAY RIGHT FEMUR, 01/02/2020 12:18 AM   INDICATION: right leg pain    ADDITIONAL HISTORY: None.   COMPARISON: Previous right femur films dated 12/19/2019   VIEWS:  4 FINDINGS:   Patient is undergone interval ORIF of a comminuted femoral fracture with lateral plate and screws. Patient is status post total knee replacement.There are some screw holes seen in the mid to distal shaft of the right femur. There are surgical skin clips seen in the lateral thigh.There is a linear lucency involving the inferior aspect of the tibial tuberosity which is unchanged when compared to the preoperative study. No potential new fracturesare identified when compared to the previous study.  Procedure Note  Brandt Loosen, MD - 01/02/2020  Formatting of this note might be different from the original.  X-RAY RIGHT FEMUR, 01/02/2020 12:18 AM   INDICATION: right leg pain   ADDITIONAL HISTORY: None.   COMPARISON: Previous right femur films dated 12/19/2019   VIEWS: 4 FINDINGS:   Patient is undergone interval ORIF of a comminuted femoral fracture with lateral plate and screws. Patient is status post total knee replacement.There are some screw holes seen in the mid to distal shaft of the right femur. There are surgical skin clips seen in the lateral thigh.There is a linear lucency involving the inferior aspect of the tibial tuberosity which is unchanged when compared to the preoperative study. No potential new fractures are identified when compared to the previous study.   CONCLUSION:  :   Status post ORIF of comminuted femoral fracture with lateral plate and screwsand total knee replacement. No definite new acute bony abnormality appreciated. Exam End: 01/02/20 00:18   Specimen Collected: 01/02/20 09:49 Last Resulted: 01/02/20 10:09  Received From: [facility]  Result Received: 06/26/20 18:58  View Encounter   Complexity Note: Imaging results reviewed. Results shared with Ms. Loll, using Layman's terms.                         ROS   Cardiovascular: High blood pressure and Heart murmur Pulmonary or Respiratory: No reported pulmonary signs or symptoms such as wheezing and difficulty taking a deep full breath (Asthma), difficulty blowing air out (Emphysema), coughing up mucus (Bronchitis), persistent dry cough, or temporary stoppage of breathing during sleep Neurological: No reported neurological signs or symptoms such as seizures, abnormal skin sensations, urinary and/or fecal incontinence, being born with an abnormal open spine and/or a tethered spinal cord Psychological-Psychiatric: No reported psychological or psychiatric signs or symptoms such as difficulty sleeping, anxiety, depression, delusions or hallucinations (schizophrenial), mood swings (bipolar disorders) or suicidal ideations or attempts Gastrointestinal: No reported gastrointestinal signs or symptoms such as vomiting or evacuating blood, reflux, heartburn, alternating episodes of diarrhea and constipation, inflamed or scarred liver, or pancreas or irrregular and/or infrequent bowel movements Genitourinary: Passing kidney stones Hematological: No reported hematological signs or symptoms such as prolonged bleeding, low or poor functioning platelets, bruising or bleeding easily, hereditary bleeding problems, low energy levels due to low hemoglobin or being anemic Endocrine: No reported endocrine signs or symptoms such as high or low blood sugar, rapid heart rate due to high thyroid levels, obesity or weight gain due to slow thyroid or thyroid disease Rheumatologic: Joint aches and or swelling due to excess weight (Osteoarthritis) and Generalized muscle aches (Fibromyalgia) Musculoskeletal: Negative for myasthenia gravis, muscular dystrophy, multiple sclerosis or malignant hyperthermia Work History: Disabled  Allergies  Ms. Ranney is allergic to sitagliptin, atorvastatin, bupropion, nitrofurantoin macrocrystal, other, pravastatin, ciprofloxacin, doxycycline, exenatide,  ezetimibe, fluconazole, morphine, penicillins, prednisone, simvastatin, sulfa antibiotics, and trazodone.  Laboratory Chemistry Profile   Renal Lab Results  Component Value Date  BUN 13 09/25/2020   CREATININE 0.60 09/25/2020   BCR 22 09/25/2020   SPECGRAV 1.020 11/22/2020   PHUR 5.5 11/22/2020   PROTEINUR Negative 11/22/2020     Electrolytes Lab Results  Component Value Date   NA 144 09/25/2020   K 4.7 09/25/2020   CL 105 09/25/2020   CALCIUM 10.5 (H) 09/25/2020     Hepatic Lab Results  Component Value Date   AST 19 11/20/2020   ALT 17 11/20/2020   ALBUMIN 4.2 11/20/2020   ALKPHOS 116 11/20/2020   LIPASE 36 09/25/2020     ID No results found for: LYMEIGGIGMAB, HIV, [place], STAPHAUREUS, MRSAPCR, HCVAB, PREGTESTUR, RMSFIGG, QFVRPH1IGG, QFVRPH2IGG, LYMEIGGIGMAB   Bone No results found for: VD25OH, HT342AJ6OTL, XB2620BT5, HR4163AG5, 25OHVITD1, 25OHVITD2, 25OHVITD3, TESTOFREE, TESTOSTERONE   Endocrine Lab Results  Component Value Date   GLUCOSE 154 (H) 09/25/2020   GLUCOSEU 3+ (A) 11/22/2020   HGBA1C 8.3 (H) 09/25/2020   TSH 2.250 09/25/2020     Neuropathy Lab Results  Component Value Date   HGBA1C 8.3 (H) 09/25/2020     CNS No results found for: COLORCSF, APPEARCSF, RBCCOUNTCSF, WBCCSF, POLYSCSF, LYMPHSCSF, EOSCSF, PROTEINCSF, GLUCCSF, JCVIRUS, CSFOLI, IGGCSF, LABACHR, ACETBL, LABACHR, ACETBL   Inflammation (CRP: Acute  ESR: Chronic) No results found for: CRP, ESRSEDRATE, LATICACIDVEN   Rheumatology No results found for: RF, ANA, LABURIC, URICUR, LYMEIGGIGMAB, LYMEABIGMQN, HLAB27   Coagulation Lab Results  Component Value Date   PLT 195 09/25/2020     Cardiovascular Lab Results  Component Value Date   HGB 13.6 09/25/2020   HCT 40.4 09/25/2020     Screening No results found for: SARSCOV2NAA, COVIDSOURCE, STAPHAUREUS, MRSAPCR, HCVAB, HIV, PREGTESTUR   Cancer No results found for: CEA, CA125, LABCA2   Allergens No results found for:  ALMOND, APPLE, ASPARAGUS, AVOCADO, BANANA, BARLEY, BASIL, B[place], GREENBEAN, LIMABEAN, WHITEBEAN, BEEFIGE, REDBEET, BLUEBERRY, BROCCOLI, CABBAGE, MELON, CARROT, CASEIN, CASHE[place]UT, CAULIFLOWER, CELERY     Note: Lab results reviewed.  [place]  Drug: Ms. Huyett  reports no history of drug use. Alcohol:  reports no history of alcohol use. Tobacco:  reports that she has never smoked. She has never used smokeless tobacco. Medical:  has a past medical history of Allergy, Anxiety, Arthritis, COPD (chronic obstructive pulmonary disease) ([place]), Depression, Diabetes mellitus without complication ([place]), Heart murmur, Hypertension, Kidney stones, and Osteoporosis. Family: family history includes Cancer in her sister; Emphysema in her father; Heart attack (age of onset: 47) in her father; Heart attack (age of onset: 45) in her mother; Heart disease in her father.  Past Surgical History:  Procedure Laterality Date   CESAREAN SECTION     CYSTOSCOPY/URETEROSCOPY/HOLMIUM LASER/STENT PLACEMENT     FRACTURE SURGERY     JOINT REPLACEMENT Right    knee   TUBAL LIGATION     Active Ambulatory Problems    Diagnosis Date Noted   Abnormal Pap smear of cervix 09/23/2014   Abnormal results of thyroid function studies 10/16/2011   Achilles tendinitis 12/30/2011   Acquired equinus deformity of foot 12/30/2011   Advance directive discussed with patient 12/23/2014   Anxiety 06/20/2020   At high risk for falls 06/20/2020   Chronic constipation 10/14/2019   Chronic nonalcoholic liver disease 36/46/8032   Chronic, continuous use of opioids 06/20/2020   Family history of osteoporosis 06/20/2020   Chronic pain syndrome 06/13/2014   Hyperlipidemia 07/20/2007   Hx of stroke without residual deficits 06/20/2020   Haglund's deformity 12/30/2011   Essential hypertension 08/22/2008   Kidney stones, calcium oxalate 02/02/2016  Left sided abdominal pain 10/13/2019   Liver disorder 10/21/2011   Low back pain 09/16/2007    Memory loss 05/21/2017   Obesity (BMI 30-39.9) 06/13/2014   Morbid obesity ([place]) 04/27/2012   Myopia 06/25/2011   Obstructive chronic bronchitis ([place]) 02/05/2012   Obstructive sleep apnea 10/23/2010   Orthostatic hypotension 10/29/2016   Other depressive disorder 08/10/2007   Pain in joint, lower leg 06/13/2014   Personal history of kidney stones 06/20/2020   Postmenopausal osteoporosis 06/20/2020   Preop cardiovascular exam 10/29/2016   Primary osteoarthritis of knee 08/10/2014   Bariatric surgery status 06/13/2014   Type 2 diabetes mellitus, with long-term current use of insulin ([place]) 07/18/2007   Vitamin D deficiency, unspecified 06/13/2014   Type 2 diabetes mellitus ([place]) 09/30/2014   Atypical chest pain 10/27/2020   Hyperlipidemia associated with type 2 diabetes mellitus ([place]) 10/27/2020   Fibromyalgia 06/13/2014   Resolved Ambulatory Problems    Diagnosis Date Noted   No Resolved Ambulatory Problems   Past Medical History:  Diagnosis Date   Allergy    Arthritis    COPD (chronic obstructive pulmonary disease) ([place])    Depression    Diabetes mellitus without complication ([place])    Heart murmur    Hypertension    Kidney stones    Osteoporosis    Constitutional Exam  General appearance: Well nourished, well developed, and well hydrated. In no apparent acute distress Vitals:   12/07/20 1022  BP: (!) 147/87  Pulse: 69  Resp: 18  Temp: (!) 97.3 F (36.3 C)  SpO2: 100%  Weight: 175 lb (79.4 kg)  Height: '5\' 4"'  (1.626 m)   BMI Assessment: Estimated body mass index is 30.04 kg/m as calculated from the following:   Height as of this encounter: '5\' 4"'  (1.626 m).   Weight as of this encounter: 175 lb (79.4 kg).  BMI interpretation table: BMI level Category Range association with higher incidence of chronic pain  <18 kg/m2 Underweight   18.5-24.9 kg/m2 Ideal body weight   25-29.9 kg/m2 Overweight Increased incidence by 20%  30-34.9 kg/m2 Obese (Class I) Increased  incidence by 68%  35-39.9 kg/m2 Severe obesity (Class II) Increased incidence by 136%  >40 kg/m2 Extreme obesity (Class III) Increased incidence by 254%   Patient's current BMI Ideal Body weight  Body mass index is 30.04 kg/m. Ideal body weight: 54.7 kg (120 lb 9.5 oz) Adjusted ideal body weight: 64.6 kg (142 lb 5.7 oz)   BMI Readings from Last 4 Encounters:  12/07/20 30.04 kg/m  11/22/20 30.52 kg/m  11/16/20 30.60 kg/m  11/01/20 30.76 kg/m   Wt Readings from Last 4 Encounters:  12/07/20 175 lb (79.4 kg)  11/22/20 177 lb 12.8 oz (80.6 kg)  11/16/20 178 lb 4 oz (80.9 kg)  11/01/20 179 lb 3.2 oz (81.3 kg)    Psych/Mental status: Alert, oriented x 3 (person, place, & time)       Eyes: PERLA Respiratory: No evidence of acute respiratory distress Lumbar Spine Area Exam  Skin & Axial Inspection: No masses, redness, or swelling Alignment: Symmetrical Functional ROM: Unrestricted ROM       Stability: No instability detected Muscle Tone/Strength: Functionally intact. No obvious neuro-muscular anomalies detected. Sensory (Neurological): Musculoskeletal pain pattern  Gait & Posture Assessment  Ambulation: Unassisted Gait: Antalgic Posture: Difficulty standing up straight, due to pain   Lower Extremity Exam    Side: Right lower extremity  Side: Left lower extremity  Stability: No instability observed  Stability: No instability observed          Skin & Extremity Inspection: Evidence of prior arthroplastic surgery  Skin & Extremity Inspection: Skin color, temperature, and hair growth are WNL. No peripheral edema or cyanosis. No masses, redness, swelling, asymmetry, or associated skin lesions. No contractures.  Functional ROM: Pain restricted ROM right hip joint                  Functional ROM: Unrestricted ROM                  Muscle Tone/Strength: Functionally intact. No obvious neuro-muscular anomalies detected.  Muscle Tone/Strength: Functionally intact. No obvious  neuro-muscular anomalies detected.  Sensory (Neurological): Arthropathic arthralgia        Sensory (Neurological): Unimpaired        DTR: Patellar: 1+: trace Achilles: deferred today Plantar: deferred today  DTR: Patellar: 1+: trace Achilles: deferred today Plantar: deferred today  Palpation: No palpable anomalies  Palpation: No palpable anomalies   Assessment  Primary Diagnosis & Pertinent Problem List: The primary encounter diagnosis was Chronic pain syndrome. Diagnoses of Chronic, continuous use of opioids, Encounter for long-term opiate analgesic use, Hx of stroke without residual deficits, Closed fracture of right femur, unspecified fracture morphology, unspecified portion of femur, sequela, and S/P ORIF (open reduction internal fixation) fracture: RIGHT FEMUR WITH HARDWARE were also pertinent to this visit.  Visit Diagnosis (New problems to examiner): 1. Chronic pain syndrome   2. Chronic, continuous use of opioids   3. Encounter for long-term opiate analgesic use   4. Hx of stroke without residual deficits   5. Closed fracture of right femur, unspecified fracture morphology, unspecified portion of femur, sequela   6. S/P ORIF (open reduction internal fixation) fracture: RIGHT FEMUR WITH HARDWARE    Plan of Care (Initial workup plan)  Note: Ms. Mullinax was reminded that as per protocol, today's visit has been an evaluation only. We have not taken over the patient's controlled substance management.  As is customary for new patients, patient will require baseline urine toxicology screen as well as psychological assessment by Dr Shea Evans for potential risk of substance use disorder.  As long as her UDS and psych eval appropriate, I can take the patient on for chronic opioid therapy.  We can discuss at that time but the best analgesic plan should be.  Patient may be a good candidate for extended release tramadol or buprenorphine transdermal pain patch for pain control.  She is instructed  to continue her multimodal analgesics as prescribed.  I have instructed her to follow-up for her second patient visit after she has completed her psych assessment.  Patient endorsed understanding.  Orders Placed This Encounter  Procedures   Compliance Drug Analysis, Ur    Volume: 30 ml(s). Minimum 3 ml of urine is needed. Document temperature of fresh sample. Indications: Long term (current) use of opiate analgesic (Z79.891) Test#: [id number] (Comprehensive Profile)    Order Specific Question:   Release to patient    Answer:   Immediate   Ambulatory referral to Psychology    Referral Priority:   Routine    Referral Type:   Psychiatric    Referral Reason:   Specialty Services Required    Referred to Provider:   Ursula Alert, MD    Requested Specialty:   Psychiatry    Number of Visits Requested:   1      Lab Orders  Compliance Drug Analysis, Ur    Referral Orders  Ambulatory referral to Psychology     Provider-requested follow-up: Return for call us for 2nd visit after psych eval. I spent a total of 60 minutes reviewing chart data, face-to-face evaluation with the patient, counseling and coordination of care as detailed above.  Future Appointments  Date Time Provider [place]  01/02/2021  9:00 AM [facility]  01/09/2021 11:00 AM Charlynne Cousins, MD [facility]  02/14/2021 11:20 AM End, Harrell Gave, MD [id number]  02/20/2021  2:30 PM Virgel Manifold, MD [facility] None    Note by: Gillis Santa, MD Date: 12/07/2020; Time: 12:00 PM

## 2020-12-11 ENCOUNTER — Emergency Department: Payer: Medicare Other

## 2020-12-11 ENCOUNTER — Emergency Department
Admission: EM | Admit: 2020-12-11 | Discharge: 2020-12-11 | Disposition: A | Discharge status: Home / Self Care | Payer: Medicare Other | Attending: Emergency Medicine | Admitting: Emergency Medicine

## 2020-12-11 ENCOUNTER — Other Ambulatory Visit: Payer: Self-pay

## 2020-12-11 DIAGNOSIS — Y92009 Unspecified place in unspecified non-institutional (private) residence as the place of occurrence of the external cause: Secondary | ICD-10-CM | POA: Insufficient documentation

## 2020-12-11 DIAGNOSIS — Z96651 Presence of right artificial knee joint: Secondary | ICD-10-CM | POA: Diagnosis not present

## 2020-12-11 DIAGNOSIS — S8011XA Contusion of right lower leg, initial encounter: Secondary | ICD-10-CM | POA: Diagnosis not present

## 2020-12-11 DIAGNOSIS — Z043 Encounter for examination and observation following other accident: Secondary | ICD-10-CM | POA: Diagnosis not present

## 2020-12-11 DIAGNOSIS — R6889 Other general symptoms and signs: Secondary | ICD-10-CM | POA: Diagnosis not present

## 2020-12-11 DIAGNOSIS — Z743 Need for continuous supervision: Secondary | ICD-10-CM | POA: Diagnosis not present

## 2020-12-11 DIAGNOSIS — Z7984 Long term (current) use of oral hypoglycemic drugs: Secondary | ICD-10-CM | POA: Insufficient documentation

## 2020-12-11 DIAGNOSIS — W19XXXA Unspecified fall, initial encounter: Secondary | ICD-10-CM

## 2020-12-11 DIAGNOSIS — I10 Essential (primary) hypertension: Secondary | ICD-10-CM | POA: Diagnosis not present

## 2020-12-11 DIAGNOSIS — Z7982 Long term (current) use of aspirin: Secondary | ICD-10-CM | POA: Diagnosis not present

## 2020-12-11 DIAGNOSIS — Z79899 Other long term (current) drug therapy: Secondary | ICD-10-CM | POA: Insufficient documentation

## 2020-12-11 DIAGNOSIS — S0990XA Unspecified injury of head, initial encounter: Secondary | ICD-10-CM | POA: Diagnosis not present

## 2020-12-11 DIAGNOSIS — Z794 Long term (current) use of insulin: Secondary | ICD-10-CM | POA: Insufficient documentation

## 2020-12-11 DIAGNOSIS — E119 Type 2 diabetes mellitus without complications: Secondary | ICD-10-CM | POA: Diagnosis not present

## 2020-12-11 DIAGNOSIS — S8991XA Unspecified injury of right lower leg, initial encounter: Secondary | ICD-10-CM | POA: Diagnosis present

## 2020-12-11 DIAGNOSIS — J449 Chronic obstructive pulmonary disease, unspecified: Secondary | ICD-10-CM | POA: Diagnosis not present

## 2020-12-11 DIAGNOSIS — R0902 Hypoxemia: Secondary | ICD-10-CM | POA: Diagnosis not present

## 2020-12-11 DIAGNOSIS — W01198A Fall on same level from slipping, tripping and stumbling with subsequent striking against other object, initial encounter: Secondary | ICD-10-CM | POA: Insufficient documentation

## 2020-12-11 DIAGNOSIS — S80919A Unspecified superficial injury of unspecified knee, initial encounter: Secondary | ICD-10-CM | POA: Diagnosis not present

## 2020-12-11 IMAGING — CR DG TIBIA/FIBULA 2V*R*
4 series · 4 of 4 positions shown · non-contrast
Comparison: None.

CLINICAL DATA: Fall

EXAM:
RIGHT TIBIA AND FIBULA - 2 VIEW

[tibia ap (1 of 2)]
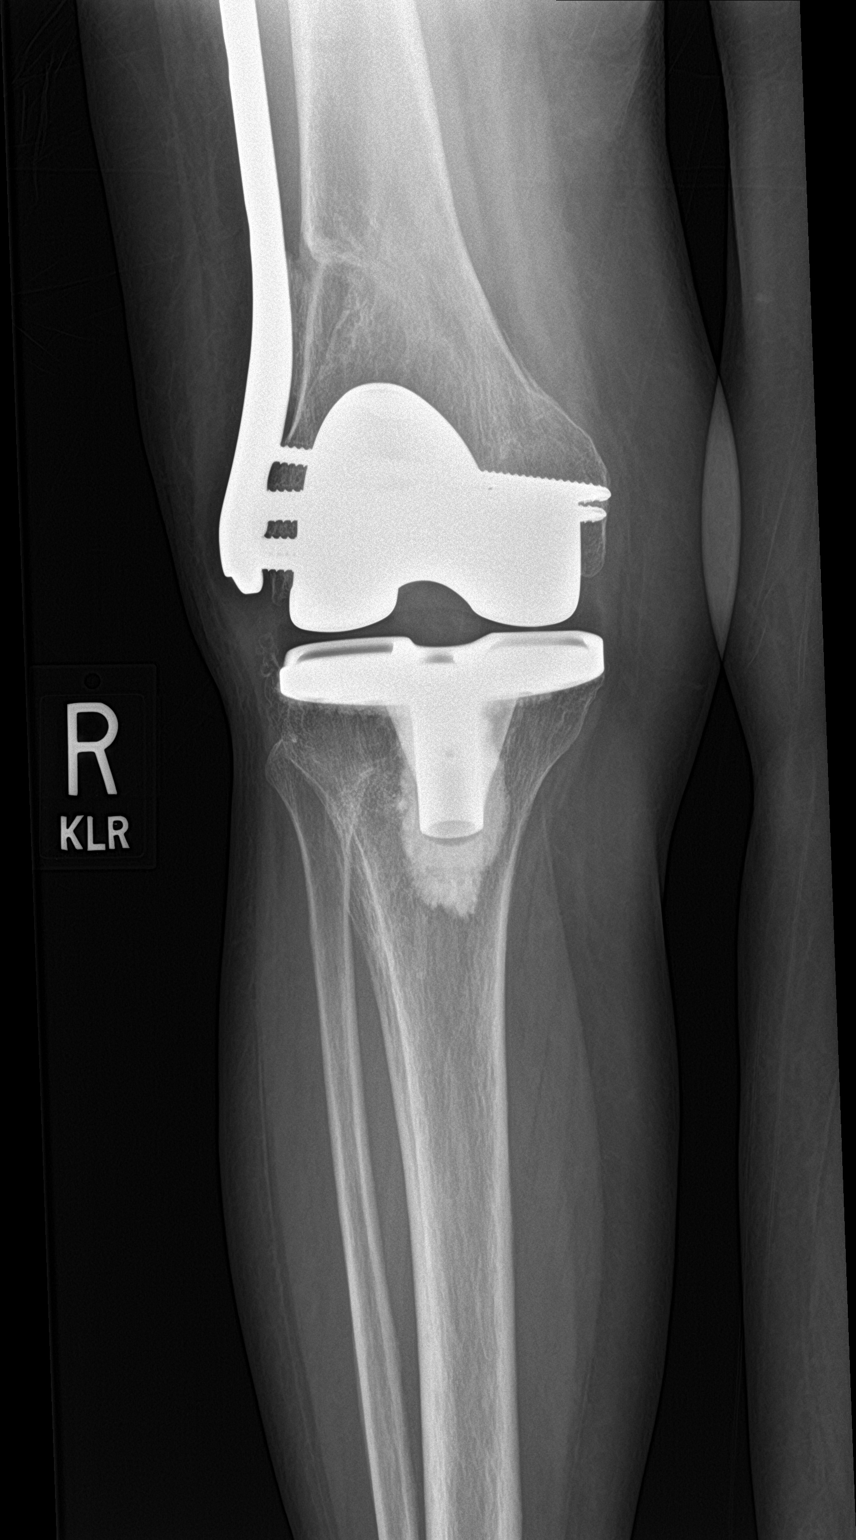

[tibia ap (2 of 2)]
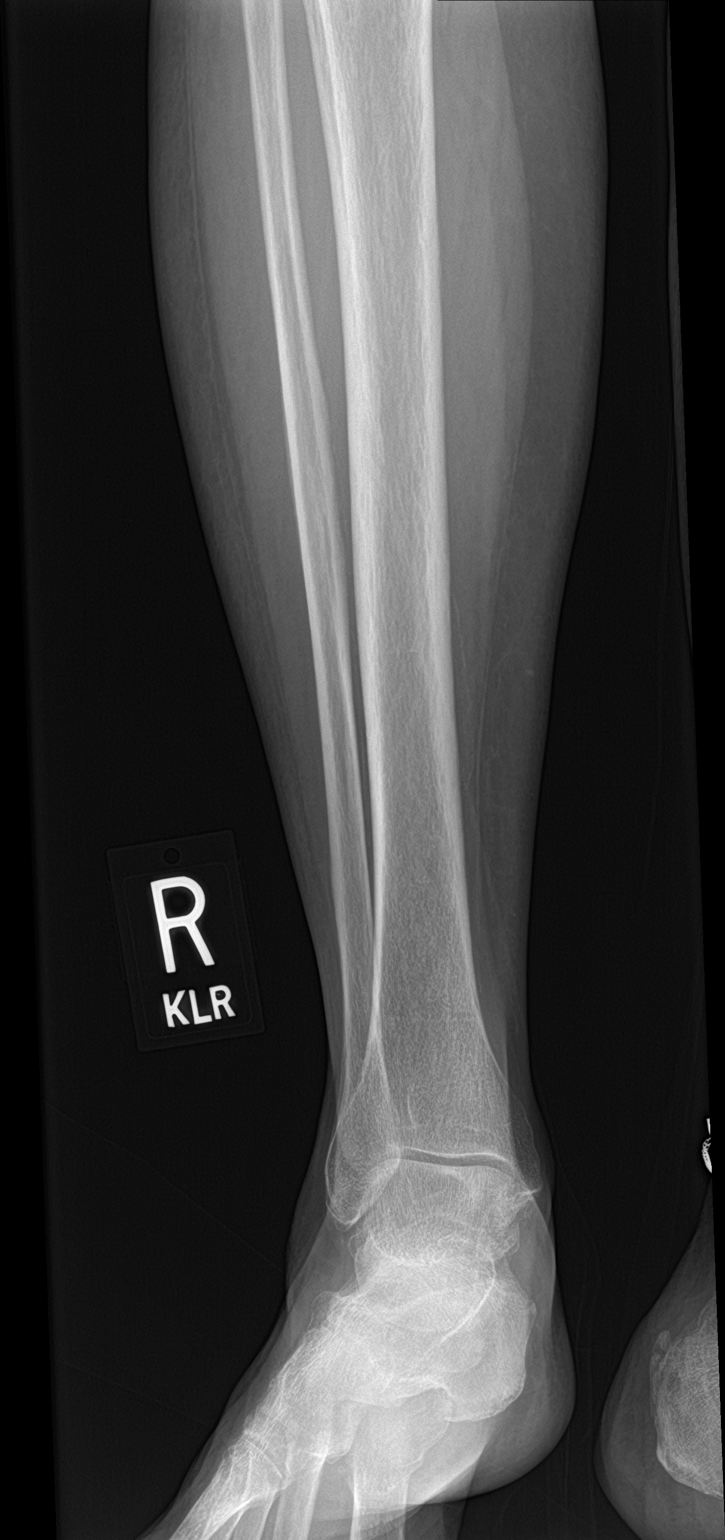

[tibia lat (1 of 2)]
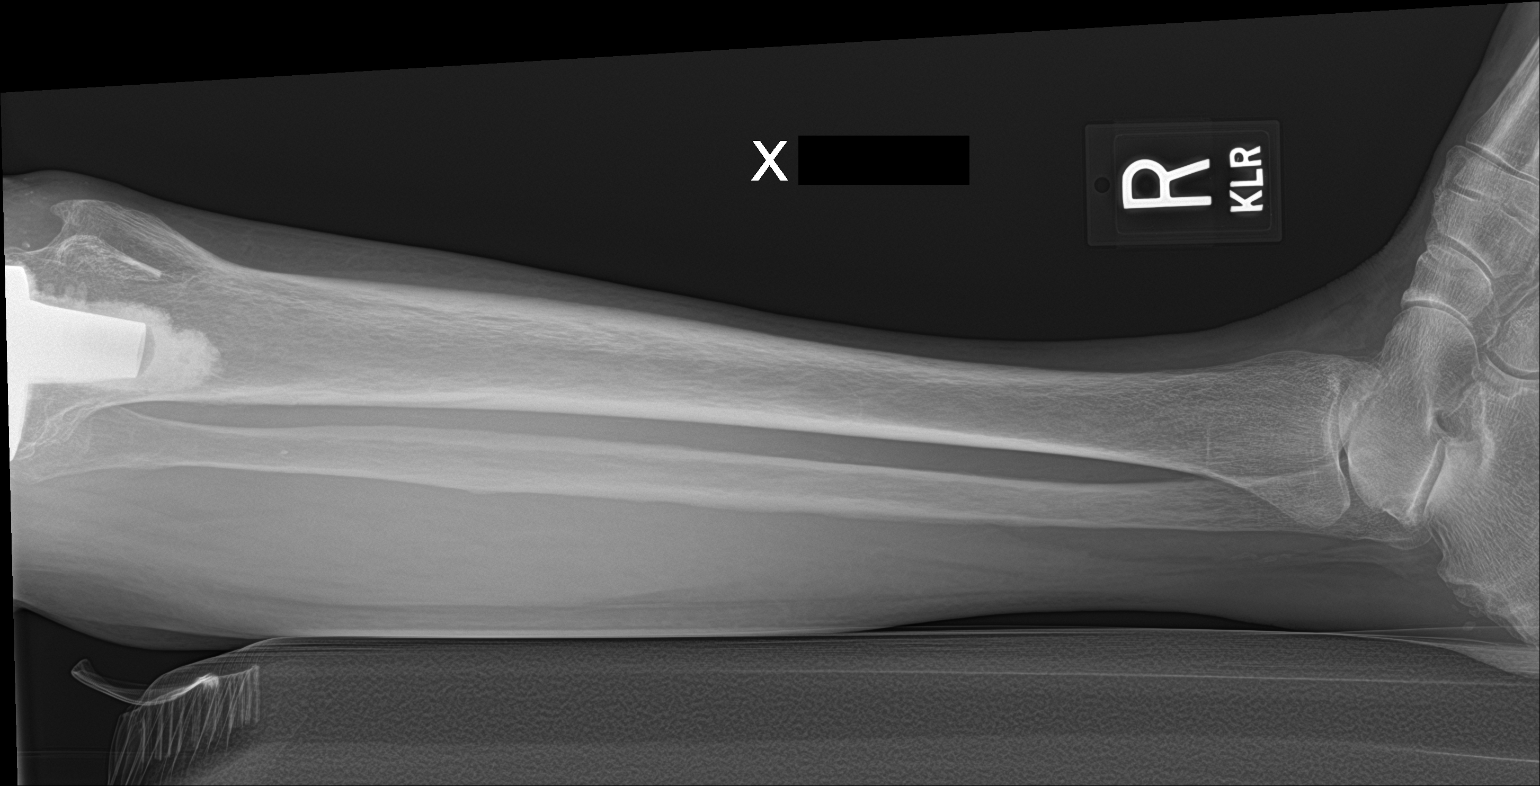

[tibia lat (2 of 2)]
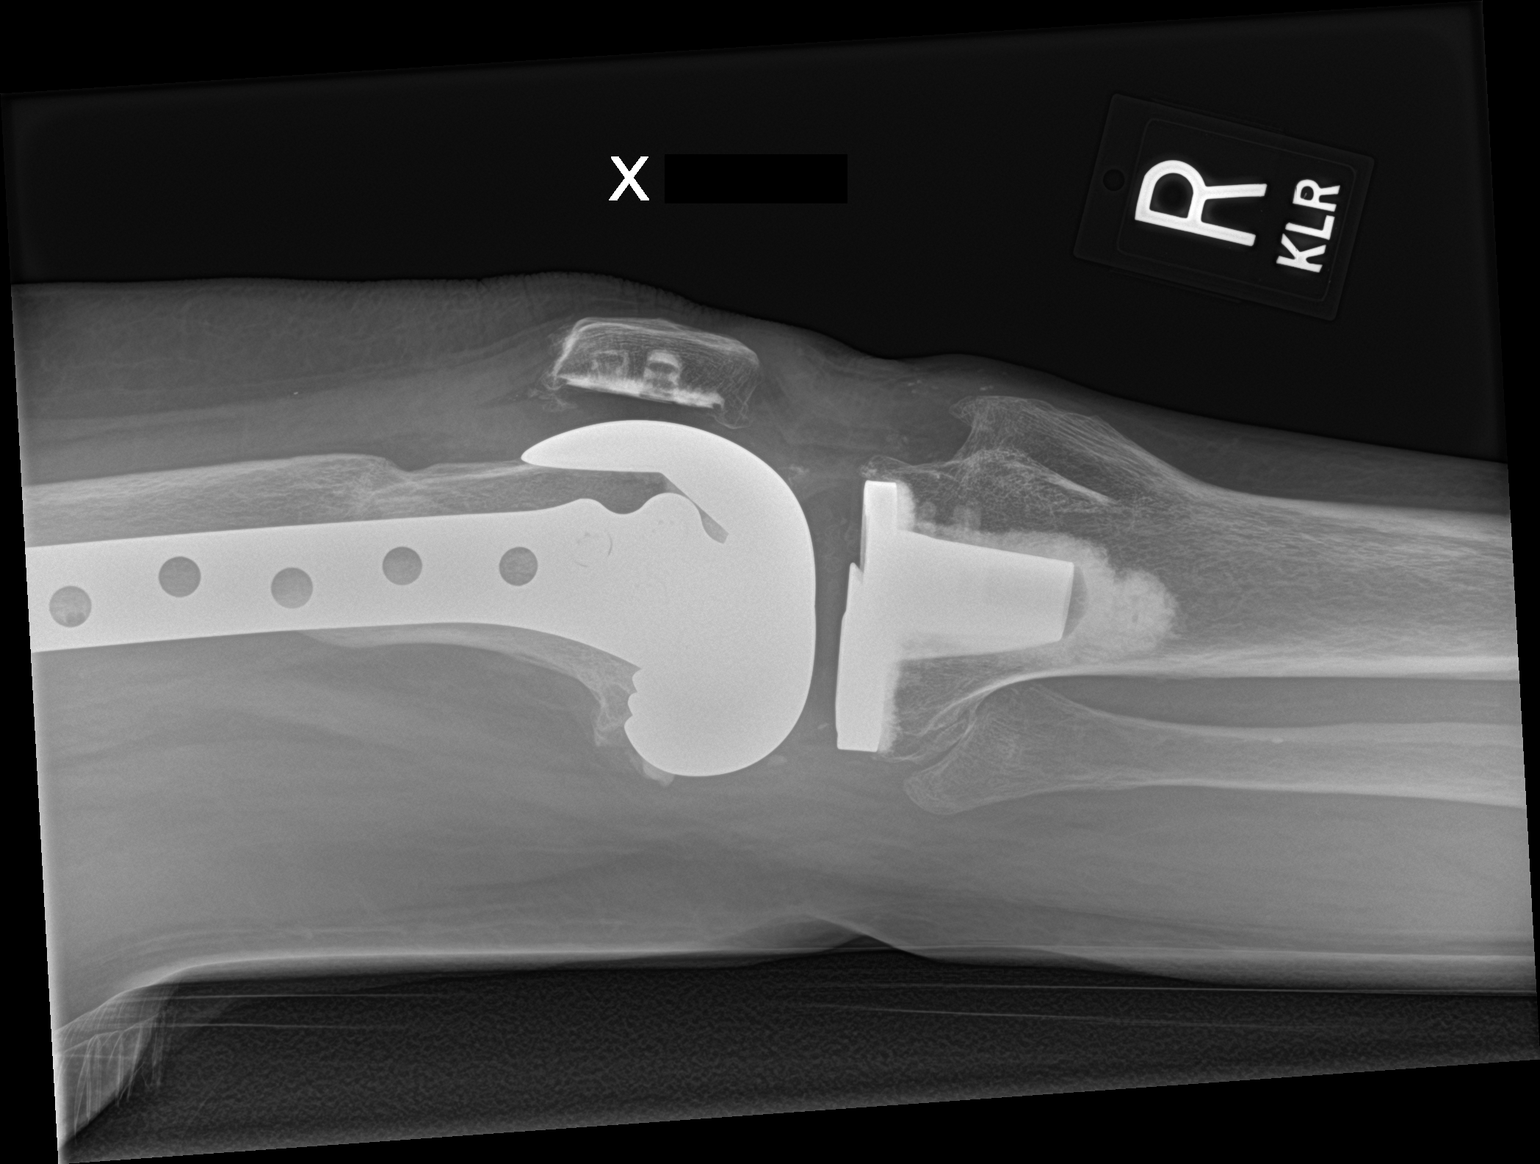

[4 of 4 positions shown; findings below may reference images not displayed]

FINDINGS: Postsurgical changes of the right knee with total knee arthroplasty
and lateral distal femoral fixation plate. No acute abnormality.
IMPRESSION: Right total knee arthroplasty.  No acute abnormality.

## 2020-12-11 MED ORDER — MELOXICAM 15 MG PO TABS: 15.0000 mg | ORAL_TABLET | Freq: Every day | ORAL | 0 refills | Status: DC

## 2020-12-11 MED ORDER — DICLOFENAC SODIUM 1 % EX GEL: 2.0000 g | Freq: Four times a day (QID) | CUTANEOUS | 1 refills | Status: DC

## 2020-12-11 MED ORDER — METHYLPREDNISOLONE 8 MG PO TABS: 8.0000 mg | ORAL_TABLET | Freq: Every day | ORAL | 0 refills | Status: AC

## 2020-12-11 NOTE — ED Notes (Signed)
Pt explains history of many surgeries due to different falls; reports history of bone density issue; fell on R knee today; can wiggle toes; dorsalis pedis pulse at R foot 2+; foot warm; knee deformed but not outside of baseline per pt since previous surgeries; reports intense pain at site of rod; abrasion at site; no obvious bleeding. Pt currently unwilling to attempt to bend knee or shift it in any way due to pain. Pt's resp reg/unlabored; skin dry; laying calmly in recliner; family member at bedside.

## 2020-12-11 NOTE — ED Triage Notes (Signed)
Pt comes via EMS from home with c/o right knee pain from fall today. Pt denies any LOC or hitting head.  Pt states she slipped over a dog pad. Pt took prescribed med of 5mg  oxy.  156/80 98% 64 HR

## 2020-12-11 NOTE — ED Provider Notes (Signed)
[facility] Emergency Department Provider Note  ____________________________________________  Time seen: Approximately 9:44 PM  I have reviewed the triage vital signs and the nursing notes.   HISTORY  Chief Complaint Fall    HPI Lisa Matthews is a 65 y.o. female who presents to the emergency department complaining of right leg pain.  Patient sustained a mechanical fall when she slipped on a puppy pad.  Patient fell onto the right side.  She has a history of extensive surgery to this extremity after another fall.  Patient had a knee replacement that had a fracture around that after a fall and had a rod placed from her hip to her knee.  Patient was concerned given the amount of pain that she could have sustained another fracture around this hardware.  She denied her head or lose consciousness.  She denies any complaints to the upper extremities or back.  Patient's primary complaint is right leg pain.       Past Medical History:  Diagnosis Date   Allergy    Anxiety    Arthritis    COPD (chronic obstructive pulmonary disease) ([place])    Depression    Diabetes mellitus without complication ([place])    Heart murmur    Hypertension    Kidney stones    Osteoporosis     Patient Active Problem List   Diagnosis Date Noted   Atypical chest pain 10/27/2020   Hyperlipidemia associated with type 2 diabetes mellitus ([place]) 10/27/2020   Anxiety 06/20/2020   At high risk for falls 06/20/2020   Chronic, continuous use of opioids 06/20/2020   Family history of osteoporosis 06/20/2020   Hx of stroke without residual deficits 06/20/2020   Personal history of kidney stones 06/20/2020   Postmenopausal osteoporosis 06/20/2020   Chronic constipation 10/14/2019   Left sided abdominal pain 10/13/2019   Memory loss 05/21/2017   Orthostatic hypotension 10/29/2016   Preop cardiovascular exam 10/29/2016   Kidney stones, calcium oxalate 02/02/2016   Advance directive discussed  with patient 12/23/2014   Type 2 diabetes mellitus ([place]) 09/30/2014   Abnormal Pap smear of cervix 09/23/2014   Primary osteoarthritis of knee 08/10/2014   Chronic pain syndrome 06/13/2014   Obesity (BMI 30-39.9) 06/13/2014   Pain in joint, lower leg 06/13/2014   Bariatric surgery status 06/13/2014   Vitamin D deficiency, unspecified 06/13/2014   Fibromyalgia 06/13/2014   Morbid obesity ([place]) 04/27/2012   Obstructive chronic bronchitis ([place]) 02/05/2012   Achilles tendinitis 12/30/2011   Acquired equinus deformity of foot 12/30/2011   Haglund's deformity 12/30/2011   Liver disorder 10/21/2011   Abnormal results of thyroid function studies 10/16/2011   Myopia 06/25/2011   Obstructive sleep apnea 10/23/2010   Chronic nonalcoholic liver disease 07/14/1733   Essential hypertension 08/22/2008   Low back pain 09/16/2007   Other depressive disorder 08/10/2007   Hyperlipidemia 07/20/2007   Type 2 diabetes mellitus, with long-term current use of insulin ([place]) 07/18/2007    Past Surgical History:  Procedure Laterality Date   CESAREAN SECTION     CYSTOSCOPY/URETEROSCOPY/HOLMIUM LASER/STENT PLACEMENT     FRACTURE SURGERY     JOINT REPLACEMENT Right    knee   TUBAL LIGATION      Prior to Admission medications   Medication Sig Start Date End Date Taking? Authorizing Provider  diclofenac Sodium (VOLTAREN) 1 % GEL Apply 2 g topically 4 (four) times daily. 12/11/20  Yes Alisea Matte, Charline Bills, PA-C  meloxicam (MOBIC) 15 MG tablet Take 1 tablet (15 mg  total) by mouth daily. 12/11/20  Yes Shell Blanchette, Charline Bills, PA-C  methylPREDNISolone (MEDROL) 8 MG tablet Take 1 tablet (8 mg total) by mouth daily for 5 days. 12/11/20 12/16/20 Yes Harles Evetts, Charline Bills, PA-C  ACCU-CHEK GUIDE test strip USE TO CHECK BLOOD SUGAR 4 TIMES DAILY 11/01/20   [provider]  acetaminophen (TYLENOL) 650 MG CR tablet Take by mouth as needed.    [provider]  albuterol (VENTOLIN HFA) 108 (90 Base) MCG/ACT  inhaler Inhale into the lungs. Patient not taking: Reported on 12/07/2020 11/16/20   [provider]  amLODipine (NORVASC) 2.5 MG tablet Take 1 tablet (2.5 mg total) by mouth daily. 10/05/20   Vigg, Avanti, MD  aspirin EC 81 MG tablet Take 1 tablet (81 mg total) by mouth daily. Swallow whole. 10/26/20   End, Harrell Gave, MD  atenolol (TENORMIN) 50 MG tablet Take 1 tablet (50 mg total) by mouth daily. 11/20/20   Charlynne Cousins, MD  Biotin w/ Vitamins C & E (HAIR/SKIN/NAILS PO) Take by mouth daily.    [provider]  Blood Glucose Monitoring Suppl (ACCU-CHEK GUIDE ME) w/Device KIT CHECK BLOOD SUGAR 4 TIMES DAILY 11/01/20   [provider]  Calcium Carbonate-Vitamin D 600-400 MG-UNIT tablet Take 1 tablet by mouth daily.    [provider]  Cholecalciferol 25 MCG (1000 UT) tablet Take 1,000 Units by mouth daily.    [provider]  cyanocobalamin 1000 MCG tablet Take 1,000 mcg by mouth daily.    [provider]  dapagliflozin propanediol (FARXIGA) 5 MG TABS tablet Take 1 tablet (5 mg total) by mouth daily before breakfast. 09/25/20   Vigg, Avanti, MD  ELDERBERRY PO Take by mouth daily.    [provider]  fenofibrate (TRICOR) 145 MG tablet Take 1 tablet (145 mg total) by mouth daily. 09/26/20   Vigg, Avanti, MD  gabapentin (NEURONTIN) 300 MG capsule Take 1 capsule (300 mg total) by mouth 2 (two) times daily. Patient taking differently: Take 300 mg by mouth 4 (four) times daily. 10/09/20   Vigg, Avanti, MD  ibuprofen (ADVIL) 600 MG tablet Take 600 mg by mouth 2 (two) times daily as needed. 09/03/20   [provider]  insulin glargine (LANTUS) 100 UNIT/ML injection Inject 20 Units into the skin daily. Patient not taking: Reported on 12/07/2020    [provider]  Lancets MISC Frequency:PRN   Dosage:0.0     Instructions:  Note:Dose: 1 Patient not taking: Reported on 12/07/2020 01/19/09   [provider]  linaclotide Rolan Lipa) 145 MCG  CAPS capsule Take 1 capsule (145 mcg total) by mouth daily before breakfast. 11/16/20   Virgel Manifold, MD  losartan (COZAAR) 25 MG tablet Take 1 tablet (25 mg total) by mouth daily. 09/25/20   Vigg, Avanti, MD  metFORMIN (GLUCOPHAGE) 500 MG tablet Take 1 tablet (500 mg total) by mouth 2 (two) times daily with a meal. 09/25/20   Vigg, Avanti, MD  mirtazapine (REMERON) 30 MG tablet Take 30 mg by mouth. 03/31/15   [provider]  omeprazole (PRILOSEC) 40 MG capsule Take 40 mg by mouth daily. 11/22/20   [provider]  oxyCODONE (OXY IR/ROXICODONE) 5 MG immediate release tablet Take 5 mg by mouth 3 (three) times daily as needed. 09/18/20   [provider]  pantoprazole (PROTONIX) 40 MG tablet Take 1 tablet by mouth daily. 05/29/20   [provider]  promethazine (PHENERGAN) 12.5 MG tablet Take 1 tablet (12.5 mg total) by mouth every 8 (  eight) hours as needed for nausea or vomiting. 11/01/20   McElwee, Scheryl Darter, NP  SUPREP BOWEL PREP KIT 17.5-3.13-1.6 GM/177ML SOLN Take by mouth. 11/16/20   [provider]  Teriparatide, Recombinant, 620 MCG/2.48ML SOPN Inject into the skin at bedtime. 07/12/20   [provider]  tiZANidine (ZANAFLEX) 4 MG tablet Take 1 tablet (4 mg total) by mouth at bedtime. Needs appt for further refills 11/30/20   Vigg, Avanti, MD  venlafaxine XR (EFFEXOR-XR) 150 MG 24 hr capsule Take 1 capsule (150 mg total) by mouth in the morning and at bedtime. 11/09/20   Jon Billings, NP  vitamin C (ASCORBIC ACID) 500 MG tablet Take 500 mg by mouth daily.    [provider]  zinc gluconate 50 MG tablet Take 50 mg by mouth daily.    [provider]    Allergies Sitagliptin, Atorvastatin, Bupropion, Nitrofurantoin macrocrystal, Other, Pravastatin, Ciprofloxacin, Doxycycline, Exenatide, Ezetimibe, Fluconazole, Morphine, Penicillins, Prednisone, Simvastatin, Sulfa antibiotics, and Trazodone  Family History  Problem Relation  Age of Onset   Heart attack Mother 54   Emphysema Father    Heart attack Father 14   Heart disease Father    Cancer Sister     Social History Social History   Tobacco Use   Smoking status: Never   Smokeless tobacco: Never  Vaping Use   Vaping Use: Every day  Substance Use Topics   Alcohol use: Never   Drug use: Never     Review of Systems  Constitutional: No fever/chills Eyes: No visual changes. No discharge ENT: No upper respiratory complaints. Cardiovascular: no chest pain. Respiratory: no cough. No SOB. Gastrointestinal: No abdominal pain.  No nausea, no vomiting.  No diarrhea.  No constipation. Musculoskeletal: Right leg injury Skin: Negative for rash, abrasions, lacerations, ecchymosis. Neurological: Negative for headaches, focal weakness or numbness.  10 System ROS otherwise negative.  ____________________________________________   PHYSICAL EXAM:  VITAL SIGNS: ED Triage Vitals  Enc Vitals Group     BP 12/11/20 1721 (!) 194/78     Pulse Rate 12/11/20 1721 (!) 59     Resp 12/11/20 1721 16     Temp 12/11/20 1721 97.9 F (36.6 C)     Temp Source 12/11/20 1721 Oral     SpO2 12/11/20 1721 95 %     Weight --      Height --      Head Circumference --      Peak Flow --      Pain Score 12/11/20 1711 10     Pain Loc --      Pain Edu? --      Excl. in [place]? --      Constitutional: Alert and oriented. Well appearing and in no acute distress. Eyes: Conjunctivae are normal. PERRL. EOMI. Head: Atraumatic. ENT:      Ears:       Nose: No congestion/rhinnorhea.      Mouth/Throat: Mucous membranes are moist.  Neck: No stridor.    Cardiovascular: Normal rate, regular rhythm. Normal S1 and S2.  Good peripheral circulation. Respiratory: Normal respiratory effort without tachypnea or retractions. Lungs CTAB. Good air entry to the bases with no decreased or absent breath sounds. Musculoskeletal: Full range of motion to all extremities. No gross deformities  appreciated.Visualization of the right leg reveals surgical scars from previous surgeries.  No open wounds.  No gross ecchymosis or deformity.  Limited range of motion due to pain. Neurologic:  Normal speech and language. No gross focal neurologic  deficits are appreciated.  Skin:  Skin is warm, dry and intact. No rash noted. Psychiatric: Mood and affect are normal. Speech and behavior are normal. Patient exhibits appropriate insight and judgement.   ____________________________________________   LABS (all labs ordered are listed, but only abnormal results are displayed)  Labs Reviewed - No data to display ____________________________________________  EKG   ____________________________________________  RADIOLOGY I personally viewed and evaluated these images as part of my medical decision making, as well as reviewing the written report by the radiologist.  ED Provider Interpretation: No evidence of fracture or loosening of the hardware on imaging.  No results found.  ____________________________________________    PROCEDURES  Procedure(s) performed:    Procedures    Medications - No data to display   ____________________________________________   INITIAL IMPRESSION / ASSESSMENT AND PLAN / ED COURSE  Pertinent labs & imaging results that were available during my care of the patient were reviewed by me and considered in my medical decision making (see chart for details).  Review of the [place] CSRS was performed in accordance of the [place] prior to dispensing any controlled drugs.           Patient's diagnosis is consistent with fall contusion on the right leg.  Patient presented to the emergency department complaining of leg pain after mechanical fall today.  Patient has a history of extensive surgical repair to the right leg and took a fall with increased pain to her leg.  She has chronic pain from this previous injury that has been increased after tonight's injury.   There was no visible signs of trauma to the external leg.  Imaging revealed no loosening of the hardware.  There was no evidence of fracture.  Patient will be placed on multiple symptom control medications.  She is currently on narcotic therapy for chronic pain and I will add anti-inflammatory, both oral and topical as well as short burst of steroids.  Patient will have follow-up instructions with orthopedics.  If any new or concerning signs or symptoms develop patient may always return to the emergency department. Patient is given ED precautions to return to the ED for any worsening or new symptoms.     ____________________________________________  FINAL CLINICAL IMPRESSION(S) / ED DIAGNOSES  Final diagnoses:  Fall, initial encounter  Contusion of right lower extremity, initial encounter      NEW MEDICATIONS STARTED DURING THIS VISIT:  ED Discharge Orders          Ordered    meloxicam (MOBIC) 15 MG tablet  Daily        12/11/20 2151    diclofenac Sodium (VOLTAREN) 1 % GEL  4 times daily        12/11/20 2151    methylPREDNISolone (MEDROL) 8 MG tablet  Daily        12/11/20 2151                This chart was dictated using voice recognition software/Dragon. Despite best efforts to proofread, errors can occur which can change the meaning. Any change was purely unintentional.    Darletta Moll, PA-C 12/11/20 2343    Naaman Plummer, MD 12/14/20 0700

## 2020-12-13 LAB — COMPLIANCE DRUG ANALYSIS, UR

## 2020-12-28 ENCOUNTER — Other Ambulatory Visit: Payer: Self-pay

## 2020-12-28 MED ORDER — IBUPROFEN 600 MG PO TABS: 600.0000 mg | ORAL_TABLET | Freq: Two times a day (BID) | ORAL | 1 refills | Status: DC | PRN

## 2021-01-01 DIAGNOSIS — E119 Type 2 diabetes mellitus without complications: Secondary | ICD-10-CM | POA: Diagnosis not present

## 2021-01-02 ENCOUNTER — Other Ambulatory Visit: Payer: Medicare Other

## 2021-01-02 ENCOUNTER — Other Ambulatory Visit: Payer: Self-pay

## 2021-01-02 DIAGNOSIS — M5416 Radiculopathy, lumbar region: Secondary | ICD-10-CM | POA: Diagnosis not present

## 2021-01-02 DIAGNOSIS — Z79899 Other long term (current) drug therapy: Secondary | ICD-10-CM | POA: Diagnosis not present

## 2021-01-02 DIAGNOSIS — M160 Bilateral primary osteoarthritis of hip: Secondary | ICD-10-CM | POA: Diagnosis not present

## 2021-01-02 DIAGNOSIS — Z79891 Long term (current) use of opiate analgesic: Secondary | ICD-10-CM | POA: Diagnosis not present

## 2021-01-02 DIAGNOSIS — E139 Other specified diabetes mellitus without complications: Secondary | ICD-10-CM | POA: Diagnosis not present

## 2021-01-02 LAB — BAYER DCA HB A1C WAIVED: HB A1C (BAYER DCA - WAIVED): 7.5 % — ABNORMAL HIGH (ref <7.0)

## 2021-01-03 LAB — COMPREHENSIVE METABOLIC PANEL
ALT: 14 IU/L (ref 0–32)
AST: 17 IU/L (ref 0–40)
Albumin/Globulin Ratio: 1.7 (ref 1.2–2.2)
Albumin: 4.3 g/dL (ref 3.8–4.8)
Alkaline Phosphatase: 91 IU/L (ref 44–121)
BUN/Creatinine Ratio: 24 (ref 12–28)
BUN: 18 mg/dL (ref 8–27)
Bilirubin Total: 0.3 mg/dL (ref 0.0–1.2)
CO2: 24 mmol/L (ref 20–29)
Calcium: 10.9 mg/dL — ABNORMAL HIGH (ref 8.7–10.3)
Chloride: 107 mmol/L — ABNORMAL HIGH (ref 96–106)
Creatinine, Ser: 0.75 mg/dL (ref 0.57–1.00)
Globulin, Total: 2.6 g/dL (ref 1.5–4.5)
Glucose: 149 mg/dL — ABNORMAL HIGH (ref 65–99)
Potassium: 4.7 mmol/L (ref 3.5–5.2)
Sodium: 146 mmol/L — ABNORMAL HIGH (ref 134–144)
Total Protein: 6.9 g/dL (ref 6.0–8.5)
eGFR: 89 mL/min/{1.73_m2} (ref >59)

## 2021-01-03 LAB — CBC WITH DIFFERENTIAL/PLATELET
Basophils Absolute: 0 10*3/uL (ref 0.0–0.2)
Basos: 1 %
EOS (ABSOLUTE): 0.1 10*3/uL (ref 0.0–0.4)
Eos: 2 %
Hematocrit: 42.4 % (ref 34.0–46.6)
Hemoglobin: 14 g/dL (ref 11.1–15.9)
Immature Grans (Abs): 0 10*3/uL (ref 0.0–0.1)
Immature Granulocytes: 1 %
Lymphocytes Absolute: 2.1 10*3/uL (ref 0.7–3.1)
Lymphs: 33 %
MCH: 29.7 pg (ref 26.6–33.0)
MCHC: 33 g/dL (ref 31.5–35.7)
MCV: 90 fL (ref 79–97)
Monocytes Absolute: 0.6 10*3/uL (ref 0.1–0.9)
Monocytes: 9 %
Neutrophils Absolute: 3.7 10*3/uL (ref 1.4–7.0)
Neutrophils: 54 %
Platelets: 199 10*3/uL (ref 150–450)
RBC: 4.72 x10E6/uL (ref 3.77–5.28)
RDW: 12.5 % (ref 11.7–15.4)
WBC: 6.5 10*3/uL (ref 3.4–10.8)

## 2021-01-03 LAB — THYROID PANEL WITH TSH
Free Thyroxine Index: 1.9 (ref 1.2–4.9)
T3 Uptake Ratio: 26 % (ref 24–39)
T4, Total: 7.3 ug/dL (ref 4.5–12.0)
TSH: 1.89 u[IU]/mL (ref 0.450–4.500)

## 2021-01-03 LAB — LIPID PANEL
Chol/HDL Ratio: 4.7 ratio — ABNORMAL HIGH (ref 0.0–4.4)
Cholesterol, Total: 221 mg/dL — ABNORMAL HIGH (ref 100–199)
HDL: 47 mg/dL (ref >39)
LDL Chol Calc (NIH): 132 mg/dL — ABNORMAL HIGH (ref 0–99)
Triglycerides: 237 mg/dL — ABNORMAL HIGH (ref 0–149)
VLDL Cholesterol Cal: 42 mg/dL — ABNORMAL HIGH (ref 5–40)

## 2021-01-09 ENCOUNTER — Other Ambulatory Visit: Payer: Self-pay

## 2021-01-09 ENCOUNTER — Ambulatory Visit (INDEPENDENT_AMBULATORY_CARE_PROVIDER_SITE_OTHER): Payer: Medicare Other | Admitting: Nurse Practitioner

## 2021-01-09 ENCOUNTER — Encounter: Payer: Self-pay | Admitting: Nurse Practitioner

## 2021-01-09 ENCOUNTER — Telehealth: Payer: Self-pay | Admitting: Internal Medicine

## 2021-01-09 VITALS — BP 131/82 | HR 54 | Temp 98.1°F | Ht 64.17 in | Wt 181.2 lb

## 2021-01-09 DIAGNOSIS — E785 Hyperlipidemia, unspecified: Secondary | ICD-10-CM | POA: Diagnosis not present

## 2021-01-09 DIAGNOSIS — E119 Type 2 diabetes mellitus without complications: Secondary | ICD-10-CM

## 2021-01-09 DIAGNOSIS — I10 Essential (primary) hypertension: Secondary | ICD-10-CM

## 2021-01-09 DIAGNOSIS — E1169 Type 2 diabetes mellitus with other specified complication: Secondary | ICD-10-CM | POA: Diagnosis not present

## 2021-01-09 DIAGNOSIS — Z9181 History of falling: Secondary | ICD-10-CM

## 2021-01-09 DIAGNOSIS — Z1211 Encounter for screening for malignant neoplasm of colon: Secondary | ICD-10-CM | POA: Diagnosis not present

## 2021-01-09 NOTE — Telephone Encounter (Signed)
Copied from [place] [id number]. Topic: Medicare AWV >> Jan 09, 2021  2:55 PM Lavonia Drafts wrote: Reason for CRM: Left message for patient to call back and schedule Medicare Annual Wellness Visit (AWV) to be done virtually or by telephone.  No hx of AWV eligible as of   Please schedule at anytime with [facility] Health Advisor.      37 Minutes appointment   Any questions, please call me at [phone]

## 2021-01-09 NOTE — Assessment & Plan Note (Signed)
Chronic, well controlled. BP 131/82 today. Continue current regimen. Labs reviewed and kidney and liver function normal. Follow-up in 3 months.

## 2021-01-09 NOTE — Assessment & Plan Note (Signed)
Chronic, ongoing. A1C has improved today to 7.5%. Continue metformin and farxiga along with diet and exercise. Follow-up in 3 months.

## 2021-01-09 NOTE — Telephone Encounter (Signed)
Message created in error

## 2021-01-09 NOTE — Assessment & Plan Note (Addendum)
Had fall 1 month ago, when she slipped in the house. She is still having some pain to her right hip, however she is following with pain management. She was seen in the ER. X-ray of right hip and knee negative for fracture. Will provide handicap tag for safety. Unable to walk long distances without assistance.

## 2021-01-09 NOTE — Progress Notes (Signed)
Established Patient Office Visit  Subjective:  Patient ID: Lisa Matthews, female    DOB: 04-Sep-1955  Age: 65 y.o. MRN: [id number]  CC:  Chief Complaint  Patient presents with   Anxiety   Hypertension   Diabetes   Depression   Hyperlipidemia   Review labwork   [place], went to ED on 7/11   Wants Referral    Want referral to [facility] doctor that is taking new patients now and not in 6 months.     HPI Lisa Matthews presents for follow up on hypertension, diabetes, and hypertension. She also notes that about 10 days ago, the vision in her right eye got blurry all of a sudden. She states it felt like there was a film over her eye. She endorses her eye aching and took tylenol which helped with the pain. She said that the symptoms went away completely on their own in 4 days. She has tried to set up an appointment with an eye doctor, however she could not get an appointment until 6-9 months from now.   1 month ago, she slipped on puppy pad, and fell. EMS took to ER. Still having pain in right hip from the fall. Going to pain management for pain control. She has been going to the pool 3x a week to help with exercise.   HYPERTENSION / HYPERLIPIDEMIA  Satisfied with current treatment? yes Duration of hypertension: chronic BP monitoring frequency: weekly BP range: 125/80 BP medication side effects: no Past BP meds: amlodipine and losartan (cozaar) Duration of hyperlipidemia: chronic Cholesterol medication side effects: no Cholesterol supplements: none Past cholesterol medications: fenofibrate (tricor) Medication compliance: excellent compliance Aspirin: yes Recent stressors: no Recurrent headaches: no Visual changes: yes Palpitations: no Dyspnea: no Chest pain: no Lower extremity edema: no Dizzy/lightheaded:  intermittent  DIABETES  Hypoglycemic episodes:no Polydipsia/polyuria: no Visual disturbance: yes Chest pain: no Paresthesias: no Glucose Monitoring:  yes  Accucheck frequency: Daily  Fasting glucose: 120  Post prandial:  Evening:  Before meals: Taking Insulin?: no  Long acting insulin:  Short acting insulin: Blood Pressure Monitoring: not checking Retinal Examination: Not up to Date Foot Exam: Not up to Date Diabetic Education: Completed Pneumovax: Up to Date Influenza: Not up to Date Aspirin: yes   Past Medical History:  Diagnosis Date   Allergy    Anxiety    Arthritis    COPD (chronic obstructive pulmonary disease) ([place])    Depression    Diabetes mellitus without complication (HCC)    Heart murmur    Hypertension    Kidney stones    Osteoporosis     Past Surgical History:  Procedure Laterality Date   CESAREAN SECTION     CYSTOSCOPY/URETEROSCOPY/HOLMIUM LASER/STENT PLACEMENT     FRACTURE SURGERY     JOINT REPLACEMENT Right    knee   TUBAL LIGATION      Family History  Problem Relation Age of Onset   Heart attack Mother 48   Emphysema Father    Heart attack Father 70   Heart disease Father    Cancer Sister     Social History   Socioeconomic History   Marital status: Widowed    Spouse name: Not on file   Number of children: Not on file   Years of education: Not on file   Highest education level: Not on file  Occupational History   Not on file  Tobacco Use   Smoking status: Never   Smokeless tobacco: Never  Vaping  Use   Vaping Use: Every day  Substance and Sexual Activity   Alcohol use: Never   Drug use: Never   Sexual activity: Yes    Birth control/protection: Surgical  Other Topics Concern   Not on file  Social History Narrative   Not on file   Social Determinants of Health   Financial Resource Strain: Not on file  Food Insecurity: Not on file  Transportation Needs: Not on file  Physical Activity: Not on file  Stress: Not on file  Social Connections: Not on file  Intimate Partner Violence: Not on file    Outpatient Medications Prior to Visit  Medication Sig Dispense Refill    ACCU-CHEK GUIDE test strip USE TO CHECK BLOOD SUGAR 4 TIMES DAILY     acetaminophen (TYLENOL) 650 MG CR tablet Take by mouth as needed.     albuterol (VENTOLIN HFA) 108 (90 Base) MCG/ACT inhaler Inhale into the lungs.     amLODipine (NORVASC) 2.5 MG tablet Take 1 tablet (2.5 mg total) by mouth daily. 30 tablet 3   aspirin EC 81 MG tablet Take 1 tablet (81 mg total) by mouth daily. Swallow whole. 90 tablet 3   atenolol (TENORMIN) 50 MG tablet Take 1 tablet (50 mg total) by mouth daily. 30 tablet 5   Biotin w/ Vitamins C & E (HAIR/SKIN/NAILS PO) Take by mouth daily.     Blood Glucose Monitoring Suppl (ACCU-CHEK GUIDE ME) w/Device KIT CHECK BLOOD SUGAR 4 TIMES DAILY     Calcium Carbonate-Vitamin D 600-400 MG-UNIT tablet Take 1 tablet by mouth daily.     Cholecalciferol 25 MCG (1000 UT) tablet Take 1,000 Units by mouth daily.     cyanocobalamin 1000 MCG tablet Take 1,000 mcg by mouth daily.     dapagliflozin propanediol (FARXIGA) 5 MG TABS tablet Take 1 tablet (5 mg total) by mouth daily before breakfast. 30 tablet 6   diclofenac Sodium (VOLTAREN) 1 % GEL Apply 2 g topically 4 (four) times daily. 100 g 1   ELDERBERRY PO Take by mouth daily.     fenofibrate (TRICOR) 145 MG tablet Take 1 tablet (145 mg total) by mouth daily. 30 tablet 6   gabapentin (NEURONTIN) 300 MG capsule Take 1 capsule (300 mg total) by mouth 2 (two) times daily. (Patient taking differently: Take 300 mg by mouth 4 (four) times daily.) 60 capsule 3   ibuprofen (ADVIL) 600 MG tablet Take 1 tablet (600 mg total) by mouth 2 (two) times daily as needed. 30 tablet 1   Lancets MISC      linaclotide (LINZESS) 145 MCG CAPS capsule Take 1 capsule (145 mcg total) by mouth daily before breakfast. 90 capsule 1   losartan (COZAAR) 25 MG tablet Take 1 tablet (25 mg total) by mouth daily. 30 tablet 3   meloxicam (MOBIC) 15 MG tablet Take 1 tablet (15 mg total) by mouth daily. 30 tablet 0   metFORMIN (GLUCOPHAGE) 500 MG tablet Take 1 tablet (500  mg total) by mouth 2 (two) times daily with a meal. 60 tablet 3   mirtazapine (REMERON) 30 MG tablet Take 30 mg by mouth.     omeprazole (PRILOSEC) 40 MG capsule Take 40 mg by mouth daily.     oxyCODONE (OXY IR/ROXICODONE) 5 MG immediate release tablet Take 5 mg by mouth 3 (three) times daily as needed.     pantoprazole (PROTONIX) 40 MG tablet Take 1 tablet by mouth daily.     promethazine (PHENERGAN) 12.5 MG tablet Take 1  tablet (12.5 mg total) by mouth every 8 (eight) hours as needed for nausea or vomiting. 20 tablet 0   SUPREP BOWEL PREP KIT 17.5-3.13-1.6 GM/177ML SOLN Take by mouth.     Teriparatide, Recombinant, 620 MCG/2.48ML SOPN Inject into the skin at bedtime.     tiZANidine (ZANAFLEX) 4 MG tablet Take 1 tablet (4 mg total) by mouth at bedtime. Needs appt for further refills 30 tablet 1   venlafaxine XR (EFFEXOR-XR) 150 MG 24 hr capsule Take 1 capsule (150 mg total) by mouth in the morning and at bedtime. 180 capsule 1   vitamin C (ASCORBIC ACID) 500 MG tablet Take 500 mg by mouth daily.     zinc gluconate 50 MG tablet Take 50 mg by mouth daily.     insulin glargine (LANTUS) 100 UNIT/ML injection Inject 20 Units into the skin daily. (Patient not taking: Reported on 01/09/2021)     No facility-administered medications prior to visit.    Allergies  Allergen Reactions   Sitagliptin Anaphylaxis   Atorvastatin Other (See Comments) and Rash    Pain Pain Pain Pain    Bupropion Other (See Comments)    "Make me delirious" "Make me delirious" "Make me delirious" "Make me delirious"    Nitrofurantoin Macrocrystal Rash   Other Hives   Pravastatin Other (See Comments)   Ciprofloxacin Other (See Comments)    Patient does not know Patient does not know    Doxycycline     headache   Exenatide Rash   Ezetimibe Rash   Fluconazole Rash   Morphine Rash   Penicillins Rash   Prednisone Rash   Simvastatin Rash   Sulfa Antibiotics Rash   Trazodone Rash    ROS Review of Systems   Constitutional:  Positive for fatigue.  Eyes:        Had blurry vision about 10 days ago that has resolved  Respiratory: Negative.    Cardiovascular: Negative.   Gastrointestinal: Negative.   Genitourinary: Negative.   Musculoskeletal:  Positive for arthralgias (right hip pain).  Skin: Negative.   Neurological:  Positive for dizziness (intermittent).     Objective:    Physical Exam Vitals and nursing note reviewed.  Constitutional:      General: She is not in acute distress.    Appearance: Normal appearance.  HENT:     Head: Normocephalic and atraumatic.  Eyes:     Conjunctiva/sclera: Conjunctivae normal.  Cardiovascular:     Rate and Rhythm: Normal rate and regular rhythm.     Pulses: Normal pulses.     Heart sounds: Normal heart sounds.  Pulmonary:     Effort: Pulmonary effort is normal.     Breath sounds: Normal breath sounds.  Abdominal:     Palpations: Abdomen is soft.     Tenderness: There is no abdominal tenderness.  Musculoskeletal:     Cervical back: Normal range of motion.     Right lower leg: No edema.     Left lower leg: No edema.  Skin:    General: Skin is warm and dry.  Neurological:     General: No focal deficit present.     Mental Status: She is alert and oriented to person, place, and time.  Psychiatric:        Mood and Affect: Mood normal.        Behavior: Behavior normal.        Thought Content: Thought content normal.        Judgment: Judgment normal.  BP 131/82   Pulse (!) 54   Temp 98.1 F (36.7 C) (Oral)   Ht 5' 4.17" (1.63 m)   Wt 181 lb 3.2 oz (82.2 kg)   SpO2 98%   BMI 30.94 kg/m  Wt Readings from Last 3 Encounters:  01/09/21 181 lb 3.2 oz (82.2 kg)  12/07/20 175 lb (79.4 kg)  11/22/20 177 lb 12.8 oz (80.6 kg)     Health Maintenance Due  Topic Date Due   INFLUENZA VACCINE  01/01/2021    There are no preventive care reminders to display for this patient.  Lab Results  Component Value Date   TSH 1.890 01/02/2021    Lab Results  Component Value Date   WBC 6.5 01/02/2021   HGB 14.0 01/02/2021   HCT 42.4 01/02/2021   MCV 90 01/02/2021   PLT 199 01/02/2021   Lab Results  Component Value Date   NA 146 (H) 01/02/2021   K 4.7 01/02/2021   CO2 24 01/02/2021   GLUCOSE 149 (H) 01/02/2021   BUN 18 01/02/2021   CREATININE 0.75 01/02/2021   BILITOT 0.3 01/02/2021   ALKPHOS 91 01/02/2021   AST 17 01/02/2021   ALT 14 01/02/2021   PROT 6.9 01/02/2021   ALBUMIN 4.3 01/02/2021   CALCIUM 10.9 (H) 01/02/2021   EGFR 89 01/02/2021   Lab Results  Component Value Date   CHOL 221 (H) 01/02/2021   Lab Results  Component Value Date   HDL 47 01/02/2021   Lab Results  Component Value Date   LDLCALC 132 (H) 01/02/2021   Lab Results  Component Value Date   TRIG 237 (H) 01/02/2021   Lab Results  Component Value Date   CHOLHDL 4.7 (H) 01/02/2021   Lab Results  Component Value Date   HGBA1C 7.5 (H) 01/02/2021      Assessment & Plan:   Problem List Items Addressed This Visit       Cardiovascular and Mediastinum   Essential hypertension    Chronic, well controlled. BP 131/82 today. Continue current regimen. Labs reviewed and kidney and liver function normal. Follow-up in 3 months.          Endocrine   Type 2 diabetes mellitus (HCC) - Primary    Chronic, ongoing. A1C has improved today to 7.5%. Continue metformin and farxiga along with diet and exercise. Follow-up in 3 months.        Hyperlipidemia associated with type 2 diabetes mellitus (HCC)    Chronic, ongoing. Total cholesterol and LDL have improved with the addition of fenofibrate. She is unable to tolerate statins. Continue aspirin and fenofibrate.          Other   At high risk for falls    Had fall 1 month ago, when she slipped in the house. She is still having some pain to her right hip, however she is following with pain management. She was seen in the ER. X-ray of right hip and knee negative for fracture. Will provide  handicap tag for safety. Unable to walk long distances without assistance.        Other Visit Diagnoses     Colon cancer screening       cologuard ordered today   Relevant Orders   Cologuard       No orders of the defined types were placed in this encounter.   Follow-up: Return in about 3 months (around 04/11/2021) for with lab visit 1 week before, f/u diabetes.    Charyl Dancer, NP

## 2021-01-09 NOTE — Assessment & Plan Note (Signed)
Chronic, ongoing. Total cholesterol and LDL have improved with the addition of fenofibrate. She is unable to tolerate statins. Continue aspirin and fenofibrate.

## 2021-01-16 ENCOUNTER — Telehealth: Payer: Self-pay

## 2021-01-16 DIAGNOSIS — Z1211 Encounter for screening for malignant neoplasm of colon: Secondary | ICD-10-CM | POA: Diagnosis not present

## 2021-01-16 NOTE — Telephone Encounter (Signed)
Copied from [place] [id number]. Topic: General - Other >> Jan 16, 2021 12:54 PM Pawlus, Brayton Layman A wrote: Reason for CRM: Pt was following up on her handicap sticker for her car, pt wanted a call back when it was ready to collect.

## 2021-01-16 NOTE — Telephone Encounter (Signed)
Patient left without at her last visit, is in the complete bin at front desk

## 2021-01-16 NOTE — Telephone Encounter (Signed)
Patient notified

## 2021-01-19 ENCOUNTER — Other Ambulatory Visit: Payer: Self-pay | Admitting: Internal Medicine

## 2021-01-19 LAB — HM DIABETES EYE EXAM

## 2021-01-19 NOTE — Telephone Encounter (Signed)
Patient last seen 01/09/21 and has appointment 04/17/21

## 2021-01-19 NOTE — Telephone Encounter (Signed)
Requested medication (s) are due for refill today: no  Requested medication (s) are on the active medication list: yes   Last refill:  12/25/2020  Future visit scheduled: yes  Notes to clinic:  this refill cannot be delegated    Requested Prescriptions  Pending Prescriptions Disp Refills   tiZANidine (ZANAFLEX) 4 MG tablet [Pharmacy Med Name: tiZANidine HCl 4 MG Oral Tablet] 30 tablet 0    Sig: TAKE 1 TABLET BY MOUTH AT BEDTIME . APPOINTMENT REQUIRED FOR FUTURE REFILLS     Not Delegated - Cardiovascular:  Alpha-2 Agonists - tizanidine Failed - 01/19/2021 10:03 AM      Failed - This refill cannot be delegated      Passed - Valid encounter within last 6 months    Recent Outpatient Visits           1 week ago Type 2 diabetes mellitus without complication, without long-term current use of insulin ([place])   [place], Lauren A, NP   1 month ago Painful urination   [place] Vigg, Avanti, MD   2 months ago [place], Lauren A, NP   3 months ago Painful urination   [place] Vigg, Avanti, MD   3 months ago Diabetes mellitus of other type without complication, unspecified whether long term insulin use ([place])   [place], Avanti, MD       Future Appointments             In 3 weeks End, Harrell Gave, MD [place], [place]   In 1 month Tahiliani, Lennette Bihari, MD [place]   In 2 months Vigg, Avanti, MD [employer], [facility]            Signed Prescriptions Disp Refills   losartan (COZAAR) 25 MG tablet 90 tablet 0    Sig: Take 1 tablet by mouth once daily     Cardiovascular:  Angiotensin Receptor Blockers Passed - 01/19/2021 10:03 AM      Passed - Cr in normal range and within 180 days    Creatinine, Ser  Date Value Ref Range Status  01/02/2021 0.75 0.57 - 1.00 mg/dL Final          Passed - K in normal range and within 180 days     Potassium  Date Value Ref Range Status  01/02/2021 4.7 3.5 - 5.2 mmol/L Final          Passed - Patient is not pregnant      Passed - Last BP in normal range    BP Readings from Last 1 Encounters:  01/09/21 131/82          Passed - Valid encounter within last 6 months    Recent Outpatient Visits           1 week ago Type 2 diabetes mellitus without complication, without long-term current use of insulin ([place])   [place], Lauren A, NP   1 month ago Painful urination   [facility] Vigg, Avanti, MD   2 months ago [place], Lauren A, NP   3 months ago Painful urination   [facility] Vigg, Avanti, MD   3 months ago Diabetes mellitus of other type without complication, unspecified whether long term insulin use ([place])   [place], Avanti, MD       Future Appointments  In 3 weeks End, Harrell Gave, MD [facility], [facility]   In 1 month Tahiliani, Lennette Bihari, MD [place]   In 2 months Vigg, Avanti, MD [facility], [facility]

## 2021-01-23 DIAGNOSIS — E119 Type 2 diabetes mellitus without complications: Secondary | ICD-10-CM | POA: Diagnosis not present

## 2021-01-23 DIAGNOSIS — H25093 Other age-related incipient cataract, bilateral: Secondary | ICD-10-CM | POA: Diagnosis not present

## 2021-01-23 DIAGNOSIS — R519 Headache, unspecified: Secondary | ICD-10-CM | POA: Diagnosis not present

## 2021-01-23 DIAGNOSIS — Z8673 Personal history of transient ischemic attack (TIA), and cerebral infarction without residual deficits: Secondary | ICD-10-CM | POA: Diagnosis not present

## 2021-01-23 DIAGNOSIS — H9313 Tinnitus, bilateral: Secondary | ICD-10-CM | POA: Diagnosis not present

## 2021-01-23 DIAGNOSIS — H353131 Nonexudative age-related macular degeneration, bilateral, early dry stage: Secondary | ICD-10-CM | POA: Diagnosis not present

## 2021-01-23 DIAGNOSIS — R42 Dizziness and giddiness: Secondary | ICD-10-CM | POA: Diagnosis not present

## 2021-01-24 LAB — COLOGUARD: Cologuard: NEGATIVE

## 2021-01-29 DIAGNOSIS — R42 Dizziness and giddiness: Secondary | ICD-10-CM | POA: Diagnosis not present

## 2021-02-01 ENCOUNTER — Other Ambulatory Visit: Payer: Self-pay | Admitting: Internal Medicine

## 2021-02-01 NOTE — Telephone Encounter (Signed)
Requested medication (s) are due for refill today: historical med  Requested medication (s) are on the active medication list: yes  Last refill:  11/22/20  Future visit scheduled: yes in 2 months  Notes to clinic:  historical medication, do you want to order Rx?     Requested Prescriptions  Pending Prescriptions Disp Refills   omeprazole (PRILOSEC) 40 MG capsule [Pharmacy Med Name: Omeprazole 40 MG Oral Capsule Delayed Release] 30 capsule 0    Sig: Take 1 capsule by mouth once daily     Gastroenterology: Proton Pump Inhibitors Passed - 02/01/2021  4:16 PM      Passed - Valid encounter within last 12 months    Recent Outpatient Visits           3 weeks ago Type 2 diabetes mellitus without complication, without long-term current use of insulin ([place])   [place], Lauren A, NP   2 months ago Painful urination   [facility] Vigg, Avanti, MD   3 months ago [place], Lauren A, NP   3 months ago Painful urination   [place] Vigg, Avanti, MD   3 months ago Diabetes mellitus of other type without complication, unspecified whether long term insulin use ([place])   [place], Avanti, MD       Future Appointments             In 1 week End, Harrell Gave, MD [facility], [facility]   In 2 weeks Tahiliani, Lennette Bihari, MD [place]   In 2 months Vigg, Avanti, MD [employer], [facility]            Signed Prescriptions Disp Refills   amLODipine (NORVASC) 2.5 MG tablet 30 tablet 0    Sig: Take 1 tablet by mouth once daily     Cardiovascular:  Calcium Channel Blockers Passed - 02/01/2021  4:16 PM      Passed - Last BP in normal range    BP Readings from Last 1 Encounters:  01/09/21 131/82          Passed - Valid encounter within last 6 months    Recent Outpatient Visits           3 weeks ago Type 2 diabetes mellitus without complication, without  long-term current use of insulin ([place])   [place], Lauren A, NP   2 months ago Painful urination   [facility] Vigg, Avanti, MD   3 months ago [place], Lauren A, NP   3 months ago Painful urination   [place] Vigg, Avanti, MD   3 months ago Diabetes mellitus of other type without complication, unspecified whether long term insulin use ([place])   [place], Avanti, MD       Future Appointments             In 1 week End, Harrell Gave, MD [facility], [facility]   In 2 weeks Virgel Manifold, MD [place]   In 2 months Vigg, Avanti, MD [facility], [facility]

## 2021-02-01 NOTE — Telephone Encounter (Signed)
Valid encounter . Future visit in 2 months

## 2021-02-02 NOTE — Telephone Encounter (Signed)
Patient last seen 01/09/21 and has appointment in November

## 2021-02-02 NOTE — Telephone Encounter (Signed)
Patient only takes omeprazole.

## 2021-02-05 DIAGNOSIS — N3000 Acute cystitis without hematuria: Secondary | ICD-10-CM | POA: Diagnosis not present

## 2021-02-06 DIAGNOSIS — M79604 Pain in right leg: Secondary | ICD-10-CM | POA: Diagnosis not present

## 2021-02-06 DIAGNOSIS — M545 Low back pain, unspecified: Secondary | ICD-10-CM | POA: Diagnosis not present

## 2021-02-06 DIAGNOSIS — Z79891 Long term (current) use of opiate analgesic: Secondary | ICD-10-CM | POA: Diagnosis not present

## 2021-02-06 DIAGNOSIS — G894 Chronic pain syndrome: Secondary | ICD-10-CM | POA: Diagnosis not present

## 2021-02-07 NOTE — Progress Notes (Signed)
02/08/21 9:45 AM   Lisa Matthews 07/10/1955 [id number]  Referring provider:  Charlynne Cousins, MD [address] [place],  [place] [address] Chief Complaint  Patient presents with   Nephrolithiasis     HPI: Lisa Matthews is a 65 y.o.female with a personal history of nephrolithiasis , who presents today for further valuation of back pain and burning with urination.   She has a history of recurrent UTIs, recurrent bacterial cystitis, and atrophic vaginitis.   Her most recent ct showed a 9 mm stone in the right lower pole.  She is undergone ureteroscopy in the past.  She was seen in urgent care 3 days ago at the time her urine was positive and she was treated with cipro.    Her urine today is frankly positive with many leukocytes, nitrite positive, many bacteria, and oxalate crystals.    She reports today that her pain is in her lower back.  She does not think the Cipro is helped at all. She states she is experiencing pain during urination and after. She denies any fevers. She reports she has had multiple UTIs this year that have been treated by Dr. Charlynne Cousins.   She has not been using estrogen cream in the recent past.  She is newly sexually active.  PMH: Past Medical History:  Diagnosis Date   Allergy    Anxiety    Arthritis    COPD (chronic obstructive pulmonary disease) ([place])    Depression    Diabetes mellitus without complication (HCC)    Heart murmur    Hypertension    Kidney stones    Osteoporosis     Surgical History: Past Surgical History:  Procedure Laterality Date   CESAREAN SECTION     CYSTOSCOPY/URETEROSCOPY/HOLMIUM LASER/STENT PLACEMENT     FRACTURE SURGERY     JOINT REPLACEMENT Right    knee   TUBAL LIGATION      Home Medications:  Allergies as of 02/08/2021       Reactions   Sitagliptin Anaphylaxis   Atorvastatin Other (See Comments), Rash   Pain Pain Pain Pain   Bupropion Other (See Comments)   "Make me delirious" "Make me delirious" "Make me  delirious" "Make me delirious"   Nitrofurantoin Macrocrystal Rash   Nsaids Hives, Rash   Other Hives   Pravastatin Other (See Comments)   Ciprofloxacin Other (See Comments)   Patient does not know Patient does not know   Doxycycline    headache   Exenatide Rash   Ezetimibe Rash   Fluconazole Rash   Morphine Rash   Oxybutynin Nausea And Vomiting   Penicillins Rash   Prednisone Rash   Simvastatin Rash   Sulfa Antibiotics Rash   Trazodone Rash   Wound Dressing Adhesive Rash        Medication List        Accurate as of February 08, 2021  9:45 AM. If you have any questions, ask your nurse or doctor.          STOP taking these medications    albuterol 108 (90 Base) MCG/ACT inhaler Commonly known as: VENTOLIN HFA Stopped by: Hollice Espy, MD   Suprep Bowel Prep Kit 17.5-3.13-1.6 GM/177ML Soln Generic drug: Na Sulfate-K Sulfate-Mg Sulf Stopped by: Hollice Espy, MD       TAKE these medications    Accu-Chek Guide Me w/Device Kit CHECK BLOOD SUGAR 4 TIMES DAILY   Accu-Chek Guide test strip Generic drug: glucose blood USE TO CHECK BLOOD SUGAR 4 TIMES  DAILY   acetaminophen 650 MG CR tablet Commonly known as: TYLENOL Take by mouth as needed.   amLODipine 2.5 MG tablet Commonly known as: NORVASC Take 1 tablet by mouth once daily   aspirin EC 81 MG tablet Take 1 tablet (81 mg total) by mouth daily. Swallow whole.   atenolol 100 MG tablet Commonly known as: TENORMIN Take by mouth. What changed: Another medication with the same name was removed. Continue taking this medication, and follow the directions you see here. Changed by: Hollice Espy, MD   Calcium Carbonate-Vitamin D 600-400 MG-UNIT tablet Take 1 tablet by mouth daily.   Cholecalciferol 25 MCG (1000 UT) tablet Take 1,000 Units by mouth daily.   ciprofloxacin 500 MG tablet Commonly known as: CIPRO Take 500 mg by mouth 2 (two) times daily.   cyanocobalamin 1000 MCG tablet Take 1,000 mcg  by mouth daily.   dapagliflozin propanediol 5 MG Tabs tablet Commonly known as: Farxiga Take 1 tablet (5 mg total) by mouth daily before breakfast.   diclofenac Sodium 1 % Gel Commonly known as: Voltaren Apply 2 g topically 4 (four) times daily.   ELDERBERRY PO Take by mouth daily.   fenofibrate 145 MG tablet Commonly known as: Tricor Take 1 tablet (145 mg total) by mouth daily.   gabapentin 300 MG capsule Commonly known as: NEURONTIN Take 1 capsule (300 mg total) by mouth 2 (two) times daily. What changed: when to take this   HAIR/SKIN/NAILS PO Take by mouth daily.   ibuprofen 600 MG tablet Commonly known as: ADVIL Take 1 tablet (600 mg total) by mouth 2 (two) times daily as needed.   insulin glargine 100 UNIT/ML injection Commonly known as: LANTUS Inject 20 Units into the skin daily.   Lancets Misc   linaclotide 145 MCG Caps capsule Commonly known as: Linzess Take 1 capsule (145 mcg total) by mouth daily before breakfast.   losartan 25 MG tablet Commonly known as: COZAAR Take 1 tablet by mouth once daily   meloxicam 15 MG tablet Commonly known as: MOBIC Take 1 tablet (15 mg total) by mouth daily.   metFORMIN 500 MG tablet Commonly known as: GLUCOPHAGE Take 1 tablet (500 mg total) by mouth 2 (two) times daily with a meal.   mirtazapine 30 MG tablet Commonly known as: REMERON Take 30 mg by mouth.   omeprazole 40 MG capsule Commonly known as: PRILOSEC Take 1 capsule by mouth once daily   oxyCODONE 5 MG immediate release tablet Commonly known as: Oxy IR/ROXICODONE Take 5 mg by mouth 3 (three) times daily as needed.   promethazine 12.5 MG tablet Commonly known as: PHENERGAN Take 1 tablet (12.5 mg total) by mouth every 8 (eight) hours as needed for nausea or vomiting.   Teriparatide (Recombinant) 620 MCG/2.48ML Sopn Inject into the skin at bedtime.   tiZANidine 4 MG tablet Commonly known as: ZANAFLEX TAKE 1 TABLET BY MOUTH AT BEDTIME . APPOINTMENT  REQUIRED FOR FUTURE REFILLS   venlafaxine XR 150 MG 24 hr capsule Commonly known as: EFFEXOR-XR Take 1 capsule (150 mg total) by mouth in the morning and at bedtime.   vitamin C 500 MG tablet Commonly known as: ASCORBIC ACID Take 500 mg by mouth daily.   zinc gluconate 50 MG tablet Take 50 mg by mouth daily.        Allergies:  Allergies  Allergen Reactions   Sitagliptin Anaphylaxis   Atorvastatin Other (See Comments) and Rash    Pain Pain Pain Pain    Bupropion  Other (See Comments)    "Make me delirious" "Make me delirious" "Make me delirious" "Make me delirious"    Nitrofurantoin Macrocrystal Rash   Nsaids Hives and Rash   Other Hives   Pravastatin Other (See Comments)   Ciprofloxacin Other (See Comments)    Patient does not know Patient does not know    Doxycycline     headache   Exenatide Rash   Ezetimibe Rash   Fluconazole Rash   Morphine Rash   Oxybutynin Nausea And Vomiting   Penicillins Rash   Prednisone Rash   Simvastatin Rash   Sulfa Antibiotics Rash   Trazodone Rash   Wound Dressing Adhesive Rash    Family History: Family History  Problem Relation Age of Onset   Heart attack Mother 78   Emphysema Father    Heart attack Father 83   Heart disease Father    Cancer Sister     Social History:  reports that she has never smoked. She has never used smokeless tobacco. She reports that she does not drink alcohol and does not use drugs.   Physical Exam: Vitals:   02/08/21 0935  BP: (!) 149/74  Pulse: 64   Constitutional:  Alert and oriented, No acute distress. HEENT: [place] AT, moist mucus membranes.  Trachea midline, no masses. Cardiovascular: No clubbing, cyanosis, or edema. Respiratory: Normal respiratory effort, no increased work of breathing. Skin: No rashes, bruises or suspicious lesions. Neurologic: Grossly intact, no focal deficits, moving all 4 extremities. Psychiatric: Normal mood and affect.  Laboratory Data:  Lab Results   Component Value Date   CREATININE 0.75 01/02/2021     Lab Results  Component Value Date   HGBA1C 7.5 (H) 01/02/2021    Urinalysis frankly positive with many leukocytes, nitrite positive, many bacteria, and oxalate crystals.  Pertinent Imaging: CT scans from [facility] reviewed, nonobstructing 9 mm right lower pole stone  Assessment & Plan:    Acute cystitis  - will send a culture today and prescribe antibiotics  -Urine remains frankly positive today suspicious for ongoing likely untreated infection, suspect probable resistance - Change antibiotic to oral Augmentin, called next care today to try to get information about whether or not a urine culture was sent however unable to reach anybody -She was given a dose of IM ceftriaxone today  2. Right kidney stone  - KUB to ensure stone has not migrated   3. Recurrent UTI   - Restart topical estrogen cream, pea-sized amount per urethral meatus - Counseled her on UTI prevention such as daily cranberry tablets, probiotics(look for lactobacillus), and d- mannose .     I,Kailey Littlejohn,acting as a Education administrator for Hollice Espy, MD.,have documented all relevant documentation on the behalf of Hollice Espy, MD,as directed by  Hollice Espy, MD while in the presence of Hollice Espy, MD.  I have reviewed the above documentation for accuracy and completeness, and I agree with the above.   Hollice Espy, MD   [facility] [address] [place],  [address] [phone]

## 2021-02-08 ENCOUNTER — Ambulatory Visit
Admission: RE | Admit: 2021-02-08 | Discharge: 2021-02-08 | Disposition: A | Discharge status: Home / Self Care | Payer: Medicare Other | Attending: Urology | Admitting: Urology

## 2021-02-08 ENCOUNTER — Ambulatory Visit: Payer: Medicare Other | Admitting: Nurse Practitioner

## 2021-02-08 ENCOUNTER — Other Ambulatory Visit: Payer: Self-pay

## 2021-02-08 ENCOUNTER — Ambulatory Visit
Admission: RE | Admit: 2021-02-08 | Discharge: 2021-02-08 | Disposition: A | Discharge status: Home / Self Care | Payer: Medicare Other | Source: Ambulatory Visit | Attending: Urology | Admitting: Urology

## 2021-02-08 ENCOUNTER — Ambulatory Visit (INDEPENDENT_AMBULATORY_CARE_PROVIDER_SITE_OTHER): Payer: Medicare Other | Admitting: Urology

## 2021-02-08 VITALS — BP 149/74 | HR 64 | Ht 64.0 in | Wt 175.0 lb

## 2021-02-08 DIAGNOSIS — N200 Calculus of kidney: Secondary | ICD-10-CM

## 2021-02-08 DIAGNOSIS — N390 Urinary tract infection, site not specified: Secondary | ICD-10-CM | POA: Diagnosis not present

## 2021-02-08 DIAGNOSIS — N3000 Acute cystitis without hematuria: Secondary | ICD-10-CM

## 2021-02-08 DIAGNOSIS — R109 Unspecified abdominal pain: Secondary | ICD-10-CM | POA: Diagnosis not present

## 2021-02-08 IMAGING — CR DG ABDOMEN 1V
1 series · 2 of 2 positions shown · non-contrast
Comparison: CT [DATE]

CLINICAL DATA: Kidney stones.  Abdominal pain.

EXAM:
ABDOMEN - 1 VIEW

[Series 1: dg abd 1 view · 0.14mm/px · 2 of 2 slices shown]
[im 1/2]
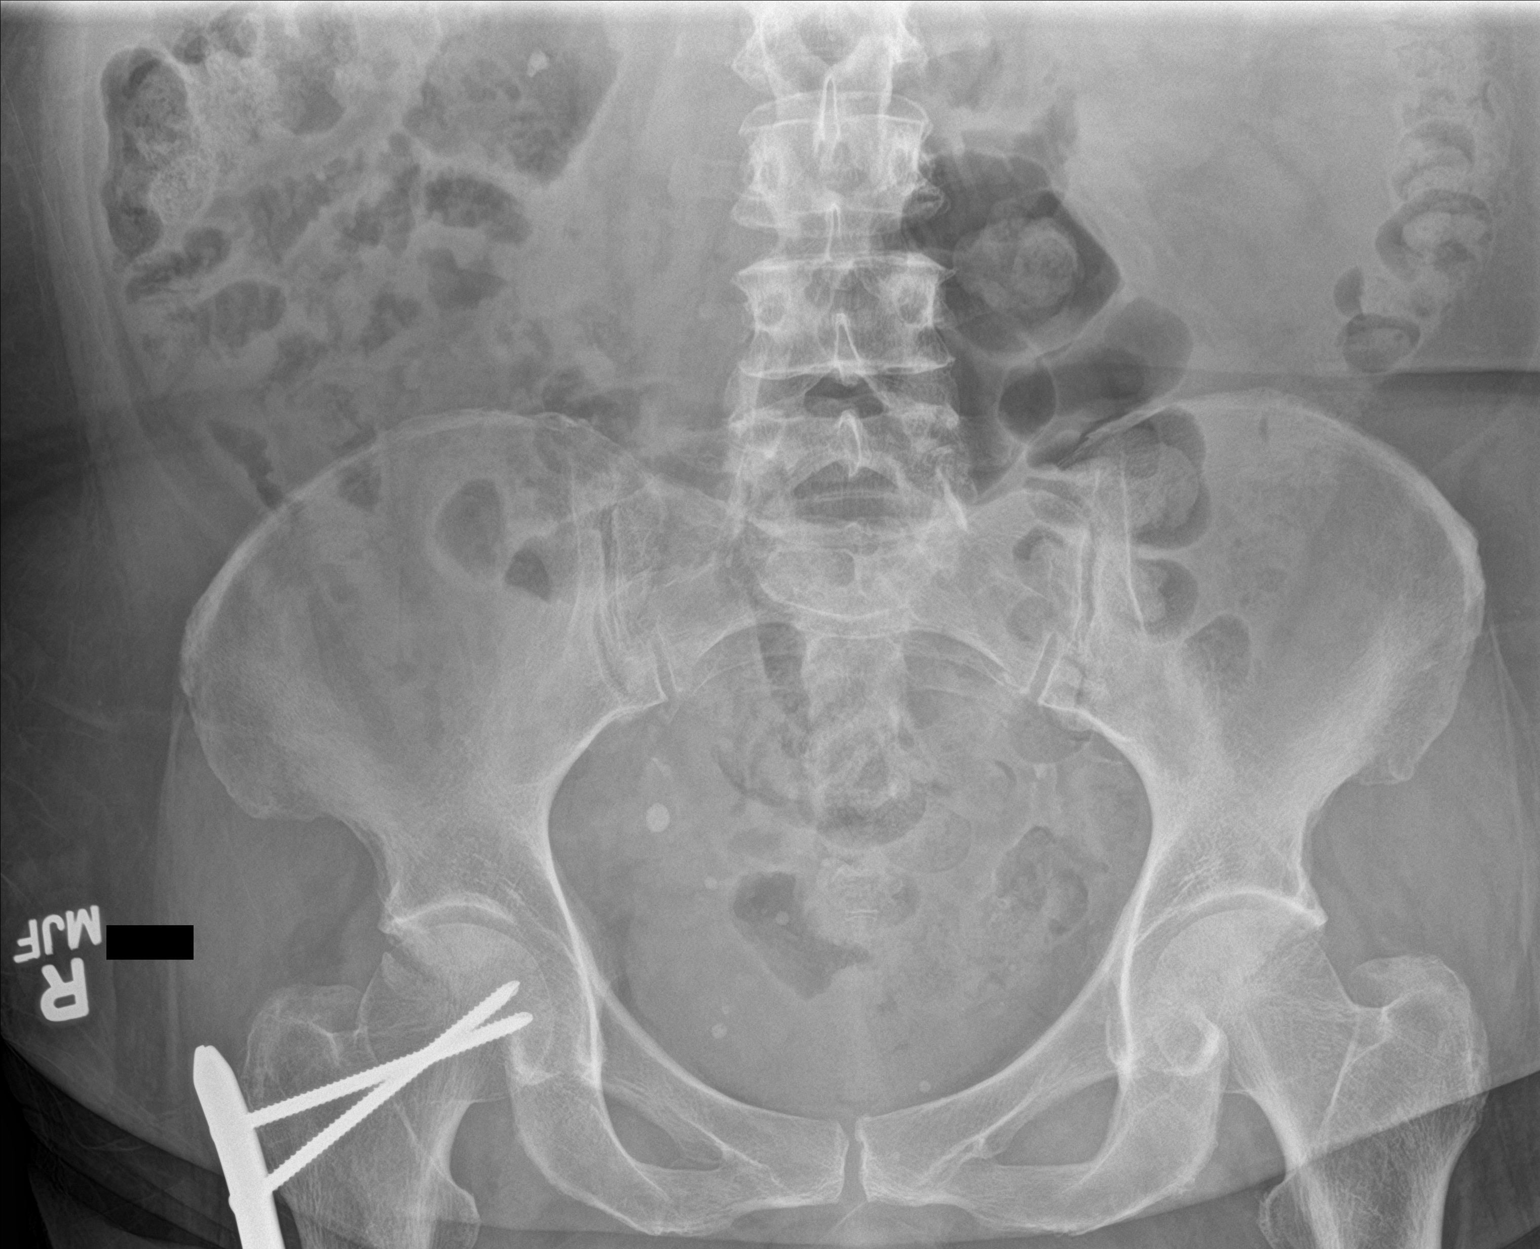
[im 2/2]
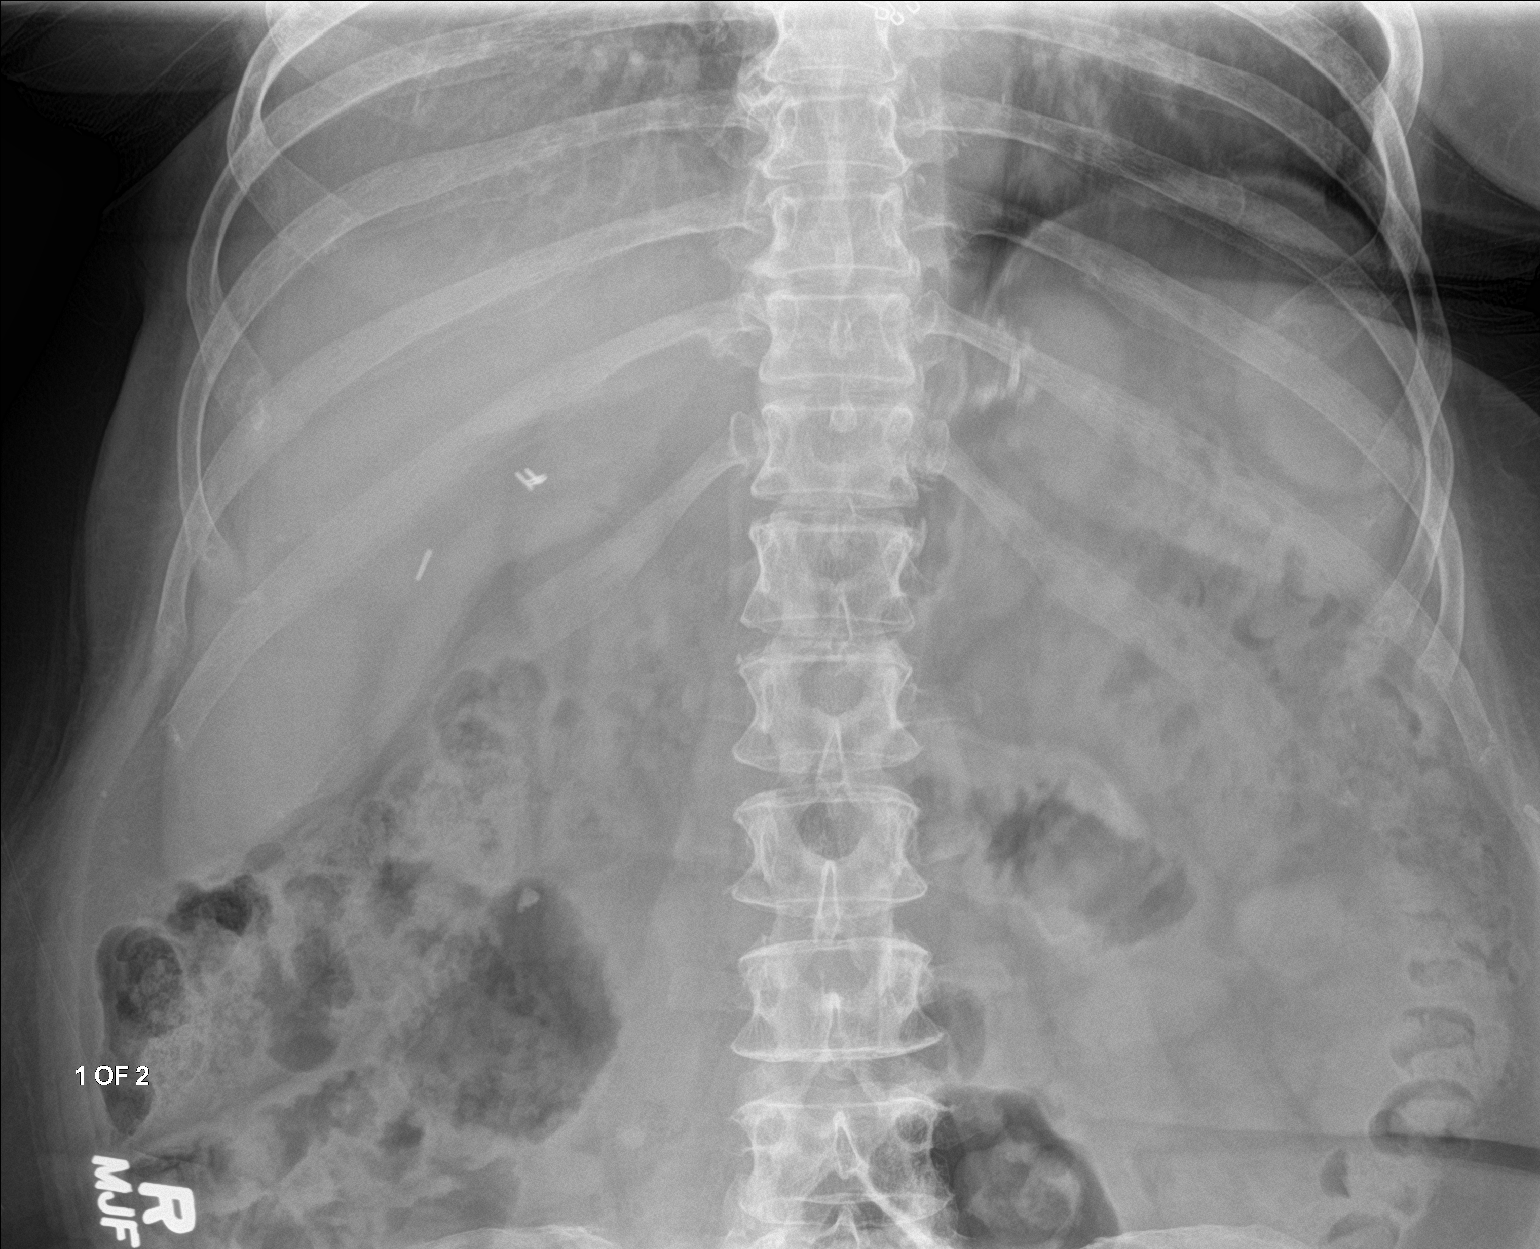

[2 of 2 positions shown; findings below may reference images not displayed]

FINDINGS: 6 mm stone projecting over the lower right renal shadow. No stones
project over the left kidney. Calcifications in the pelvis are
consistent with phleboliths. Normal bowel gas pattern with moderate
colonic stool. Cholecystectomy clips in the right upper quadrant.
Enteric sutures in the left upper quadrant and left mid abdomen. No
evidence of obstruction. No acute osseous abnormalities are seen.
Surgical hardware in the right proximal femur is partially included.
IMPRESSION: A 6 mm stone projects over the lower right renal shadow.

## 2021-02-08 MED ORDER — AMOXICILLIN-POT CLAVULANATE 875-125 MG PO TABS: 1.0000 | ORAL_TABLET | Freq: Two times a day (BID) | ORAL | 0 refills | Status: DC

## 2021-02-08 MED ORDER — CEFTRIAXONE SODIUM 1 G IJ SOLR
1.0000 g | INTRAMUSCULAR | Status: DC
  Administered 2021-02-08: 1 g via INTRAMUSCULAR

## 2021-02-08 MED ORDER — ESTRADIOL 0.1 MG/GM VA CREA
TOPICAL_CREAM | VAGINAL | 1 refills | Status: DC
Start: 2021-02-08 — End: 2021-07-20

## 2021-02-08 NOTE — Patient Instructions (Signed)
1.  Resume using topical estrogen cream.  She will use this 3 times a week per urethra, a pea-sized amount Monday Wednesday Friday.  Avoid sexual activity after applying this and wash her hands well both before and after.  2.  We recommend daily probiotic supplementation, cranberry tablets daily, and a supplement called D mannose.  The symptoms can help with UTI prevention.  3. Me think you have a UTI or UTI symptoms, please call our office to see one of our PAs for same or next day visit.

## 2021-02-09 ENCOUNTER — Telehealth: Payer: Self-pay | Admitting: *Deleted

## 2021-02-09 LAB — MICROSCOPIC EXAMINATION: WBC, UA: >30 /hpf — AB (ref 0–5)

## 2021-02-09 LAB — URINALYSIS, COMPLETE
Bilirubin, UA: NEGATIVE
Ketones, UA: NEGATIVE
Nitrite, UA: POSITIVE — AB
Specific Gravity, UA: 1.025 (ref 1.005–1.030)
Urobilinogen, Ur: 0.2 mg/dL (ref 0.2–1.0)
pH, UA: 5.5 (ref 5.0–7.5)

## 2021-02-09 NOTE — Telephone Encounter (Signed)
Pt calling asking for results of KUB 02/08/2021. Please advise

## 2021-02-12 NOTE — Telephone Encounter (Signed)
Patient advised-voiced understanding.

## 2021-02-12 NOTE — Telephone Encounter (Signed)
The x-ray was not read until yesterday.  The stone is in the same position and has not moved.  Hollice Espy, MD

## 2021-02-13 ENCOUNTER — Ambulatory Visit: Payer: Medicare Other | Admitting: Nurse Practitioner

## 2021-02-14 ENCOUNTER — Ambulatory Visit: Payer: Medicare Other | Admitting: Internal Medicine

## 2021-02-14 NOTE — Progress Notes (Signed)
BP 137/76 (BP Location: Left Arm, Cuff Size: Normal)   Pulse 82   Temp 98.6 F (37 C) (Oral)   Ht 5' 3.5" (1.613 m)   Wt 181 lb 6.4 oz (82.3 kg)   SpO2 99%   BMI 31.63 kg/m    Subjective:    Patient ID: Lisa Matthews, female    DOB: 06/23/1955, 65 y.o.   MRN: [id number]  HPI: Lisa Matthews is a 65 y.o. female  Chief Complaint  Patient presents with   Back Pain    Pt states she has been having low back pain for the last 3 weeks. States she is also currently being treated for a UTI   BACK PAIN Duration: weeks Mechanism of injury: no trauma Location: bilateral Onset: gradual Severity: 8/10 Quality: sharp, aching, burning, and shooting Frequency: constant Radiation: none Aggravating factors:  standing, walking, and bending Alleviating factors: nothing Status: stable Treatments attempted:  voltaren, bioflex, ibuprofen, oxycodone   Relief with NSAIDs?: No NSAIDs Taken Nighttime pain:  no Paresthesias / decreased sensation:  no Bowel / bladder incontinence:  no Fevers:  no Dysuria / urinary frequency:  no   Patient also complains of burning with urination.  She has been treated with 3 different antibiotics.  She will complete the course of Augmentin tomorrow.  Patient states she still has symptoms.    Relevant past medical, surgical, family and social history reviewed and updated as indicated. Interim medical history since our last visit reviewed. Allergies and medications reviewed and updated.  Review of Systems  Genitourinary:  Positive for dysuria.  Musculoskeletal:  Positive for back pain.   Per HPI unless specifically indicated above     Objective:    BP 137/76 (BP Location: Left Arm, Cuff Size: Normal)   Pulse 82   Temp 98.6 F (37 C) (Oral)   Ht 5' 3.5" (1.613 m)   Wt 181 lb 6.4 oz (82.3 kg)   SpO2 99%   BMI 31.63 kg/m   Wt Readings from Last 3 Encounters:  02/15/21 181 lb 6.4 oz (82.3 kg)  02/08/21 175 lb (79.4 kg)  01/09/21 181 lb 3.2 oz  (82.2 kg)    Physical Exam Vitals and nursing note reviewed.  Constitutional:      General: She is not in acute distress.    Appearance: Normal appearance. She is normal weight. She is not ill-appearing, toxic-appearing or diaphoretic.  HENT:     Head: Normocephalic.     Right Ear: External ear normal.     Left Ear: External ear normal.     Nose: Nose normal.     Mouth/Throat:     Mouth: Mucous membranes are moist.     Pharynx: Oropharynx is clear.  Eyes:     General:        Right eye: No discharge.        Left eye: No discharge.     Extraocular Movements: Extraocular movements intact.     Conjunctiva/sclera: Conjunctivae normal.     Pupils: Pupils are equal, round, and reactive to light.  Cardiovascular:     Rate and Rhythm: Normal rate and regular rhythm.     Heart sounds: No murmur heard. Pulmonary:     Effort: Pulmonary effort is normal. No respiratory distress.     Breath sounds: Normal breath sounds. No wheezing or rales.  Musculoskeletal:     Cervical back: Normal range of motion and neck supple.     Lumbar back: Tenderness present. Decreased range of motion.  Comments: Patient had difficulty moving during exam.  Skin:    General: Skin is warm and dry.     Capillary Refill: Capillary refill takes less than 2 seconds.  Neurological:     General: No focal deficit present.     Mental Status: She is alert and oriented to person, place, and time. Mental status is at baseline.  Psychiatric:        Mood and Affect: Mood normal.        Behavior: Behavior normal.        Thought Content: Thought content normal.        Judgment: Judgment normal.    Results for orders placed or performed in visit on 02/08/21  CULTURE, URINE COMPREHENSIVE   Specimen: Urine   UR  Result Value Ref Range   Urine Culture, Comprehensive Preliminary report (A)    Organism ID, Bacteria Gram negative rods (A)   Microscopic Examination   Urine  Result Value Ref Range   WBC, UA >30 (A) 0 - 5  /hpf   RBC 0-2 0 - 2 /hpf   Epithelial Cells (non renal) 0-10 0 - 10 /hpf   Crystals Present (A) N/A   Crystal Type Calcium Oxalate N/A   Bacteria, UA Many (A) None seen/Few  Urinalysis, Complete  Result Value Ref Range   Specific Gravity, UA 1.025 1.005 - 1.030   pH, UA 5.5 5.0 - 7.5   Color, UA Yellow Yellow   Appearance Ur Cloudy (A) Clear   Leukocytes,UA 2+ (A) Negative   Protein,UA Trace (A) Negative/Trace   Glucose, UA 2+ (A) Negative   Ketones, UA Negative Negative   RBC, UA Trace (A) Negative   Bilirubin, UA Negative Negative   Urobilinogen, Ur 0.2 0.2 - 1.0 mg/dL   Nitrite, UA Positive (A) Negative   Microscopic Examination See below:       Assessment & Plan:   Problem List Items Addressed This Visit       Other   Low back pain    Has been going on for about 3 weeks.  Will order xray of lumbar spine to evaluate back pain.  Patient has reached out to her pain management doctor but has not seen them for this particular issue.  Patient requested referral to chiropractor which was placed during visit today. Will reach out to referral coordinator to assist getting patient in with chiropractor quickly due to patient's severe pain.  Will make further recommendations based on imaging results.       Relevant Orders   DG Lumbar Spine Complete   Ambulatory referral to Chiropractic   Other Visit Diagnoses     Acute cystitis with hematuria    -  Primary   UA did not show bacteria during office visit. Will send for culture to see if bacteria has cleared. Will reach out to Urology's office for further assistance.   Relevant Orders   Urinalysis, Routine w reflex microscopic        Follow up plan: Return if symptoms worsen or fail to improve.   A total of 30 minutes were spent on this encounter today.  When total time is documented, this includes both the face-to-face and non-face-to-face time personally spent before, during and after the visit on the date of the encounter  discussing plan of care for back pain and course of treatment for UTI that patient has already had and plan moving forward.

## 2021-02-15 ENCOUNTER — Other Ambulatory Visit: Payer: Self-pay

## 2021-02-15 ENCOUNTER — Encounter: Payer: Self-pay | Admitting: Nurse Practitioner

## 2021-02-15 ENCOUNTER — Ambulatory Visit: Payer: Medicare Other | Admitting: Nurse Practitioner

## 2021-02-15 ENCOUNTER — Ambulatory Visit (INDEPENDENT_AMBULATORY_CARE_PROVIDER_SITE_OTHER): Payer: Medicare Other | Admitting: Nurse Practitioner

## 2021-02-15 ENCOUNTER — Telehealth: Payer: Self-pay | Admitting: Nurse Practitioner

## 2021-02-15 VITALS — BP 137/76 | HR 82 | Temp 98.6°F | Ht 63.5 in | Wt 181.4 lb

## 2021-02-15 DIAGNOSIS — M545 Low back pain, unspecified: Secondary | ICD-10-CM | POA: Diagnosis not present

## 2021-02-15 DIAGNOSIS — N3001 Acute cystitis with hematuria: Secondary | ICD-10-CM | POA: Diagnosis not present

## 2021-02-15 LAB — URINALYSIS, ROUTINE W REFLEX MICROSCOPIC
Bilirubin, UA: NEGATIVE
Ketones, UA: NEGATIVE
Leukocytes,UA: NEGATIVE
Nitrite, UA: NEGATIVE
Protein,UA: NEGATIVE
RBC, UA: NEGATIVE
Specific Gravity, UA: 1.02 (ref 1.005–1.030)
Urobilinogen, Ur: 0.2 mg/dL (ref 0.2–1.0)
pH, UA: 6 (ref 5.0–7.5)

## 2021-02-15 NOTE — Progress Notes (Signed)
Results discussed with patient in office this morning.

## 2021-02-15 NOTE — Assessment & Plan Note (Signed)
Has been going on for about 3 weeks.  Will order xray of lumbar spine to evaluate back pain.  Patient has reached out to her pain management doctor but has not seen them for this particular issue.  Patient requested referral to chiropractor which was placed during visit today. Will reach out to referral coordinator to assist getting patient in with chiropractor quickly due to patient's severe pain.  Will make further recommendations based on imaging results.

## 2021-02-15 NOTE — Telephone Encounter (Signed)
Can you please reach out to Dr. Cherrie Gauze office to let her know that the patient was here this morning for lower back pain and still complaining of burning with urination.  She will complete the Augmentin tomorrow.  Does she want the patient to come back in to see her.  We did a UA this morning if she would like to review it.

## 2021-02-17 LAB — CULTURE, URINE COMPREHENSIVE

## 2021-02-19 ENCOUNTER — Other Ambulatory Visit: Payer: Self-pay | Admitting: Internal Medicine

## 2021-02-19 NOTE — Telephone Encounter (Signed)
Requested medication (s) are due for refill today:yes  Requested medication (s) are on the active medication list:yes   Last refill: 01/19/21 #30  0 refills  Future visit scheduled yes 04/17/21  Notes to clinic: not delegated  Requested Prescriptions  Pending Prescriptions Disp Refills   tiZANidine (ZANAFLEX) 4 MG tablet [Pharmacy Med Name: tiZANidine HCl 4 MG Oral Tablet] 30 tablet 0    Sig: TAKE 1 TABLET BY MOUTH AT BEDTIME . APPOINTMENT REQUIRED FOR FUTURE REFILLS     Not Delegated - Cardiovascular:  Alpha-2 Agonists - tizanidine Failed - 02/19/2021 11:21 AM      Failed - This refill cannot be delegated      Passed - Valid encounter within last 6 months    Recent Outpatient Visits           4 days ago Acute cystitis with hematuria   [facility] Jon Billings, NP   1 month ago Type 2 diabetes mellitus without complication, without long-term current use of insulin ([place])   [place], Lauren A, NP   2 months ago Painful urination   [facility] Vigg, Avanti, MD   3 months ago [place], Lauren A, NP   4 months ago Painful urination   [facility] Vigg, Avanti, MD       Future Appointments             Tomorrow Virgel Manifold, MD [place]   In 1 month Vigg, Avanti, MD [facility]            Signed Prescriptions Disp Refills   metFORMIN (GLUCOPHAGE) 500 MG tablet 180 tablet 0    Sig: TAKE 1 TABLET BY MOUTH TWICE DAILY WITH A MEAL     Endocrinology:  Diabetes - Biguanides Passed - 02/19/2021 11:21 AM      Passed - Cr in normal range and within 360 days    Creatinine, Ser  Date Value Ref Range Status  01/02/2021 0.75 0.57 - 1.00 mg/dL Final          Passed - HBA1C is between 0 and 7.9 and within 180 days    HB A1C (BAYER DCA - WAIVED)  Date Value Ref Range Status  01/02/2021 7.5 (H) <7.0 % Final    Comment:                                           Diabetic Adult            <7.0                                       Healthy Adult        4.3 - 5.7                                                           (DCCT/NGSP) American Diabetes Association's Summary of Glycemic Recommendations for Adults with Diabetes: Hemoglobin A1c <7.0%. More stringent glycemic goals (A1c <6.0%) may further reduce complications at the cost of increased risk of hypoglycemia.  Passed - eGFR in normal range and within 360 days    eGFR  Date Value Ref Range Status  01/02/2021 89 >59 mL/min/1.73 Final          Passed - Valid encounter within last 6 months    Recent Outpatient Visits           4 days ago Acute cystitis with hematuria   [facility] Jon Billings, NP   1 month ago Type 2 diabetes mellitus without complication, without long-term current use of insulin ([place])   [place], Lauren A, NP   2 months ago Painful urination   [facility] Vigg, Avanti, MD   3 months ago [place], Lauren A, NP   4 months ago Painful urination   [facility] Vigg, Avanti, MD       Future Appointments             Tomorrow Virgel Manifold, MD [place]   In 1 month Vigg, Avanti, MD [facility] [place]

## 2021-02-19 NOTE — Telephone Encounter (Signed)
Spoke with patient to follow up with how she is feeling. Patient states she is still having the soreness in her lower back and a little burning with urination. Patient states she does not have an upcoming appointment with Urology as she has been out of town and has not reached out to their office. Patient states she would reach out for an appointment and give our office a call back if she has any other questions or concerns.

## 2021-02-20 ENCOUNTER — Ambulatory Visit: Payer: Medicare Other | Admitting: Gastroenterology

## 2021-02-21 ENCOUNTER — Ambulatory Visit (INDEPENDENT_AMBULATORY_CARE_PROVIDER_SITE_OTHER): Payer: Medicare Other

## 2021-02-21 DIAGNOSIS — Z0000 Encounter for general adult medical examination without abnormal findings: Secondary | ICD-10-CM

## 2021-02-21 DIAGNOSIS — M170 Bilateral primary osteoarthritis of knee: Secondary | ICD-10-CM | POA: Diagnosis not present

## 2021-02-21 DIAGNOSIS — H9319 Tinnitus, unspecified ear: Secondary | ICD-10-CM

## 2021-02-21 NOTE — Patient Instructions (Signed)
Health Maintenance, Female Adopting a healthy lifestyle and getting preventive care are important in promoting health and wellness. Ask your health care provider about: The right schedule for you to have regular tests and exams. Things you can do on your own to prevent diseases and keep yourself healthy. What should I know about diet, weight, and exercise? Eat a healthy diet  Eat a diet that includes plenty of vegetables, fruits, low-fat dairy products, and lean protein. Do not eat a lot of foods that are high in solid fats, added sugars, or sodium. Maintain a healthy weight Body mass index (BMI) is used to identify weight problems. It estimates body fat based on height and weight. Your health care provider can help determine your BMI and help you achieve or maintain a healthy weight. Get regular exercise Get regular exercise. This is one of the most important things you can do for your health. Most adults should: Exercise for at least 150 minutes each week. The exercise should increase your heart rate and make you sweat (moderate-intensity exercise). Do strengthening exercises at least twice a week. This is in addition to the moderate-intensity exercise. Spend less time sitting. Even light physical activity can be beneficial. Watch cholesterol and blood lipids Have your blood tested for lipids and cholesterol at 64 years of age, then have this test every 5 years. Have your cholesterol levels checked more often if: Your lipid or cholesterol levels are high. You are older than 64 years of age. You are at high risk for heart disease. What should I know about cancer screening? Depending on your health history and family history, you may need to have cancer screening at various ages. This may include screening for: Breast cancer. Cervical cancer. Colorectal cancer. Skin cancer. Lung cancer. What should I know about heart disease, diabetes, and high blood pressure? Blood pressure and heart  disease High blood pressure causes heart disease and increases the risk of stroke. This is more likely to develop in people who have high blood pressure readings, are of African descent, or are overweight. Have your blood pressure checked: Every 3-5 years if you are 18-39 years of age. Every year if you are 40 years old or older. Diabetes Have regular diabetes screenings. This checks your fasting blood sugar level. Have the screening done: Once every three years after age 40 if you are at a normal weight and have a low risk for diabetes. More often and at a younger age if you are overweight or have a high risk for diabetes. What should I know about preventing infection? Hepatitis B If you have a higher risk for hepatitis B, you should be screened for this virus. Talk with your health care provider to find out if you are at risk for hepatitis B infection. Hepatitis C Testing is recommended for: Everyone born from 1945 through 1965. Anyone with known risk factors for hepatitis C. Sexually transmitted infections (STIs) Get screened for STIs, including gonorrhea and chlamydia, if: You are sexually active and are younger than 64 years of age. You are older than 64 years of age and your health care provider tells you that you are at risk for this type of infection. Your sexual activity has changed since you were last screened, and you are at increased risk for chlamydia or gonorrhea. Ask your health care provider if you are at risk. Ask your health care provider about whether you are at high risk for HIV. Your health care provider may recommend a prescription medicine   to help prevent HIV infection. If you choose to take medicine to prevent HIV, you should first get tested for HIV. You should then be tested every 3 months for as long as you are taking the medicine. Pregnancy If you are about to stop having your period (premenopausal) and you may become pregnant, seek counseling before you get  pregnant. Take 400 to 800 micrograms (mcg) of folic acid every day if you become pregnant. Ask for birth control (contraception) if you want to prevent pregnancy. Osteoporosis and menopause Osteoporosis is a disease in which the bones lose minerals and strength with aging. This can result in bone fractures. If you are 65 years old or older, or if you are at risk for osteoporosis and fractures, ask your health care provider if you should: Be screened for bone loss. Take a calcium or vitamin D supplement to lower your risk of fractures. Be given hormone replacement therapy (HRT) to treat symptoms of menopause. Follow these instructions at home: Lifestyle Do not use any products that contain nicotine or tobacco, such as cigarettes, e-cigarettes, and chewing tobacco. If you need help quitting, ask your health care provider. Do not use street drugs. Do not share needles. Ask your health care provider for help if you need support or information about quitting drugs. Alcohol use Do not drink alcohol if: Your health care provider tells you not to drink. You are pregnant, may be pregnant, or are planning to become pregnant. If you drink alcohol: Limit how much you use to 0-1 drink a day. Limit intake if you are breastfeeding. Be aware of how much alcohol is in your drink. In the [place] one drink equals one 12 oz bottle of beer (355 mL), one 5 oz glass of wine (148 mL), or one 1 oz glass of hard liquor (44 mL). General instructions Schedule regular health, dental, and eye exams. Stay current with your vaccines. Tell your health care provider if: You often feel depressed. You have ever been abused or do not feel safe at home. Summary Adopting a healthy lifestyle and getting preventive care are important in promoting health and wellness. Follow your health care provider's instructions about healthy diet, exercising, and getting tested or screened for diseases. Follow your health care provider's  instructions on monitoring your cholesterol and blood pressure. This information is not intended to replace advice given to you by your health care provider. Make sure you discuss any questions you have with your health care provider. Document Revised: 07/28/2020 Document Reviewed: 05/13/2018 Elsevier Patient Education  2022 Elsevier Inc.  

## 2021-02-21 NOTE — Progress Notes (Signed)
Subjective:   Lisa Matthews is a 65 y.o. female who presents for an Initial Medicare Annual Wellness Visit.I connected with  Lisa Matthews on 02/21/21 by a audio enabled telemedicine application and verified that I am speaking with the correct person using two identifiers.   I discussed the limitations of evaluation and management by telemedicine. The patient expressed understanding and agreed to proceed.  Location of patient:home Location of provider:office Lisa Matthews participated in this visit   Review of Systems    Defer to PCP       Objective:    Today's Vitals   02/21/21 1847  PainSc: 4    There is no height or weight on file to calculate BMI.  Advanced Directives 02/21/2021 12/11/2020 12/07/2020  Does Patient Have a Medical Advance Directive? No No No  Would patient like information on creating a medical advance directive? No - Patient declined - No - Patient declined    Current Medications (verified) Outpatient Encounter Medications as of 02/21/2021  Medication Sig   ACCU-CHEK GUIDE test strip USE TO CHECK BLOOD SUGAR 4 TIMES DAILY   acetaminophen (TYLENOL) 650 MG CR tablet Take by mouth as needed.   amLODipine (NORVASC) 2.5 MG tablet Take 1 tablet by mouth once daily   aspirin EC 81 MG tablet Take 1 tablet (81 mg total) by mouth daily. Swallow whole.   atenolol (TENORMIN) 100 MG tablet Take by mouth.   Biotin w/ Vitamins C & E (HAIR/SKIN/NAILS PO) Take by mouth daily.   Blood Glucose Monitoring Suppl (ACCU-CHEK GUIDE ME) w/Device KIT CHECK BLOOD SUGAR 4 TIMES DAILY   Calcium Carbonate-Vitamin D 600-400 MG-UNIT tablet Take 1 tablet by mouth daily.   Cholecalciferol 25 MCG (1000 UT) tablet Take 1,000 Units by mouth daily.   cyanocobalamin 1000 MCG tablet Take 1,000 mcg by mouth daily.   dapagliflozin propanediol (FARXIGA) 5 MG TABS tablet Take 1 tablet (5 mg total) by mouth daily before breakfast.   diclofenac Sodium (VOLTAREN) 1 % GEL Apply 2 g topically 4 (four)  times daily.   ELDERBERRY PO Take by mouth daily.   estradiol (ESTRACE) 0.1 MG/GM vaginal cream Estrogen Cream Instruction Discard applicator Apply pea sized amount to tip of finger to urethra before bed. Wash hands well after application. Use Monday, Wednesday and Friday   fenofibrate (TRICOR) 145 MG tablet Take 1 tablet (145 mg total) by mouth daily.   gabapentin (NEURONTIN) 300 MG capsule Take 1 capsule (300 mg total) by mouth 2 (two) times daily. (Patient taking differently: Take 300 mg by mouth 4 (four) times daily.)   ibuprofen (ADVIL) 600 MG tablet Take 1 tablet (600 mg total) by mouth 2 (two) times daily as needed.   Lancets MISC    linaclotide (LINZESS) 145 MCG CAPS capsule Take 1 capsule (145 mcg total) by mouth daily before breakfast.   losartan (COZAAR) 25 MG tablet Take 1 tablet by mouth once daily   meloxicam (MOBIC) 15 MG tablet Take 1 tablet (15 mg total) by mouth daily.   metFORMIN (GLUCOPHAGE) 500 MG tablet TAKE 1 TABLET BY MOUTH TWICE DAILY WITH A MEAL   mirtazapine (REMERON) 30 MG tablet Take 30 mg by mouth.   omeprazole (PRILOSEC) 40 MG capsule Take 1 capsule by mouth once daily   oxyCODONE (OXY IR/ROXICODONE) 5 MG immediate release tablet Take 5 mg by mouth 3 (three) times daily as needed.   promethazine (PHENERGAN) 12.5 MG tablet Take 1 tablet (12.5 mg total) by mouth every 8 (eight) hours  as needed for nausea or vomiting.   Teriparatide, Recombinant, 620 MCG/2.48ML SOPN Inject into the skin at bedtime.   tiZANidine (ZANAFLEX) 4 MG tablet TAKE 1 TABLET BY MOUTH AT BEDTIME . APPOINTMENT REQUIRED FOR FUTURE REFILLS   venlafaxine XR (EFFEXOR-XR) 150 MG 24 hr capsule Take 1 capsule (150 mg total) by mouth in the morning and at bedtime.   vitamin C (ASCORBIC ACID) 500 MG tablet Take 500 mg by mouth daily.   zinc gluconate 50 MG tablet Take 50 mg by mouth daily.   amoxicillin-clavulanate (AUGMENTIN) 875-125 MG tablet Take 1 tablet by mouth every 12 (twelve) hours. (Patient not  taking: Reported on 02/21/2021)   No facility-administered encounter medications on file as of 02/21/2021.    Allergies (verified) Sitagliptin, Atorvastatin, Bupropion, Nitrofurantoin macrocrystal, Nsaids, Other, Pravastatin, Ciprofloxacin, Doxycycline, Exenatide, Ezetimibe, Fluconazole, Morphine, Oxybutynin, Penicillins, Prednisone, Simvastatin, Sulfa antibiotics, Trazodone, and Wound dressing adhesive   History: Past Medical History:  Diagnosis Date   Allergy    Anxiety    Arthritis    COPD (chronic obstructive pulmonary disease) ([place])    Depression    Diabetes mellitus without complication ([place])    Heart murmur    Hypertension    Kidney stones    Osteoporosis    Past Surgical History:  Procedure Laterality Date   CESAREAN SECTION     CYSTOSCOPY/URETEROSCOPY/HOLMIUM LASER/STENT PLACEMENT     FRACTURE SURGERY     JOINT REPLACEMENT Right    knee   TUBAL LIGATION     Family History  Problem Relation Age of Onset   Heart attack Mother 20   Emphysema Father    Heart attack Father 89   Heart disease Father    Cancer Sister    Social History   Socioeconomic History   Marital status: Widowed    Spouse name: Not on file   Number of children: Not on file   Years of education: Not on file   Highest education level: Not on file  Occupational History   Not on file  Tobacco Use   Smoking status: Never   Smokeless tobacco: Never  Vaping Use   Vaping Use: Every day  Substance and Sexual Activity   Alcohol use: Never   Drug use: Never   Sexual activity: Yes    Birth control/protection: Surgical  Other Topics Concern   Not on file  Social History Narrative   Not on file   Social Determinants of Health   Financial Resource Strain: Medium Risk   Difficulty of Paying Living Expenses: Somewhat hard  Food Insecurity: No Food Insecurity   Worried About Running Out of Food in the Last Year: Never true   Ran Out of Food in the Last Year: Never true  Transportation Needs:  No Transportation Needs   Lack of Transportation (Medical): No   Lack of Transportation (Non-Medical): No  Physical Activity: Sufficiently Active   Days of Exercise per Week: 7 days   Minutes of Exercise per Session: 60 min  Stress: No Stress Concern Present   Feeling of Stress : Not at all  Social Connections: Socially Isolated   Frequency of Communication with Friends and Family: Never   Frequency of Social Gatherings with Friends and Family: Never   Attends Religious Services: Never   Marine scientist or Organizations: No   Attends Archivist Meetings: Never   Marital Status: Widowed    Tobacco Counseling Counseling given: Not Answered   Clinical Intake:  Pre-visit preparation completed: Yes  Pain : 0-10 Pain Score: 4  Pain Type: Chronic pain Pain Location: Knee Pain Descriptors / Indicators: Aching Pain Onset: More than a month ago Pain Frequency: Several days a week Pain Relieving Factors: medication  Pain Relieving Factors: medication  Nutritional Status: BMI > 30  Obese Nutritional Risks: Unintentional weight loss Diabetes: Yes CBG done?: Yes CBG resulted in Enter/ Edit results?: Yes Did pt. bring in CBG monitor from home?: No  How often do you need to have someone help you when you read instructions, pamphlets, or other written materials from your doctor or pharmacy?: 1 - Never  Diabetic?yes  Interpreter Needed?: No  Information entered by :: Linus Galas, CMA   Activities of Daily Living No flowsheet data found.  Patient Care Team: Charlynne Cousins, MD as PCP - General (Internal Medicine)  Indicate any recent Medical Services you may have received from other than [facility] providers in the past year (date may be approximate).     Assessment:   This is a routine wellness examination for [place].  Hearing/Vision screen No results found.  Dietary issues and exercise activities discussed: Current Exercise Habits: The patient does not  participate in regular exercise at present, Exercise limited by: orthopedic condition(s)   Goals Addressed   None   Depression Screen PHQ 2/9 Scores 02/21/2021 01/09/2021 12/07/2020 11/22/2020 10/18/2020 10/05/2020 09/25/2020  PHQ - 2 Score 0 0 0 0 0 0 0  PHQ- 9 Score 3 - - - - - 2    Fall Risk Fall Risk  02/21/2021 01/09/2021 12/07/2020 11/22/2020 10/18/2020  Falls in the past year? 1 1 0 1 0  Number falls in past yr: 1 1 - 1 0  Injury with Fall? 1 1 - 1 0  Risk for fall due to : History of fall(s);Impaired balance/gait;Impaired mobility - - - -  Follow up Falls evaluation completed Falls evaluation completed - Falls evaluation completed Falls evaluation completed    FALL RISK PREVENTION PERTAINING TO THE HOME:  Any stairs in or around the home? Yes  If so, are there any without handrails? Yes  Home free of loose throw rugs in walkways, pet beds, electrical cords, etc? No  Adequate lighting in your home to reduce risk of falls? Yes   ASSISTIVE DEVICES UTILIZED TO PREVENT FALLS:  Life alert? No  Use of a cane, Jasenia Weilbacher or w/c? Yes  Grab bars in the bathroom? Yes  Shower chair or bench in shower? No  Elevated toilet seat or a handicapped toilet? Yes   TIMED UP AND GO:  Was the test performed?  N/A .  Length of time to ambulate 10 feet: N/A sec.     Cognitive Function:     6CIT Screen 02/21/2021  What Year? 0 points  What month? 0 points  What time? 0 points  Count back from 20 0 points  Months in reverse 0 points    Immunizations Immunization History  Administered Date(s) Administered   Influenza, Seasonal, Injecte, Preservative Fre 02/19/2008, 03/23/2011, 03/16/2012   Influenza,inj,Quad PF,6+ Mos 03/29/2013   Influenza-Unspecified 05/03/2014, 03/31/2015, 04/11/2016   PPD Test 05/05/2009   Pneumococcal Polysaccharide-23 01/20/2008   Pneumococcal-Unspecified 03/04/2015    TDAP status: Due, Education has been provided regarding the importance of this vaccine. Advised may  receive this vaccine at local pharmacy or Health Dept. Aware to provide a copy of the vaccination record if obtained from local pharmacy or Health Dept. Verbalized acceptance and understanding.  Flu Vaccine status: Up to date  Pneumococcal vaccine  status: Due, Education has been provided regarding the importance of this vaccine. Advised may receive this vaccine at local pharmacy or Health Dept. Aware to provide a copy of the vaccination record if obtained from local pharmacy or Health Dept. Verbalized acceptance and understanding.  Covid-19 vaccine status: Information provided on how to obtain vaccines.   Qualifies for Shingles Vaccine? yes  Zostavax completed No   Shingrix Completed?: No.    Education has been provided regarding the importance of this vaccine. Patient has been advised to call insurance company to determine out of pocket expense if they have not yet received this vaccine. Advised may also receive vaccine at local pharmacy or Health Dept. Verbalized acceptance and understanding.  Screening Tests Health Maintenance  Topic Date Due   OPHTHALMOLOGY EXAM  Never done   PAP SMEAR-Modifier  Never done   COVID-19 Vaccine (1) 03/03/2021 (Originally 03/17/1956)   INFLUENZA VACCINE  04/03/2021 (Originally 01/01/2021)   Zoster Vaccines- Shingrix (1 of 2) 04/11/2021 (Originally 09/16/2006)   FOOT EXAM  10/05/2021 (Originally 09/16/1966)   MAMMOGRAM  10/05/2021 (Originally 09/16/2006)   COLONOSCOPY (Pts 45-65yr Insurance coverage will need to be confirmed)  10/05/2021 (Originally 09/15/2001)   TETANUS/TDAP  10/05/2021 (Originally 09/16/1975)   Hepatitis C Screening  10/05/2021 (Originally 09/16/1974)   HIV Screening  10/05/2021 (Originally 09/16/1971)   HEMOGLOBIN A1C  07/05/2021   HPV VACCINES  Aged Out    Health Maintenance  Health Maintenance Due  Topic Date Due   OPHTHALMOLOGY EXAM  Never done   PAP SMEAR-Modifier  Never done    Colorectal cancer screening: Type of screening:  Cologuard. Completed 8/22. Repeat every N/A years  Mammogram status: Ordered Patient declined. Pt provided with contact info and advised to call to schedule appt.   Bone Density status: Completed not sure. Results reflect: Bone density results: OSTEOPOROSIS. Repeat every   years.  Lung Cancer Screening: (Low Dose CT Chest recommended if Age 64-80years, 30 pack-year currently smoking OR have quit w/in 15years.) does not qualify.   Lung Cancer Screening Referral: not sure if she needs this  Additional Screening:  Hepatitis C Screening: does qualify; Completed not completed  Vision Screening: Recommended annual ophthalmology exams for early detection of glaucoma and other disorders of the eye. Is the patient up to date with their annual eye exam?  Yes  Who is the provider or what is the name of the office in which the patient attends annual eye exams?  If pt is not established with a provider, would they like to be referred to a provider to establish care? Yes .   Dental Screening: Recommended annual dental exams for proper oral hygiene  Community Resource Referral / Chronic Care Management: CRR required this visit?  No   CCM required this visit?  No      Plan:     I have personally reviewed and noted the following in the patient's chart:   Medical and social history Use of alcohol, tobacco or illicit drugs  Current medications and supplements including opioid prescriptions. Patient is currently taking opioid prescriptions. Information provided to patient regarding non-opioid alternatives. Patient advised to discuss non-opioid treatment plan with their provider. Functional ability and status Nutritional status Physical activity Advanced directives List of other physicians Hospitalizations, surgeries, and ER visits in previous 12 months Vitals Screenings to include cognitive, depression, and falls Referrals and appointments  In addition, I have reviewed and discussed with  patient certain preventive protocols, quality metrics, and best practice recommendations. A written  personalized care plan for preventive services as well as general preventive health recommendations were provided to patient.     Linus Galas, [place]   02/21/2021   Nurse Notes: non face to face- 60 minutes  Ms. Kris Mouton , Thank you for taking time to come for your Medicare Wellness Visit. I appreciate your ongoing commitment to your health goals. Please review the following plan we discussed and let me know if I can assist you in the future.   Referral placed for PT  These are the goals we discussed:  Goals   None     This is a list of the screening recommended for you and due dates:  Health Maintenance  Topic Date Due   Eye exam for diabetics  Never done   Pap Smear  Never done   COVID-19 Vaccine (1) 03/03/2021*   Flu Shot  04/03/2021*   Zoster (Shingles) Vaccine (1 of 2) 04/11/2021*   Complete foot exam   10/05/2021*   Mammogram  10/05/2021*   Colon Cancer Screening  10/05/2021*   Tetanus Vaccine  10/05/2021*   Hepatitis C Screening: USPSTF Recommendation to screen - Ages 18-79 yo.  10/05/2021*   HIV Screening  10/05/2021*   Hemoglobin A1C  07/05/2021   HPV Vaccine  Aged Out  *Topic was postponed. The date shown is not the original due date.

## 2021-03-02 DIAGNOSIS — E119 Type 2 diabetes mellitus without complications: Secondary | ICD-10-CM | POA: Diagnosis not present

## 2021-03-05 ENCOUNTER — Other Ambulatory Visit: Payer: Self-pay | Admitting: Internal Medicine

## 2021-03-05 NOTE — Telephone Encounter (Signed)
Requested medication (s) are due for refill today: Yes  Requested medication (s) are on the active medication list: Yes  Last refill:  02/19/21 (Tizanidine); 11/20/20 (Atenolol)  Future visit scheduled: Yes  Notes to clinic:  Unable to refill per protocol, cannot delegate. Ended med (Atenolol)-not sure if provider wants to refill     Requested Prescriptions  Pending Prescriptions Disp Refills   tiZANidine (ZANAFLEX) 4 MG tablet [Pharmacy Med Name: tiZANidine HCl 4 MG Oral Tablet] 30 tablet 0    Sig: TAKE 1 TABLET BY MOUTH AT BEDTIME. APPOINTMENT NEEDED FOR FURTHER REFILLS.     Not Delegated - Cardiovascular:  Alpha-2 Agonists - tizanidine Failed - 03/05/2021  9:26 AM      Failed - This refill cannot be delegated      Passed - Valid encounter within last 6 months    Recent Outpatient Visits           2 weeks ago Acute cystitis with hematuria   [facility] Jon Billings, NP   1 month ago Type 2 diabetes mellitus without complication, without long-term current use of insulin ([place])   [place], Lauren A, NP   3 months ago Painful urination   [facility] Vigg, Avanti, MD   4 months ago [place], Lauren A, NP   4 months ago Painful urination   [place] Vigg, Avanti, MD       Future Appointments             In 1 month Vigg, Avanti, MD [place]             atenolol (TENORMIN) 50 MG tablet [Pharmacy Med Name: Atenolol 50 MG Oral Tablet] 90 tablet 0    Sig: Take 1 tablet by mouth once daily     Cardiovascular:  Beta Blockers Passed - 03/05/2021  9:26 AM      Passed - Last BP in normal range    BP Readings from Last 1 Encounters:  02/15/21 137/76          Passed - Last Heart Rate in normal range    Pulse Readings from Last 1 Encounters:  02/15/21 82          Passed - Valid encounter within last 6 months    Recent Outpatient Visits            2 weeks ago Acute cystitis with hematuria   [facility] Jon Billings, NP   1 month ago Type 2 diabetes mellitus without complication, without long-term current use of insulin ([place])   [place], Lauren A, NP   3 months ago Painful urination   [facility] Vigg, Avanti, MD   4 months ago [place], Lauren A, NP   4 months ago Painful urination   [place] Vigg, Avanti, MD       Future Appointments             In 1 month Vigg, Avanti, MD [facility], [facility]            Signed Prescriptions Disp Refills   amLODipine (NORVASC) 2.5 MG tablet 90 tablet 0    Sig: Take 1 tablet by mouth once daily     Cardiovascular:  Calcium Channel Blockers Passed - 03/05/2021  9:26 AM      Passed - Last BP in normal range  BP Readings from Last 1 Encounters:  02/15/21 137/76          Passed - Valid encounter within last 6 months    Recent Outpatient Visits           2 weeks ago Acute cystitis with hematuria   [facility] Jon Billings, NP   1 month ago Type 2 diabetes mellitus without complication, without long-term current use of insulin ([place])   [place], Lauren A, NP   3 months ago Painful urination   [facility] Vigg, Avanti, MD   4 months ago [place], Lauren A, NP   4 months ago Painful urination   [place] Vigg, Avanti, MD       Future Appointments             In 1 month Vigg, Avanti, MD [facility], [facility]             ibuprofen (ADVIL) 600 MG tablet 30 tablet 0    Sig: Take 1 tablet by mouth twice daily as needed     Analgesics:  NSAIDS Passed - 03/05/2021  9:26 AM      Passed - Cr in normal range and within 360 days    Creatinine, Ser  Date Value Ref Range Status  01/02/2021 0.75 0.57 - 1.00 mg/dL Final          Passed - HGB in normal  range and within 360 days    Hemoglobin  Date Value Ref Range Status  01/02/2021 14.0 11.1 - 15.9 g/dL Final          Passed - Patient is not pregnant      Passed - Valid encounter within last 12 months    Recent Outpatient Visits           2 weeks ago Acute cystitis with hematuria   [facility] Jon Billings, NP   1 month ago Type 2 diabetes mellitus without complication, without long-term current use of insulin ([place])   [place], Lauren A, NP   3 months ago Painful urination   [place] Vigg, Avanti, MD   4 months ago [place], Lauren A, NP   4 months ago Painful urination   [place] Vigg, Avanti, MD       Future Appointments             In 1 month Vigg, Avanti, MD [place]            Refused Prescriptions Disp Refills   metFORMIN (GLUCOPHAGE) 500 MG tablet [Pharmacy Med Name: metFORMIN HCl 500 MG Oral Tablet] 180 tablet 0    Sig: TAKE 1 TABLET BY MOUTH TWICE DAILY WITH A MEAL     Endocrinology:  Diabetes - Biguanides Passed - 03/05/2021  9:26 AM      Passed - Cr in normal range and within 360 days    Creatinine, Ser  Date Value Ref Range Status  01/02/2021 0.75 0.57 - 1.00 mg/dL Final          Passed - HBA1C is between 0 and 7.9 and within 180 days    HB A1C (BAYER DCA - WAIVED)  Date Value Ref Range Status  01/02/2021 7.5 (H) <7.0 % Final    Comment:  Diabetic Adult            <7.0                                       Healthy Adult        4.3 - 5.7                                                           (DCCT/NGSP) American Diabetes Association's Summary of Glycemic Recommendations for Adults with Diabetes: Hemoglobin A1c <7.0%. More stringent glycemic goals (A1c <6.0%) may further reduce complications at the cost of increased risk of hypoglycemia.           Passed - eGFR in normal  range and within 360 days    eGFR  Date Value Ref Range Status  01/02/2021 89 >59 mL/min/1.73 Final          Passed - Valid encounter within last 6 months    Recent Outpatient Visits           2 weeks ago Acute cystitis with hematuria   [place], NP   1 month ago Type 2 diabetes mellitus without complication, without long-term current use of insulin ([place])   [place], Lauren A, NP   3 months ago Painful urination   [facility] Vigg, Avanti, MD   4 months ago [place], Lauren A, NP   4 months ago Painful urination   [place], MD       Future Appointments             In 1 month Vigg, Avanti, MD [facility], [facility]

## 2021-03-05 NOTE — Telephone Encounter (Signed)
[place] called and spoke to [place], [facility] about the refill(s) Metformin requested. Advised it was sent on 02/19/21 #180/0 refill(s). He says it was received and picked up on 02/20/21, so disregard that request.

## 2021-03-06 DIAGNOSIS — G8929 Other chronic pain: Secondary | ICD-10-CM | POA: Diagnosis not present

## 2021-03-06 DIAGNOSIS — Z79899 Other long term (current) drug therapy: Secondary | ICD-10-CM | POA: Diagnosis not present

## 2021-03-06 DIAGNOSIS — Z79891 Long term (current) use of opiate analgesic: Secondary | ICD-10-CM | POA: Diagnosis not present

## 2021-03-06 DIAGNOSIS — M5416 Radiculopathy, lumbar region: Secondary | ICD-10-CM | POA: Diagnosis not present

## 2021-03-13 ENCOUNTER — Other Ambulatory Visit: Payer: Self-pay | Admitting: Internal Medicine

## 2021-03-13 NOTE — Telephone Encounter (Signed)
Requested Prescriptions  Pending Prescriptions Disp Refills  . losartan (COZAAR) 25 MG tablet [Pharmacy Med Name: Losartan Potassium 25 MG Oral Tablet] 90 tablet 1    Sig: Take 1 tablet by mouth once daily     Cardiovascular:  Angiotensin Receptor Blockers Passed - 03/13/2021  9:47 AM      Passed - Cr in normal range and within 180 days    Creatinine, Ser  Date Value Ref Range Status  01/02/2021 0.75 0.57 - 1.00 mg/dL Final         Passed - K in normal range and within 180 days    Potassium  Date Value Ref Range Status  01/02/2021 4.7 3.5 - 5.2 mmol/L Final         Passed - Patient is not pregnant      Passed - Last BP in normal range    BP Readings from Last 1 Encounters:  02/15/21 137/76         Passed - Valid encounter within last 6 months    Recent Outpatient Visits          3 weeks ago Acute cystitis with hematuria   [facility] Jon Billings, NP   2 months ago Type 2 diabetes mellitus without complication, without long-term current use of insulin ([place])   [place], Lauren A, NP   3 months ago Painful urination   [facility] Vigg, Avanti, MD   4 months ago [place], Lauren A, NP   4 months ago Painful urination   [place], MD      Future Appointments            In 1 month Vigg, Avanti, MD [facility], [facility]

## 2021-03-20 ENCOUNTER — Other Ambulatory Visit: Payer: Self-pay | Admitting: Internal Medicine

## 2021-03-20 NOTE — Telephone Encounter (Signed)
Requested medication (s) are due for refill today - no  Requested medication (s) are on the active medication list -yes  Future visit scheduled -yes  Last refill: 03/06/21 #30  Notes to clinic: Request RF: non delegated Rx  Requested Prescriptions  Pending Prescriptions Disp Refills   tiZANidine (ZANAFLEX) 4 MG tablet [Pharmacy Med Name: tiZANidine HCl 4 MG Oral Tablet] 30 tablet 0    Sig: TAKE 1 TABLET BY MOUTH AT BEDTIME . APPOINTMENT REQUIRED FOR FUTURE REFILLS     Not Delegated - Cardiovascular:  Alpha-2 Agonists - tizanidine Failed - 03/20/2021 11:18 AM      Failed - This refill cannot be delegated      Passed - Valid encounter within last 6 months    Recent Outpatient Visits           1 month ago Acute cystitis with hematuria   [place], NP   2 months ago Type 2 diabetes mellitus without complication, without long-term current use of insulin ([place])   [place], Lauren A, NP   3 months ago Painful urination   [facility] Vigg, Avanti, MD   4 months ago [place], Lauren A, NP   5 months ago Painful urination   [place], MD       Future Appointments             In 4 weeks Vigg, Avanti, MD [facility], [facility]               Requested Prescriptions  Pending Prescriptions Disp Refills   tiZANidine (ZANAFLEX) 4 MG tablet [Pharmacy Med Name: tiZANidine HCl 4 MG Oral Tablet] 30 tablet 0    Sig: TAKE 1 TABLET BY MOUTH AT BEDTIME . APPOINTMENT REQUIRED FOR FUTURE REFILLS     Not Delegated - Cardiovascular:  Alpha-2 Agonists - tizanidine Failed - 03/20/2021 11:18 AM      Failed - This refill cannot be delegated      Passed - Valid encounter within last 6 months    Recent Outpatient Visits           1 month ago Acute cystitis with hematuria   [place], NP   2 months ago Type 2 diabetes  mellitus without complication, without long-term current use of insulin ([place])   [place], Lauren A, NP   3 months ago Painful urination   [facility] Vigg, Avanti, MD   4 months ago [place], Lauren A, NP   5 months ago Painful urination   [place], MD       Future Appointments             In 4 weeks Vigg, Avanti, MD [facility], [facility]

## 2021-03-21 ENCOUNTER — Other Ambulatory Visit: Payer: Self-pay | Admitting: Internal Medicine

## 2021-03-21 NOTE — Telephone Encounter (Signed)
Requested Prescriptions  Refused Prescriptions Disp Refills  . tiZANidine (ZANAFLEX) 4 MG tablet 30 tablet 0     Not Delegated - Cardiovascular:  Alpha-2 Agonists - tizanidine Failed - 03/21/2021 10:32 AM      Failed - This refill cannot be delegated      Passed - Valid encounter within last 6 months    Recent Outpatient Visits          1 month ago Acute cystitis with hematuria   [place], NP   2 months ago Type 2 diabetes mellitus without complication, without long-term current use of insulin ([place])   [place], Lauren A, NP   3 months ago Painful urination   [facility] Vigg, Avanti, MD   4 months ago [place], Lauren A, NP   5 months ago Painful urination   [place], MD      Future Appointments            In 3 weeks Vigg, Avanti, MD [facility], [facility]

## 2021-03-21 NOTE — Telephone Encounter (Unsigned)
Copied from [place] [id number]. Topic: Quick Communication - Rx Refill/Question >> Mar 21, 2021 10:26 AM Tessa Lerner A wrote: Medication: tiZANidine (ZANAFLEX) 4 MG tablet [[id number]]  - patient has 0 (zero) tablets remaining   Has the patient contacted their pharmacy? Yes.  The patient's pharmacy said that they have contacted the practice multiple times and been unable to reach anyone.  (Agent: If no, request that the patient contact the pharmacy for the refill.) (Agent: If yes, when and what did the pharmacy advise?)  Preferred Pharmacy (with phone number or street name): [address] [place] [place] - [address]  Phone:  [phone] Fax:  [phone]   Has the patient been seen for an appointment in the last year OR does the patient have an upcoming appointment? Yes.    Agent: Please be advised that RX refills may take up to 3 business days. We ask that you follow-up with your pharmacy.

## 2021-04-02 DIAGNOSIS — E119 Type 2 diabetes mellitus without complications: Secondary | ICD-10-CM | POA: Diagnosis not present

## 2021-04-03 DIAGNOSIS — M5116 Intervertebral disc disorders with radiculopathy, lumbar region: Secondary | ICD-10-CM | POA: Diagnosis not present

## 2021-04-03 DIAGNOSIS — M25561 Pain in right knee: Secondary | ICD-10-CM | POA: Diagnosis not present

## 2021-04-03 DIAGNOSIS — Z79899 Other long term (current) drug therapy: Secondary | ICD-10-CM | POA: Diagnosis not present

## 2021-04-03 DIAGNOSIS — G894 Chronic pain syndrome: Secondary | ICD-10-CM | POA: Diagnosis not present

## 2021-04-04 ENCOUNTER — Other Ambulatory Visit: Payer: Self-pay | Admitting: Internal Medicine

## 2021-04-04 NOTE — Telephone Encounter (Signed)
Requested Prescriptions  Pending Prescriptions Disp Refills  . FARXIGA 5 MG TABS tablet [Pharmacy Med Name: Farxiga 5 MG Oral Tablet] 30 tablet 0    Sig: TAKE 1 TABLET BY MOUTH ONCE DAILY BEFORE BREAKFAST     Endocrinology:  Diabetes - SGLT2 Inhibitors Failed - 04/04/2021  2:24 PM      Failed - LDL in normal range and within 360 days    LDL Chol Calc (NIH)  Date Value Ref Range Status  01/02/2021 132 (H) 0 - 99 mg/dL Final         Passed - Cr in normal range and within 360 days    Creatinine, Ser  Date Value Ref Range Status  01/02/2021 0.75 0.57 - 1.00 mg/dL Final         Passed - HBA1C is between 0 and 7.9 and within 180 days    HB A1C (BAYER DCA - WAIVED)  Date Value Ref Range Status  01/02/2021 7.5 (H) <7.0 % Final    Comment:                                          Diabetic Adult            <7.0                                       Healthy Adult        4.3 - 5.7                                                           (DCCT/NGSP) American Diabetes Association's Summary of Glycemic Recommendations for Adults with Diabetes: Hemoglobin A1c <7.0%. More stringent glycemic goals (A1c <6.0%) may further reduce complications at the cost of increased risk of hypoglycemia.          Passed - eGFR in normal range and within 360 days    eGFR  Date Value Ref Range Status  01/02/2021 89 >59 mL/min/1.73 Final         Passed - Valid encounter within last 6 months    Recent Outpatient Visits          1 month ago Acute cystitis with hematuria   [place], NP   2 months ago Type 2 diabetes mellitus without complication, without long-term current use of insulin ([place])   [place], Lauren A, NP   4 months ago Painful urination   [facility] Vigg, Avanti, MD   5 months ago [place], Lauren A, NP   5 months ago Painful urination   [place], MD       Future Appointments            In 1 week Vigg, Avanti, MD [facility], [facility]           . ibuprofen (ADVIL) 600 MG tablet [Pharmacy Med Name: Ibuprofen 600 MG Oral Tablet] 30 tablet 0    Sig: Take 1 tablet by mouth twice daily as needed     Analgesics:  NSAIDS Passed -  04/04/2021  2:24 PM      Passed - Cr in normal range and within 360 days    Creatinine, Ser  Date Value Ref Range Status  01/02/2021 0.75 0.57 - 1.00 mg/dL Final         Passed - HGB in normal range and within 360 days    Hemoglobin  Date Value Ref Range Status  01/02/2021 14.0 11.1 - 15.9 g/dL Final         Passed - Patient is not pregnant      Passed - Valid encounter within last 12 months    Recent Outpatient Visits          1 month ago Acute cystitis with hematuria   [place], NP   2 months ago Type 2 diabetes mellitus without complication, without long-term current use of insulin ([place])   [place], Lauren A, NP   4 months ago Painful urination   [facility] Vigg, Avanti, MD   5 months ago [place], Lauren A, NP   5 months ago Painful urination   [place], MD      Future Appointments            In 1 week Vigg, Avanti, MD [facility]

## 2021-04-06 DIAGNOSIS — M1712 Unilateral primary osteoarthritis, left knee: Secondary | ICD-10-CM | POA: Diagnosis not present

## 2021-04-06 DIAGNOSIS — M25551 Pain in right hip: Secondary | ICD-10-CM | POA: Diagnosis not present

## 2021-04-10 ENCOUNTER — Other Ambulatory Visit: Payer: Self-pay

## 2021-04-10 ENCOUNTER — Other Ambulatory Visit: Payer: Medicare Other

## 2021-04-10 DIAGNOSIS — E119 Type 2 diabetes mellitus without complications: Secondary | ICD-10-CM | POA: Diagnosis not present

## 2021-04-10 DIAGNOSIS — E785 Hyperlipidemia, unspecified: Secondary | ICD-10-CM

## 2021-04-10 DIAGNOSIS — E1169 Type 2 diabetes mellitus with other specified complication: Secondary | ICD-10-CM

## 2021-04-10 LAB — BAYER DCA HB A1C WAIVED: HB A1C (BAYER DCA - WAIVED): 7.6 % — ABNORMAL HIGH (ref 4.8–5.6)

## 2021-04-11 LAB — LIPID PANEL W/O CHOL/HDL RATIO
Cholesterol, Total: 217 mg/dL — ABNORMAL HIGH (ref 100–199)
HDL: 47 mg/dL (ref >39)
LDL Chol Calc (NIH): 131 mg/dL — ABNORMAL HIGH (ref 0–99)
Triglycerides: 219 mg/dL — ABNORMAL HIGH (ref 0–149)
VLDL Cholesterol Cal: 39 mg/dL (ref 5–40)

## 2021-04-17 ENCOUNTER — Other Ambulatory Visit: Payer: Self-pay

## 2021-04-17 ENCOUNTER — Encounter: Payer: Self-pay | Admitting: Internal Medicine

## 2021-04-17 ENCOUNTER — Ambulatory Visit (INDEPENDENT_AMBULATORY_CARE_PROVIDER_SITE_OTHER): Payer: Medicare Other | Admitting: Internal Medicine

## 2021-04-17 VITALS — BP 137/80 | HR 58 | Temp 97.7°F | Ht 63.5 in | Wt 185.0 lb

## 2021-04-17 DIAGNOSIS — Z794 Long term (current) use of insulin: Secondary | ICD-10-CM

## 2021-04-17 DIAGNOSIS — E785 Hyperlipidemia, unspecified: Secondary | ICD-10-CM

## 2021-04-17 DIAGNOSIS — Z23 Encounter for immunization: Secondary | ICD-10-CM

## 2021-04-17 DIAGNOSIS — E1149 Type 2 diabetes mellitus with other diabetic neurological complication: Secondary | ICD-10-CM

## 2021-04-17 MED ORDER — PITAVASTATIN CALCIUM 1 MG PO TABS: 1.0000 mg | ORAL_TABLET | Freq: Every day | ORAL | 2 refills | Status: DC

## 2021-04-17 MED ORDER — DAPAGLIFLOZIN PROPANEDIOL 10 MG PO TABS: 10.0000 mg | ORAL_TABLET | Freq: Every day | ORAL | 3 refills | Status: DC

## 2021-04-17 MED ORDER — DAPAGLIFLOZIN PROPANEDIOL 5 MG PO TABS: 10.0000 mg | ORAL_TABLET | Freq: Every day | ORAL | 5 refills | Status: DC

## 2021-04-17 NOTE — Progress Notes (Signed)
BP 137/80   Pulse (!) 58   Temp 97.7 F (36.5 C) (Oral)   Ht 5' 3.5" (1.613 m)   Wt 185 lb (83.9 kg)   SpO2 100%   BMI 32.25 kg/m    Subjective:    Patient ID: Lisa Matthews, female    DOB: [birth year], 65 y.o.   MRN: [id number]  Chief Complaint  Patient presents with   Diabetes   Hyperlipidemia   Hypertension    HPI: Lisa Matthews is a 65 y.o. female  Pt has had recurrent falls nad has a ho Basal ganglia Infarct  Diabetes She presents for her follow-up diabetic visit. She has type 2 diabetes mellitus. Her disease course has been improving. Pertinent negatives for diabetes include no blurred vision, no chest pain, no fatigue, no foot paresthesias, no foot ulcerations, no polydipsia, no polyphagia, no polyuria, no weakness and no weight loss.  Hyperlipidemia This is a chronic (has been eating sausages and bacon for breakfast. and does eat red meat and fatty greasy food on a regular basis) problem. Pertinent negatives include no chest pain.  Hypertension This is a chronic problem. The current episode started more than 1 year ago. The problem is controlled. Pertinent negatives include no anxiety, blurred vision, chest pain, malaise/fatigue, neck pain or orthopnea.   Chief Complaint  Patient presents with   Diabetes   Hyperlipidemia   Hypertension    Relevant past medical, surgical, family and social history reviewed and updated as indicated. Interim medical history since our last visit reviewed. Allergies and medications reviewed and updated.  Review of Systems  Constitutional:  Negative for fatigue, malaise/fatigue and weight loss.  Eyes:  Negative for blurred vision.  Cardiovascular:  Negative for chest pain and orthopnea.  Endocrine: Negative for polydipsia, polyphagia and polyuria.  Musculoskeletal:  Negative for neck pain.  Neurological:  Negative for weakness.   Per HPI unless specifically indicated above     Objective:    BP 137/80   Pulse (!) 58    Temp 97.7 F (36.5 C) (Oral)   Ht 5' 3.5" (1.613 m)   Wt 185 lb (83.9 kg)   SpO2 100%   BMI 32.25 kg/m   Wt Readings from Last 3 Encounters:  04/17/21 185 lb (83.9 kg)  02/15/21 181 lb 6.4 oz (82.3 kg)  02/08/21 175 lb (79.4 kg)    Physical Exam Vitals and nursing note reviewed.  Constitutional:      General: She is not in acute distress.    Appearance: Normal appearance. She is not ill-appearing or diaphoretic.  Eyes:     Conjunctiva/sclera: Conjunctivae normal.  Pulmonary:     Breath sounds: No rhonchi.  Abdominal:     General: Abdomen is flat. Bowel sounds are normal. There is no distension.     Palpations: Abdomen is soft. There is no mass.     Tenderness: There is no abdominal tenderness. There is no guarding.  Skin:    General: Skin is warm and dry.     Coloration: Skin is not jaundiced.     Findings: No erythema.  Neurological:     Mental Status: She is alert.    Results for orders placed or performed in visit on 04/10/21  Lipid Panel w/o Chol/HDL Ratio  Result Value Ref Range   Cholesterol, Total 217 (H) 100 - 199 mg/dL   Triglycerides 219 (H) 0 - 149 mg/dL   HDL 47 >39 mg/dL   VLDL Cholesterol Cal 39 5 - 40 mg/dL  LDL Chol Calc (NIH) 131 (H) 0 - 99 mg/dL  Bayer DCA Hb A1c Waived  Result Value Ref Range   HB A1C (BAYER DCA - WAIVED) 7.6 (H) 4.8 - 5.6 %        Current Outpatient Medications:    ACCU-CHEK GUIDE test strip, USE TO CHECK BLOOD SUGAR 4 TIMES DAILY, Disp: , Rfl:    acetaminophen (TYLENOL) 650 MG CR tablet, Take by mouth as needed., Disp: , Rfl:    amLODipine (NORVASC) 2.5 MG tablet, Take 1 tablet by mouth once daily, Disp: 90 tablet, Rfl: 0   aspirin EC 81 MG tablet, Take 1 tablet (81 mg total) by mouth daily. Swallow whole., Disp: 90 tablet, Rfl: 3   atenolol (TENORMIN) 50 MG tablet, Take 1 tablet by mouth once daily, Disp: 90 tablet, Rfl: 0   Biotin w/ Vitamins C & E (HAIR/SKIN/NAILS PO), Take by mouth daily., Disp: , Rfl:    Blood Glucose  Monitoring Suppl (ACCU-CHEK GUIDE ME) w/Device KIT, CHECK BLOOD SUGAR 4 TIMES DAILY, Disp: , Rfl:    Calcium Carbonate-Vitamin D 600-400 MG-UNIT tablet, Take 1 tablet by mouth daily., Disp: , Rfl:    Cholecalciferol 25 MCG (1000 UT) tablet, Take 1,000 Units by mouth daily., Disp: , Rfl:    cyanocobalamin 1000 MCG tablet, Take 1,000 mcg by mouth daily., Disp: , Rfl:    diclofenac Sodium (VOLTAREN) 1 % GEL, Apply 2 g topically 4 (four) times daily., Disp: 100 g, Rfl: 1   ELDERBERRY PO, Take by mouth daily., Disp: , Rfl:    estradiol (ESTRACE) 0.1 MG/GM vaginal cream, Estrogen Cream Instruction Discard applicator Apply pea sized amount to tip of finger to urethra before bed. Wash hands well after application. Use Monday, Wednesday and Friday, Disp: 42.5 g, Rfl: 1   fenofibrate (TRICOR) 145 MG tablet, Take 1 tablet (145 mg total) by mouth daily., Disp: 30 tablet, Rfl: 6   gabapentin (NEURONTIN) 300 MG capsule, Take 1 capsule (300 mg total) by mouth 2 (two) times daily. (Patient taking differently: Take 300 mg by mouth 4 (four) times daily.), Disp: 60 capsule, Rfl: 3   ibuprofen (ADVIL) 600 MG tablet, Take 1 tablet by mouth twice daily as needed, Disp: 30 tablet, Rfl: 0   Lancets MISC, , Disp: , Rfl:    linaclotide (LINZESS) 145 MCG CAPS capsule, Take 1 capsule (145 mcg total) by mouth daily before breakfast., Disp: 90 capsule, Rfl: 1   losartan (COZAAR) 25 MG tablet, Take 1 tablet by mouth once daily, Disp: 90 tablet, Rfl: 1   meloxicam (MOBIC) 15 MG tablet, Take 1 tablet (15 mg total) by mouth daily., Disp: 30 tablet, Rfl: 0   metFORMIN (GLUCOPHAGE) 500 MG tablet, TAKE 1 TABLET BY MOUTH TWICE DAILY WITH A MEAL, Disp: 180 tablet, Rfl: 0   mirtazapine (REMERON) 30 MG tablet, Take 30 mg by mouth., Disp: , Rfl:    omeprazole (PRILOSEC) 40 MG capsule, Take 1 capsule by mouth once daily, Disp: 90 capsule, Rfl: 1   oxyCODONE (OXY IR/ROXICODONE) 5 MG immediate release tablet, Take 5 mg by mouth 3 (three) times  daily as needed., Disp: , Rfl:    Pitavastatin Calcium 1 MG TABS, Take 1 tablet (1 mg total) by mouth daily., Disp: 30 tablet, Rfl: 2   promethazine (PHENERGAN) 12.5 MG tablet, Take 1 tablet (12.5 mg total) by mouth every 8 (eight) hours as needed for nausea or vomiting., Disp: 20 tablet, Rfl: 0   Teriparatide, Recombinant, 620 MCG/2.48ML SOPN, Inject  into the skin at bedtime., Disp: , Rfl:    tiZANidine (ZANAFLEX) 4 MG tablet, TAKE 1 TABLET BY MOUTH AT BEDTIME . APPOINTMENT REQUIRED FOR FUTURE REFILLS, Disp: 30 tablet, Rfl: 0   venlafaxine XR (EFFEXOR-XR) 150 MG 24 hr capsule, Take 1 capsule (150 mg total) by mouth in the morning and at bedtime., Disp: 180 capsule, Rfl: 1   vitamin C (ASCORBIC ACID) 500 MG tablet, Take 500 mg by mouth daily., Disp: , Rfl:    zinc gluconate 50 MG tablet, Take 50 mg by mouth daily., Disp: , Rfl:    dapagliflozin propanediol (FARXIGA) 10 MG TABS tablet, Take 1 tablet (10 mg total) by mouth daily., Disp: 90 tablet, Rfl: 3    Assessment & Plan:   DM - IS ON METFORMIN AND FARXIGA INCREASE DOSE TO 10 MG.  A1c not well controlled goal is less than 6.5 patient has a history of a CVA needs to optimize A1c and LDL control   DM check HbA1c,  urine  microalbumin  diabetic diet plan given to pt  adviced regarding hypoglycemia and instructions given to pt today on how to prevent and treat the same if it were to occur. pt acknowledges the plan and voices understanding of the same.  exercise plan given and encouraged.   advice diabetic yearly podiatry, ophthalmology , nutritionist , dental check q 6 months  Ref. Range 09/25/2020 10:53 09/25/2020 10:57 01/02/2021 08:56 01/02/2021 09:01 04/10/2021 08:46  HB A1C (BAYER DCA - WAIVED) Latest Ref Range: 4.8 - 5.6 % 8.3 (H)  7.5 (H)  7.6 (H)    LOWER BACK PAIN   SEES PAIN MX for such @ [facility], 2 hrs from here. Stable, is on neurontin 300 mg 4 times a day   Depression to see , lives with her Boyfriend met through a dating app. Feels  better since she is in a relationship.  Psych fu in dec   Tinnitis sees ENT for such  Sees PT for such vestibular rehab  Ho CVA left basal ganglia lacunar infarct sees Dr. Manuella Ghazi, neurology last seen in august  Keeps falling and is unstable  for such is seeing PT for such to move her eyes and not head.   HLD  : To optimize LDL and triglycerides both levels high today Swill start on pitvastatin patient has allergies to other statins. Continue fienofibrate  recheck  FLP's    Ref. Range 04/10/2021 08:50  Cholesterol, Total Latest Ref Range: 100 - 1`99 mg/dL 217 (H)  HDL Cholesterol Latest Ref Range: >39 mg/dL 47  Triglycerides Latest Ref Range: 0 - 149 mg/dL 219 (H)  VLDL Cholesterol Cal Latest Ref Range: 5 - 40 mg/dL 39  LDL Chol Calc (NIH) Latest Ref Range: 0 - 99 mg/dL And triglycerides at 219 (H)   Problem List Items Addressed This Visit       Endocrine   Type 2 diabetes mellitus, with long-term current use of insulin (HCC)   Relevant Medications   dapagliflozin propanediol (FARXIGA) 10 MG TABS tablet   Pitavastatin Calcium 1 MG TABS   Other Relevant Orders   CBC with Differential/Platelet   Comprehensive metabolic panel   Lipid panel   TSH   Urinalysis, Routine w reflex microscopic     Other   Hyperlipidemia   Relevant Medications   Pitavastatin Calcium 1 MG TABS   Other Relevant Orders   CBC with Differential/Platelet   Comprehensive metabolic panel   Lipid panel   TSH   Urinalysis,  Routine w reflex microscopic   Need for influenza vaccination - Primary   Relevant Orders   Flu Vaccine QUAD 53moIM (Fluarix, Fluzone & Alfiuria Quad PF) (Completed)     Orders Placed This Encounter  Procedures   Flu Vaccine QUAD 637moM (Fluarix, Fluzone & Alfiuria Quad PF)   CBC with Differential/Platelet   Comprehensive metabolic panel   Lipid panel   TSH   Urinalysis, Routine w reflex microscopic     Meds ordered this encounter  Medications   DISCONTD: dapagliflozin  propanediol (FARXIGA) 5 MG TABS tablet    Sig: Take 2 tablets (10 mg total) by mouth daily.    Dispense:  90 tablet    Refill:  5   dapagliflozin propanediol (FARXIGA) 10 MG TABS tablet    Sig: Take 1 tablet (10 mg total) by mouth daily.    Dispense:  90 tablet    Refill:  3   Pitavastatin Calcium 1 MG TABS    Sig: Take 1 tablet (1 mg total) by mouth daily.    Dispense:  30 tablet    Refill:  2     Follow up plan: Return in about 4 weeks (around 05/15/2021).

## 2021-04-17 NOTE — Patient Instructions (Signed)

## 2021-04-18 ENCOUNTER — Telehealth: Payer: Self-pay

## 2021-04-18 NOTE — Telephone Encounter (Signed)
Spoke with patient and informed her that her signed parking placard was at the front dest waiting on pick up. Patient verbalized understanding

## 2021-04-20 DIAGNOSIS — G43719 Chronic migraine without aura, intractable, without status migrainosus: Secondary | ICD-10-CM | POA: Diagnosis not present

## 2021-04-20 DIAGNOSIS — H9313 Tinnitus, bilateral: Secondary | ICD-10-CM | POA: Diagnosis not present

## 2021-04-20 DIAGNOSIS — R42 Dizziness and giddiness: Secondary | ICD-10-CM | POA: Diagnosis not present

## 2021-04-20 DIAGNOSIS — Z8673 Personal history of transient ischemic attack (TIA), and cerebral infarction without residual deficits: Secondary | ICD-10-CM | POA: Diagnosis not present

## 2021-04-23 ENCOUNTER — Other Ambulatory Visit: Payer: Self-pay | Admitting: Advanced Practice Midwife

## 2021-04-23 ENCOUNTER — Other Ambulatory Visit (HOSPITAL_COMMUNITY): Payer: Self-pay | Admitting: Advanced Practice Midwife

## 2021-04-23 DIAGNOSIS — G43719 Chronic migraine without aura, intractable, without status migrainosus: Secondary | ICD-10-CM

## 2021-04-30 ENCOUNTER — Encounter: Payer: Self-pay | Admitting: Pharmacist

## 2021-04-30 NOTE — Progress Notes (Signed)
[facility])                                            [place] Team                                        Statin Quality Measure Assessment    04/30/2021  Lisa Matthews [birth year] [id number]   Per review of chart and payor information, patient has a diagnosis of diabetes but is not currently filling a statin prescription.  This places patient into the SUPD (Statin Use In Patients with Diabetes) measure for CMS.    Patient has documented trials of atorvastatin, pravastatin, and simvastatin with reported intolerances, but no corresponding CPT codes that would exclude patient from SUPD measure. NOTE: Patient has not filled pitavastatin per pharmacy claims  The 10-year ASCVD risk score (Arnett DK, et al., 2019) is: 8.7%   Values used to calculate the score:     Age: 65 years     Sex: Female     Is Non-Hispanic African American: No     Diabetic: Yes     Tobacco smoker: No     Systolic Blood Pressure: 99 mmHg     Is BP treated: Yes     HDL Cholesterol: 47 mg/dL     Total Cholesterol: 217 mg/dL 04/10/2021     Component Value Date/Time   CHOL 217 (H) 04/10/2021 0850   TRIG 219 (H) 04/10/2021 0850   HDL 47 04/10/2021 0850   CHOLHDL 4.7 (H) 01/02/2021 0901   LDLCALC 131 (H) 04/10/2021 0850    Please consider ONE of the following recommendations:  Initiate high intensity statin Atorvastatin 40mg  once daily, #90, 3 refills   Rosuvastatin 20mg  once daily, #90, 3 refills    Initiate moderate intensity          statin with reduced frequency if prior          statin intolerance 1x weekly, #13, 3 refills   2x weekly, #26, 3 refills   3x weekly, #39, 3 refills    Code for past statin intolerance or  other exclusions (required annually)  Provider Requirements: Associate code during an office visit or telehealth encounter  Drug Induced Myopathy G72.0   Myopathy, unspecified G72.9   Myositis, unspecified M60.9    Rhabdomyolysis M62.82   Cirrhosis of liver K74.69   Prediabetes R73.03   PCOS E28.2   Adverse effect of antihyperlipidemic and antiarteriosclerotic drugs, initial encounter [place], PharmD [place] Pharmacist Office: [phone]

## 2021-05-01 DIAGNOSIS — G8929 Other chronic pain: Secondary | ICD-10-CM | POA: Diagnosis not present

## 2021-05-01 DIAGNOSIS — Z79899 Other long term (current) drug therapy: Secondary | ICD-10-CM | POA: Diagnosis not present

## 2021-05-01 DIAGNOSIS — M5416 Radiculopathy, lumbar region: Secondary | ICD-10-CM | POA: Diagnosis not present

## 2021-05-01 DIAGNOSIS — E119 Type 2 diabetes mellitus without complications: Secondary | ICD-10-CM | POA: Diagnosis not present

## 2021-05-02 ENCOUNTER — Ambulatory Visit (INDEPENDENT_AMBULATORY_CARE_PROVIDER_SITE_OTHER): Payer: Medicare Other | Admitting: Internal Medicine

## 2021-05-02 ENCOUNTER — Other Ambulatory Visit: Payer: Self-pay

## 2021-05-02 ENCOUNTER — Encounter: Payer: Self-pay | Admitting: Internal Medicine

## 2021-05-02 VITALS — BP 125/80 | HR 61 | Temp 97.5°F | Ht 63.39 in | Wt 184.8 lb

## 2021-05-02 DIAGNOSIS — Z9049 Acquired absence of other specified parts of digestive tract: Secondary | ICD-10-CM | POA: Insufficient documentation

## 2021-05-02 DIAGNOSIS — R1013 Epigastric pain: Secondary | ICD-10-CM

## 2021-05-02 MED ORDER — METFORMIN HCL 500 MG PO TABS: 500.0000 mg | ORAL_TABLET | Freq: Three times a day (TID) | ORAL | 3 refills | Status: DC

## 2021-05-02 NOTE — Progress Notes (Signed)
BP 125/80   Pulse 61   Temp (!) 97.5 F (36.4 C) (Oral)   Ht 5' 3.39" (1.61 m)   Wt 184 lb 12.8 oz (83.8 kg)   SpO2 97%   BMI 32.34 kg/m    Subjective:    Patient ID: Lisa Matthews, female    DOB: September 26, 1955, 65 y.o.   MRN: [id number]  Chief Complaint  Patient presents with   Referral    Patient was referral to GI because her Gall bladder is acting up and she does not have a gall baldder.     HPI: Lisa Matthews is a 65 y.o. female  Abdominal Pain This is a recurrent (has had cholecystectomy moved here form [place] where her surgey was performed.) problem. Episode onset: has severe pain was a 9 /10 yesterday , pain down to 4 now. The problem has been unchanged. The pain is located in the epigastric region. The pain is at a severity of 4/10. The abdominal pain does not radiate. Pertinent negatives include no anorexia, arthralgias, belching, constipation, diarrhea, fever, flatus, frequency, headaches, hematochezia, hematuria, melena, myalgias, nausea, vomiting or weight loss.   Chief Complaint  Patient presents with   Referral    Patient was referral to GI because her Gall bladder is acting up and she does not have a gall baldder.     Relevant past medical, surgical, family and social history reviewed and updated as indicated. Interim medical history since our last visit reviewed. Allergies and medications reviewed and updated.  Review of Systems  Constitutional:  Negative for fever and weight loss.  Gastrointestinal:  Positive for abdominal pain. Negative for anorexia, constipation, diarrhea, flatus, hematochezia, melena, nausea and vomiting.  Genitourinary:  Negative for frequency and hematuria.  Musculoskeletal:  Negative for arthralgias and myalgias.  Neurological:  Negative for headaches.   Per HPI unless specifically indicated above     Objective:    BP 125/80   Pulse 61   Temp (!) 97.5 F (36.4 C) (Oral)   Ht 5' 3.39" (1.61 m)   Wt 184 lb 12.8 oz (83.8 kg)    SpO2 97%   BMI 32.34 kg/m   Wt Readings from Last 3 Encounters:  05/02/21 184 lb 12.8 oz (83.8 kg)  04/17/21 185 lb (83.9 kg)  02/15/21 181 lb 6.4 oz (82.3 kg)    Physical Exam Vitals and nursing note reviewed.  Constitutional:      General: She is not in acute distress.    Appearance: Normal appearance. She is not ill-appearing or diaphoretic.  Eyes:     Conjunctiva/sclera: Conjunctivae normal.  Pulmonary:     Breath sounds: No rhonchi.  Abdominal:     General: Abdomen is flat. Bowel sounds are normal. There is no distension.     Palpations: Abdomen is soft. There is no mass.     Tenderness: There is no abdominal tenderness. There is no guarding.  Skin:    General: Skin is warm and dry.     Coloration: Skin is not jaundiced.     Findings: No erythema.  Neurological:     Mental Status: She is alert.    Results for orders placed or performed in visit on 04/10/21  Lipid Panel w/o Chol/HDL Ratio  Result Value Ref Range   Cholesterol, Total 217 (H) 100 - 199 mg/dL   Triglycerides 219 (H) 0 - 149 mg/dL   HDL 47 >39 mg/dL   VLDL Cholesterol Cal 39 5 - 40 mg/dL   LDL Chol Calc (  NIH) 131 (H) 0 - 99 mg/dL  Bayer DCA Hb A1c Waived  Result Value Ref Range   HB A1C (BAYER DCA - WAIVED) 7.6 (H) 4.8 - 5.6 %        Current Outpatient Medications:    ACCU-CHEK GUIDE test strip, USE TO CHECK BLOOD SUGAR 4 TIMES DAILY, Disp: , Rfl:    acetaminophen (TYLENOL) 650 MG CR tablet, Take by mouth as needed., Disp: , Rfl:    amLODipine (NORVASC) 2.5 MG tablet, Take 1 tablet by mouth once daily, Disp: 90 tablet, Rfl: 0   aspirin EC 81 MG tablet, Take 1 tablet (81 mg total) by mouth daily. Swallow whole., Disp: 90 tablet, Rfl: 3   atenolol (TENORMIN) 50 MG tablet, Take 1 tablet by mouth once daily, Disp: 90 tablet, Rfl: 0   Biotin w/ Vitamins C & E (HAIR/SKIN/NAILS PO), Take by mouth daily., Disp: , Rfl:    Blood Glucose Monitoring Suppl (ACCU-CHEK GUIDE ME) w/Device KIT, CHECK BLOOD SUGAR 4  TIMES DAILY, Disp: , Rfl:    Calcium Carbonate-Vitamin D 600-400 MG-UNIT tablet, Take 1 tablet by mouth daily., Disp: , Rfl:    Cholecalciferol 25 MCG (1000 UT) tablet, Take 1,000 Units by mouth daily., Disp: , Rfl:    cyanocobalamin 1000 MCG tablet, Take 1,000 mcg by mouth daily., Disp: , Rfl:    dapagliflozin propanediol (FARXIGA) 10 MG TABS tablet, Take 1 tablet (10 mg total) by mouth daily., Disp: 90 tablet, Rfl: 3   diclofenac Sodium (VOLTAREN) 1 % GEL, Apply 2 g topically 4 (four) times daily., Disp: 100 g, Rfl: 1   ELDERBERRY PO, Take by mouth daily., Disp: , Rfl:    estradiol (ESTRACE) 0.1 MG/GM vaginal cream, Estrogen Cream Instruction Discard applicator Apply pea sized amount to tip of finger to urethra before bed. Wash hands well after application. Use Monday, Wednesday and Friday, Disp: 42.5 g, Rfl: 1   fenofibrate (TRICOR) 145 MG tablet, Take 1 tablet (145 mg total) by mouth daily., Disp: 30 tablet, Rfl: 6   gabapentin (NEURONTIN) 300 MG capsule, Take 1 capsule (300 mg total) by mouth 2 (two) times daily. (Patient taking differently: Take 300 mg by mouth 4 (four) times daily.), Disp: 60 capsule, Rfl: 3   ibuprofen (ADVIL) 600 MG tablet, Take 1 tablet by mouth twice daily as needed, Disp: 30 tablet, Rfl: 0   Lancets MISC, , Disp: , Rfl:    linaclotide (LINZESS) 145 MCG CAPS capsule, Take 1 capsule (145 mcg total) by mouth daily before breakfast., Disp: 90 capsule, Rfl: 1   losartan (COZAAR) 25 MG tablet, Take 1 tablet by mouth once daily, Disp: 90 tablet, Rfl: 1   meloxicam (MOBIC) 15 MG tablet, Take 1 tablet (15 mg total) by mouth daily., Disp: 30 tablet, Rfl: 0   metFORMIN (GLUCOPHAGE) 500 MG tablet, TAKE 1 TABLET BY MOUTH TWICE DAILY WITH A MEAL, Disp: 180 tablet, Rfl: 0   mirtazapine (REMERON) 30 MG tablet, Take 30 mg by mouth., Disp: , Rfl:    omeprazole (PRILOSEC) 40 MG capsule, Take 1 capsule by mouth once daily, Disp: 90 capsule, Rfl: 1   oxyCODONE (OXY IR/ROXICODONE) 5 MG  immediate release tablet, Take 5 mg by mouth 3 (three) times daily as needed., Disp: , Rfl:    Pitavastatin Calcium 1 MG TABS, Take 1 tablet (1 mg total) by mouth daily., Disp: 30 tablet, Rfl: 2   promethazine (PHENERGAN) 12.5 MG tablet, Take 1 tablet (12.5 mg total) by mouth every 8 (eight)  hours as needed for nausea or vomiting., Disp: 20 tablet, Rfl: 0   Teriparatide, Recombinant, 620 MCG/2.48ML SOPN, Inject into the skin at bedtime., Disp: , Rfl:    tiZANidine (ZANAFLEX) 4 MG tablet, TAKE 1 TABLET BY MOUTH AT BEDTIME . APPOINTMENT REQUIRED FOR FUTURE REFILLS, Disp: 30 tablet, Rfl: 0   venlafaxine XR (EFFEXOR-XR) 150 MG 24 hr capsule, Take 1 capsule (150 mg total) by mouth in the morning and at bedtime., Disp: 180 capsule, Rfl: 1   vitamin C (ASCORBIC ACID) 500 MG tablet, Take 500 mg by mouth daily., Disp: , Rfl:    zinc gluconate 50 MG tablet, Take 50 mg by mouth daily., Disp: , Rfl:     Assessment & Plan:  Abdominal pain  Will refer to GI  Is on oxycodone through pain mx that helps with abdominal pain  Advised not to take these but seek medical help @ ER if recurs  Pt has had nuasea took phenergan for such    2. DM a1c is at 7.6 Needs to increase water intake  Is on farxiga increase metfromin 500 mg tid.  check HbA1c,  urine  microalbumin  diabetic diet plan given to pt  adviced regarding hypoglycemia and instructions given to pt today on how to prevent and treat the same if it were to occur. pt acknowledges the plan and voices understanding of the same.  exercise plan given and encouraged.   advice diabetic yearly podiatry, ophthalmology , nutritionist , dental check q 6 months,  Problem List Items Addressed This Visit   None    Orders Placed This Encounter  Procedures   Ambulatory referral to Gastroenterology     Meds ordered this encounter  Medications   metFORMIN (GLUCOPHAGE) 500 MG tablet    Sig: Take 1 tablet (500 mg total) by mouth in the morning, at noon, and at  bedtime.    Dispense:  90 tablet    Refill:  3     Follow up plan: No follow-ups on file.

## 2021-05-03 DIAGNOSIS — E119 Type 2 diabetes mellitus without complications: Secondary | ICD-10-CM | POA: Diagnosis not present

## 2021-05-03 DIAGNOSIS — R42 Dizziness and giddiness: Secondary | ICD-10-CM | POA: Diagnosis not present

## 2021-05-05 DIAGNOSIS — Z87891 Personal history of nicotine dependence: Secondary | ICD-10-CM | POA: Diagnosis not present

## 2021-05-05 DIAGNOSIS — R001 Bradycardia, unspecified: Secondary | ICD-10-CM | POA: Diagnosis not present

## 2021-05-05 DIAGNOSIS — G4485 Primary stabbing headache: Secondary | ICD-10-CM | POA: Diagnosis not present

## 2021-05-05 DIAGNOSIS — N200 Calculus of kidney: Secondary | ICD-10-CM | POA: Diagnosis not present

## 2021-05-05 DIAGNOSIS — R42 Dizziness and giddiness: Secondary | ICD-10-CM | POA: Diagnosis not present

## 2021-05-05 DIAGNOSIS — R1013 Epigastric pain: Secondary | ICD-10-CM | POA: Diagnosis not present

## 2021-05-05 DIAGNOSIS — I700 Atherosclerosis of aorta: Secondary | ICD-10-CM | POA: Diagnosis not present

## 2021-05-05 DIAGNOSIS — Z9049 Acquired absence of other specified parts of digestive tract: Secondary | ICD-10-CM | POA: Diagnosis not present

## 2021-05-05 DIAGNOSIS — R110 Nausea: Secondary | ICD-10-CM | POA: Diagnosis not present

## 2021-05-05 DIAGNOSIS — R079 Chest pain, unspecified: Secondary | ICD-10-CM | POA: Diagnosis not present

## 2021-05-05 DIAGNOSIS — K760 Fatty (change of) liver, not elsewhere classified: Secondary | ICD-10-CM | POA: Diagnosis not present

## 2021-05-07 ENCOUNTER — Other Ambulatory Visit: Payer: Medicare Other

## 2021-05-08 ENCOUNTER — Ambulatory Visit
Admission: RE | Admit: 2021-05-08 | Discharge: 2021-05-08 | Disposition: A | Discharge status: Home / Self Care | Payer: Medicare Other | Source: Ambulatory Visit | Attending: Neurology | Admitting: Neurology

## 2021-05-08 ENCOUNTER — Other Ambulatory Visit: Payer: Self-pay

## 2021-05-08 DIAGNOSIS — G43719 Chronic migraine without aura, intractable, without status migrainosus: Secondary | ICD-10-CM | POA: Diagnosis not present

## 2021-05-08 DIAGNOSIS — R519 Headache, unspecified: Secondary | ICD-10-CM | POA: Diagnosis not present

## 2021-05-08 IMAGING — MR MR HEAD W/O CM
13 series · 48 of 48 positions shown · non-contrast
Comparison: Brain MRI [DATE]

CLINICAL DATA: Provided history: Intractable chronic migraine
without WEIDMAN and without status migrainosus. Additional history
provided: Patient reports ringing in bilateral ears since [REDACTED],
frontal headache, vision changes.

EXAM:
MRI HEAD WITHOUT CONTRAST
TECHNIQUE: Multiplanar, multiecho pulse sequences of the brain and surrounding
structures were obtained without intravenous contrast.

[Series 5: ax dwi_tracew · axial · 3.0mm · 0.65mm/px · z∈[-103,+51]mm · 2 of 48 slices shown]
[im 1/48]
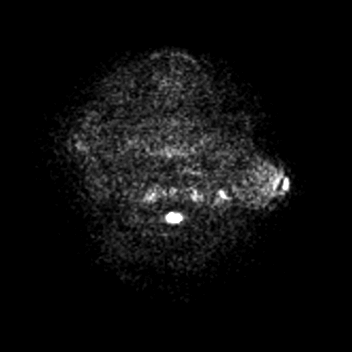
[im 48/48]
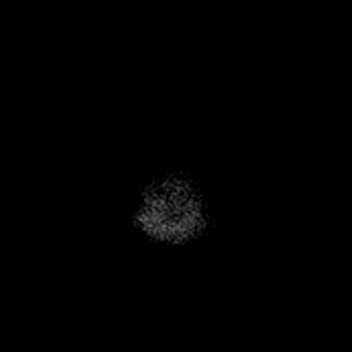

[Series 6: ax dwi_adc · axial · 3.0mm · 0.65mm/px · z∈[-103,+44]mm · 2 of 46 slices shown]
[im 1/46]
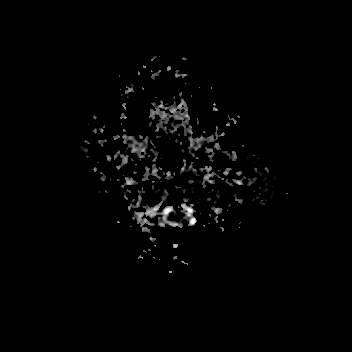
[im 46/46]
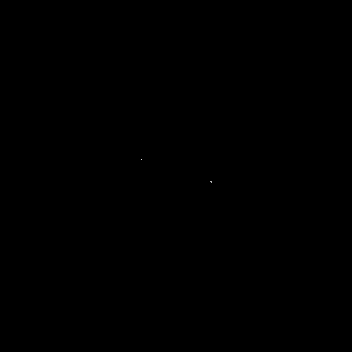

[Series 7: cor dwi_tracew · coronal · 5.0mm · 1.80mm/px · 3 of 36 slices shown]
[im 1/36]
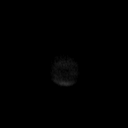
[im 18/36]
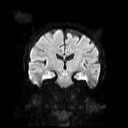
[im 36/36]
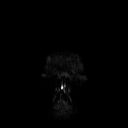

[Series 8: cor dwi_adc · coronal · 5.0mm · 1.80mm/px · 3 of 36 slices shown]
[im 1/36]
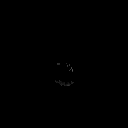
[im 18/36]
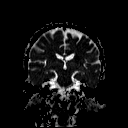
[im 36/36]
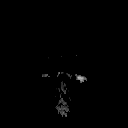

[Series 9: T1 · sagittal · 5.0mm · 0.62mm/px · 2 of 21 slices shown (1 of 2)]
[im 1/21]
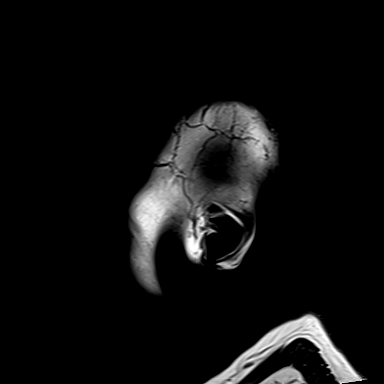
[im 21/21]
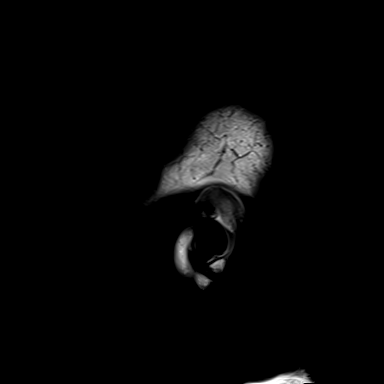

[Series 10: T2 · axial · 5.0mm · 0.53mm/px · z∈[-96,+46]mm · 2 of 25 slices shown (1 of 2)]
[im 1/25]
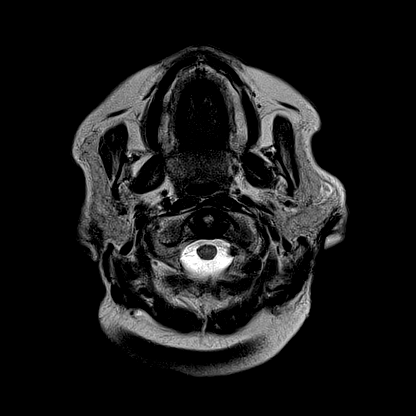
[im 25/25]
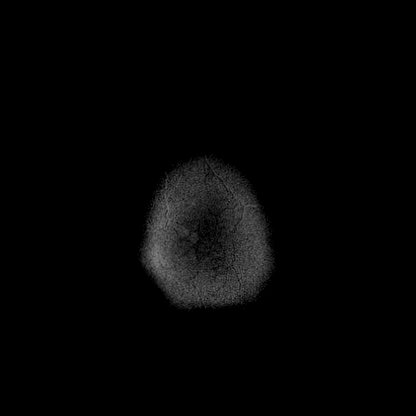

[Series 11: mag_images · axial · 3.0mm · 0.90mm/px · z∈[-100,+51]mm · 4 of 52 slices shown]
[im 1/52]
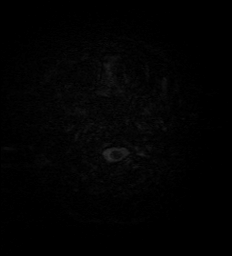
[im 18/52]
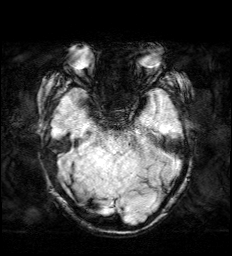
[im 35/52]
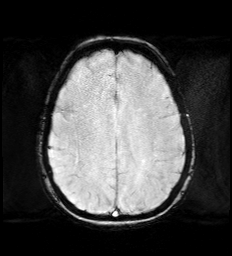
[im 52/52]
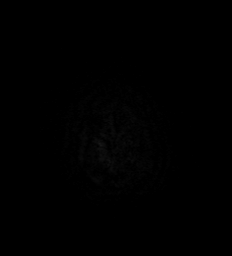

[Series 12: pha_images · axial · 3.0mm · 0.90mm/px · z∈[-97,+51]mm · 4 of 51 slices shown]
[im 1/51]
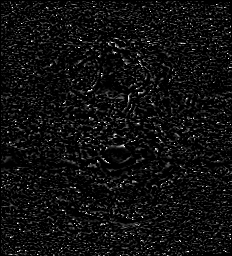
[im 17/51]
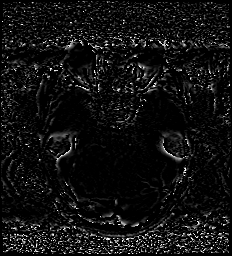
[im 34/51]
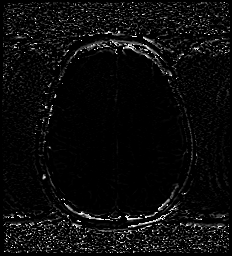
[im 51/51]
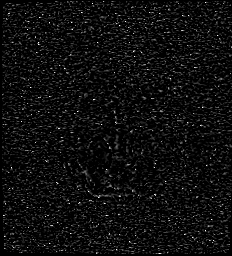

[Series 13: swi_images · axial · 3.0mm · 0.90mm/px · z∈[-100,+51]mm · 4 of 52 slices shown]
[im 1/52]
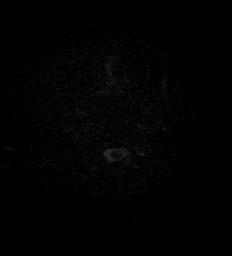
[im 18/52]
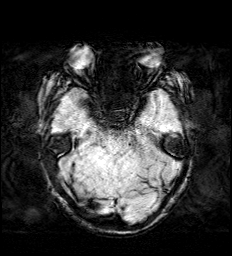
[im 35/52]
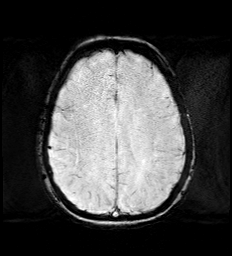
[im 52/52]
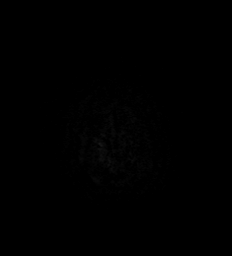

[Series 14: mip_images(sw) · axial · 24.0mm · 0.90mm/px · z∈[-89,+41]mm · 3 of 45 slices shown]
[im 1/45]
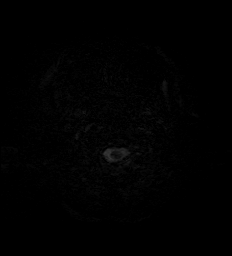
[im 23/45]
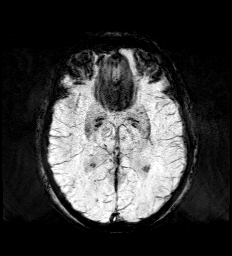
[im 45/45]
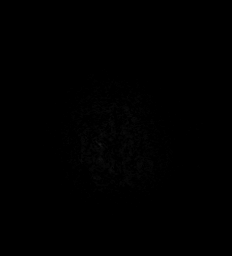

[Series 15: FLAIR · axial · 3.0mm · 0.53mm/px · z∈[-105,+55]mm · 4 of 55 slices shown]
[im 1/55]
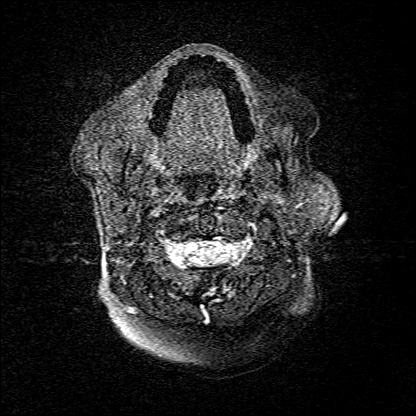
[im 19/55]
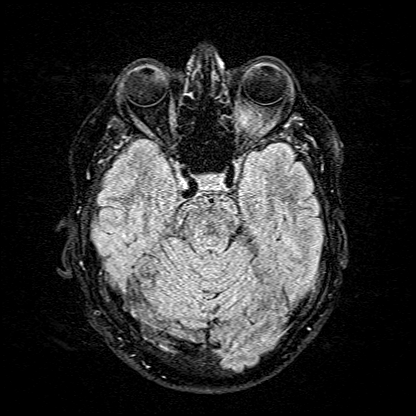
[im 37/55]
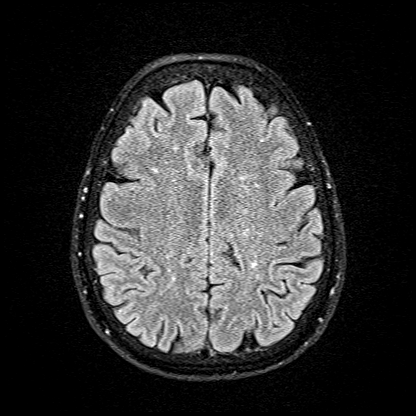
[im 55/55]
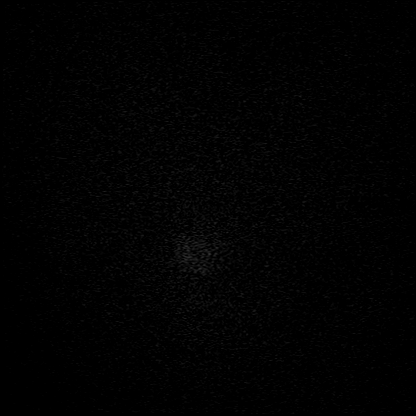

[Series 16: T1 · axial · 1.0mm · 0.98mm/px · z∈[-109,+64]mm · 13 of 176 slices shown (2 of 2)]
[im 1/176]
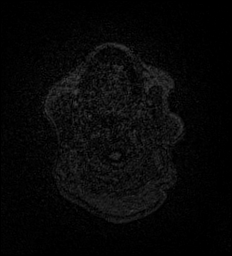
[im 15/176]
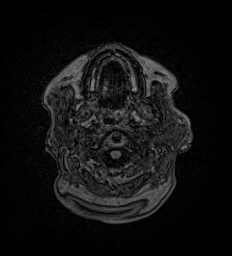
[im 30/176]
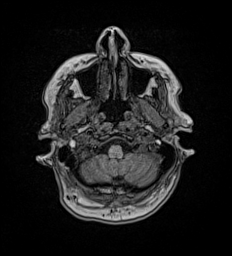
[im 44/176]
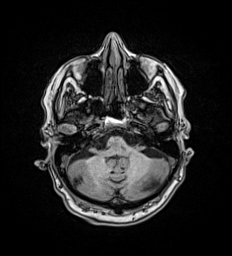
[im 59/176]
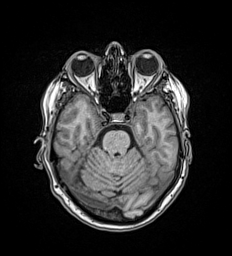
[im 73/176]
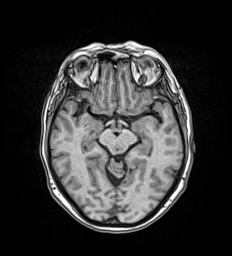
[im 88/176]
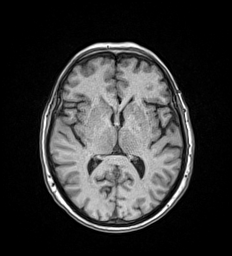
[im 103/176]
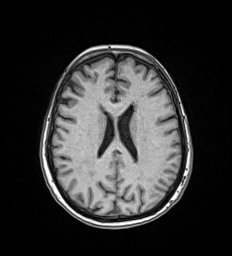
[im 117/176]
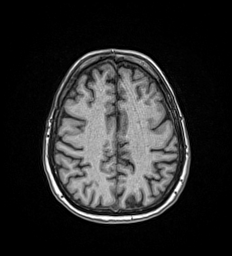
[im 132/176]
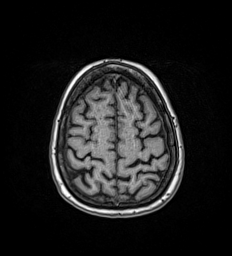
[im 146/176]
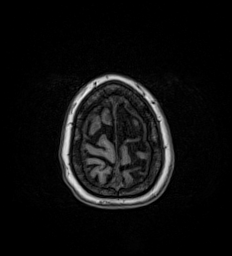
[im 161/176]
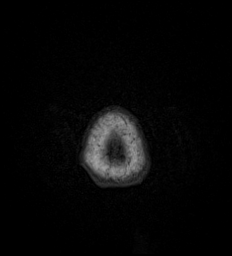
[im 176/176]
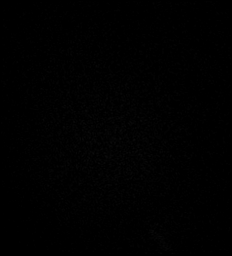

[Series 17: T2 · coronal · 5.0mm · 0.57mm/px · 2 of 29 slices shown (2 of 2)]
[im 1/29]
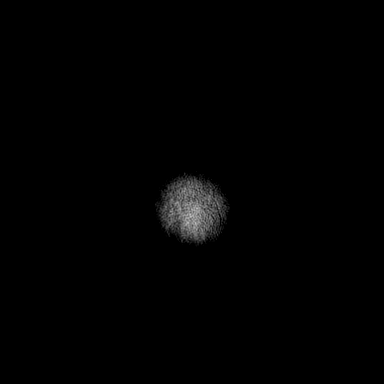
[im 29/29]
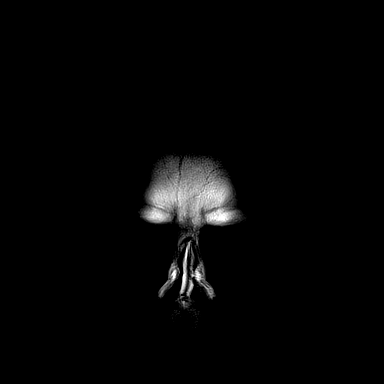

[48 of 48 positions shown; findings below may reference images not displayed]

FINDINGS: Brain:

Cerebral volume is normal for age.

Mild-to-moderate for age multifocal T2 FLAIR hyperintense signal
abnormality within the cerebral white matter, nonspecific but
compatible with chronic small vessel ischemic disease.

Redemonstrated small chronic lacunar infarct within the left
lentiform nucleus.

There is no acute infarct.

No evidence of an intracranial mass.

No chronic intracranial blood products.

No extra-axial fluid collection.

No midline shift.

Vascular: Maintained flow voids within the proximal large arterial
vessels.

Skull and upper cervical spine: No focal suspicious marrow lesion.

Sinuses/Orbits: Visualized orbits show no acute finding. Trace
mucosal thickening within the left sphenoid sinus.
IMPRESSION: No evidence of acute intracranial abnormality.

Stable non-contrast MRI appearance of the brain as compared to
[DATE].

Small chronic lacunar infarct within the left basal ganglia.

Mild-to-moderate chronic small vessel ischemic changes within the
cerebral white matter.

## 2021-05-09 DIAGNOSIS — R42 Dizziness and giddiness: Secondary | ICD-10-CM | POA: Diagnosis not present

## 2021-05-10 ENCOUNTER — Other Ambulatory Visit: Payer: Medicare Other

## 2021-05-10 DIAGNOSIS — Z86010 Personal history of colonic polyps: Secondary | ICD-10-CM | POA: Diagnosis not present

## 2021-05-10 DIAGNOSIS — K760 Fatty (change of) liver, not elsewhere classified: Secondary | ICD-10-CM | POA: Diagnosis not present

## 2021-05-10 DIAGNOSIS — R110 Nausea: Secondary | ICD-10-CM | POA: Diagnosis not present

## 2021-05-10 DIAGNOSIS — R1012 Left upper quadrant pain: Secondary | ICD-10-CM | POA: Diagnosis not present

## 2021-05-10 DIAGNOSIS — R1013 Epigastric pain: Secondary | ICD-10-CM | POA: Diagnosis not present

## 2021-05-11 DIAGNOSIS — R110 Nausea: Secondary | ICD-10-CM | POA: Insufficient documentation

## 2021-05-11 DIAGNOSIS — Z86010 Personal history of colonic polyps: Secondary | ICD-10-CM | POA: Insufficient documentation

## 2021-05-16 ENCOUNTER — Other Ambulatory Visit: Payer: Self-pay | Admitting: Internal Medicine

## 2021-05-16 DIAGNOSIS — R42 Dizziness and giddiness: Secondary | ICD-10-CM | POA: Diagnosis not present

## 2021-05-16 NOTE — Telephone Encounter (Signed)
Requested medication (s) are due for refill today: Yes  Requested medication (s) are on the active medication list: Yes  Last refill:  03/21/21 #30/0 RF  Future visit scheduled: yes  Notes to clinic:  Unable to refill per protocol, cannot delegate.      Requested Prescriptions  Pending Prescriptions Disp Refills   tiZANidine (ZANAFLEX) 4 MG tablet [Pharmacy Med Name: tiZANidine HCl 4 MG Oral Tablet] 30 tablet 0    Sig: TAKE 1 TABLET BY MOUTH AT BEDTIME . APPOINTMENT REQUIRED FOR FUTURE REFILLS     Not Delegated - Cardiovascular:  Alpha-2 Agonists - tizanidine Failed - 05/16/2021 10:53 AM      Failed - This refill cannot be delegated      Passed - Valid encounter within last 6 months    Recent Outpatient Visits           2 weeks ago Epigastric pain   [facility] Vigg, Avanti, MD   4 weeks ago Need for influenza vaccination   [facility] Vigg, Avanti, MD   3 months ago Acute cystitis with hematuria   [facility] Jon Billings, NP   4 months ago Type 2 diabetes mellitus without complication, without long-term current use of insulin ([place])   Crumpler, Lauren A, NP   5 months ago Painful urination   [facility] Vigg, Avanti, MD       Future Appointments             Tomorrow Vigg, Loman [place], MD [facility], [place]

## 2021-05-17 ENCOUNTER — Ambulatory Visit (INDEPENDENT_AMBULATORY_CARE_PROVIDER_SITE_OTHER): Payer: Medicare Other | Admitting: Internal Medicine

## 2021-05-17 ENCOUNTER — Encounter: Payer: Self-pay | Admitting: Internal Medicine

## 2021-05-17 ENCOUNTER — Other Ambulatory Visit: Payer: Self-pay

## 2021-05-17 VITALS — BP 134/84 | HR 54 | Temp 98.0°F | Ht 63.39 in | Wt 188.5 lb

## 2021-05-17 DIAGNOSIS — Z23 Encounter for immunization: Secondary | ICD-10-CM

## 2021-05-17 DIAGNOSIS — E119 Type 2 diabetes mellitus without complications: Secondary | ICD-10-CM | POA: Insufficient documentation

## 2021-05-17 MED ORDER — FENOFIBRATE 145 MG PO TABS
145.0000 mg | ORAL_TABLET | Freq: Every day | ORAL | 6 refills | Status: DC
Start: 2021-05-17 — End: 2021-07-20

## 2021-05-17 NOTE — Progress Notes (Signed)
BP 134/84    Pulse (!) 54    Temp 98 F (36.7 C) (Oral)    Ht 5' 3.39" (1.61 m)    Wt 188 lb 8 oz (85.5 kg)    SpO2 98%    BMI 32.99 kg/m    Subjective:    Patient ID: Lisa Matthews, female    DOB: April 02, 1956, 65 y.o.   MRN: [id number]  Chief Complaint  Patient presents with   Diabetes   Flank Pain    Patient states she is having pain under her ribs on her left side still   Medication Problem    Patient states that she is not taking the new statin that was just prescribed since it made her miserable.    HPI: Lisa Matthews is a 65 y.o. female  Diabetes She presents for her follow-up (fsbs - 100 metformin 500 mg tid) diabetic visit. She has type 2 diabetes mellitus. Her disease course has been improving. Pertinent negatives for diabetes include no blurred vision, no chest pain, no fatigue, no foot ulcerations, no polydipsia, no polyphagia, no visual change, no weakness and no weight loss.  Abdominal Pain This is a recurrent (seeing dr Alice Reichert for such stable improving) problem. The current episode started more than 1 year ago. The onset quality is sudden. Pertinent negatives include no weight loss.   Chief Complaint  Patient presents with   Diabetes   Flank Pain    Patient states she is having pain under her ribs on her left side still   Medication Problem    Patient states that she is not taking the new statin that was just prescribed since it made her miserable.    Relevant past medical, surgical, family and social history reviewed and updated as indicated. Interim medical history since our last visit reviewed. Allergies and medications reviewed and updated.  Review of Systems  Constitutional:  Negative for fatigue and weight loss.  Eyes:  Negative for blurred vision.  Cardiovascular:  Negative for chest pain.  Gastrointestinal:  Positive for abdominal pain.  Endocrine: Negative for polydipsia and polyphagia.  Neurological:  Negative for weakness.   Per HPI  unless specifically indicated above     Objective:    BP 134/84    Pulse (!) 54    Temp 98 F (36.7 C) (Oral)    Ht 5' 3.39" (1.61 m)    Wt 188 lb 8 oz (85.5 kg)    SpO2 98%    BMI 32.99 kg/m   Wt Readings from Last 3 Encounters:  05/17/21 188 lb 8 oz (85.5 kg)  05/02/21 184 lb 12.8 oz (83.8 kg)  04/17/21 185 lb (83.9 kg)    Physical Exam Vitals and nursing note reviewed.  Constitutional:      General: She is not in acute distress.    Appearance: Normal appearance. She is not ill-appearing or diaphoretic.  Pulmonary:     Breath sounds: No rhonchi.  Skin:    General: Skin is warm and dry.     Coloration: Skin is not jaundiced.     Findings: No erythema.  Neurological:     Mental Status: She is alert.  Unable to peform sec to virtual visit.   Results for orders placed or performed in visit on 04/10/21  Lipid Panel w/o Chol/HDL Ratio  Result Value Ref Range   Cholesterol, Total 217 (H) 100 - 199 mg/dL   Triglycerides 219 (H) 0 - 149 mg/dL   HDL 47 >39 mg/dL  VLDL Cholesterol Cal 39 5 - 40 mg/dL  ° LDL Chol Calc (NIH) 131 (H) 0 - 99 mg/dL  °Bayer DCA Hb A1c Waived  °Result Value Ref Range  ° HB A1C (BAYER DCA - WAIVED) 7.6 (H) 4.8 - 5.6 %  ° °   ° ° °Current Outpatient Medications:  °•  ACCU-CHEK GUIDE test strip, USE TO CHECK BLOOD SUGAR 4 TIMES DAILY, Disp: , Rfl:  °•  acetaminophen (TYLENOL) 650 MG CR tablet, Take by mouth as needed., Disp: , Rfl:  °•  amLODipine (NORVASC) 2.5 MG tablet, Take 1 tablet by mouth once daily, Disp: 90 tablet, Rfl: 0 °•  aspirin EC 81 MG tablet, Take 1 tablet (81 mg total) by mouth daily. Swallow whole., Disp: 90 tablet, Rfl: 3 °•  atenolol (TENORMIN) 50 MG tablet, Take 1 tablet by mouth once daily, Disp: 90 tablet, Rfl: 0 °•  Biotin w/ Vitamins C & E (HAIR/SKIN/NAILS PO), Take by mouth daily., Disp: , Rfl:  °•  Blood Glucose Monitoring Suppl (ACCU-CHEK GUIDE ME) w/Device KIT, CHECK BLOOD SUGAR 4 TIMES DAILY, Disp: , Rfl:  °•  Calcium Carbonate-Vitamin D  600-400 MG-UNIT tablet, Take 1 tablet by mouth daily., Disp: , Rfl:  °•  Cholecalciferol 25 MCG (1000 UT) tablet, Take 1,000 Units by mouth daily., Disp: , Rfl:  °•  cyanocobalamin 1000 MCG tablet, Take 1,000 mcg by mouth daily., Disp: , Rfl:  °•  dapagliflozin propanediol (FARXIGA) 10 MG TABS tablet, Take 1 tablet (10 mg total) by mouth daily., Disp: 90 tablet, Rfl: 3 °•  diclofenac Sodium (VOLTAREN) 1 % GEL, Apply 2 g topically 4 (four) times daily., Disp: 100 g, Rfl: 1 °•  ELDERBERRY PO, Take by mouth daily., Disp: , Rfl:  °•  estradiol (ESTRACE) 0.1 MG/GM vaginal cream, Estrogen Cream Instruction Discard applicator Apply pea sized amount to tip of finger to urethra before bed. Wash hands well after application. Use Monday, Wednesday and Friday, Disp: 42.5 g, Rfl: 1 °•  gabapentin (NEURONTIN) 300 MG capsule, Take 1 capsule (300 mg total) by mouth 2 (two) times daily. (Patient taking differently: Take 300 mg by mouth 4 (four) times daily.), Disp: 60 capsule, Rfl: 3 °•  Lancets MISC, , Disp: , Rfl:  °•  linaclotide (LINZESS) 145 MCG CAPS capsule, Take 1 capsule (145 mcg total) by mouth daily before breakfast., Disp: 90 capsule, Rfl: 1 °•  losartan (COZAAR) 25 MG tablet, Take 1 tablet by mouth once daily, Disp: 90 tablet, Rfl: 1 °•  meloxicam (MOBIC) 15 MG tablet, Take 1 tablet (15 mg total) by mouth daily., Disp: 30 tablet, Rfl: 0 °•  metFORMIN (GLUCOPHAGE) 500 MG tablet, Take 1 tablet (500 mg total) by mouth in the morning, at noon, and at bedtime., Disp: 90 tablet, Rfl: 3 °•  mirtazapine (REMERON) 30 MG tablet, Take 30 mg by mouth., Disp: , Rfl:  °•  oxyCODONE (OXY IR/ROXICODONE) 5 MG immediate release tablet, Take 5 mg by mouth 3 (three) times daily as needed., Disp: , Rfl:  °•  promethazine (PHENERGAN) 12.5 MG tablet, Take 1 tablet (12.5 mg total) by mouth every 8 (eight) hours as needed for nausea or vomiting., Disp: 20 tablet, Rfl: 0 °•  Teriparatide, Recombinant, 620 MCG/2.48ML SOPN, Inject into the skin at  bedtime., Disp: , Rfl:  °•  tiZANidine (ZANAFLEX) 4 MG tablet, TAKE 1 TABLET BY MOUTH AT BEDTIME . APPOINTMENT REQUIRED FOR FUTURE REFILLS, Disp: 30 tablet, Rfl: 0 °•  venlafaxine XR (EFFEXOR-XR) 150 MG 24 hr   capsule, Take 1 capsule (150 mg total) by mouth in the morning and at bedtime., Disp: 180 capsule, Rfl: 1   vitamin C (ASCORBIC ACID) 500 MG tablet, Take 500 mg by mouth daily., Disp: , Rfl:    zinc gluconate 50 MG tablet, Take 50 mg by mouth daily., Disp: , Rfl:    fenofibrate (TRICOR) 145 MG tablet, Take 1 tablet (145 mg total) by mouth daily. (Patient not taking: Reported on 05/17/2021), Disp: 30 tablet, Rfl: 6   ibuprofen (ADVIL) 600 MG tablet, Take 1 tablet by mouth twice daily as needed (Patient not taking: Reported on 05/17/2021), Disp: 30 tablet, Rfl: 0   omeprazole (PRILOSEC) 40 MG capsule, Take 1 capsule by mouth once daily (Patient not taking: Reported on 05/17/2021), Disp: 90 capsule, Rfl: 1   Pitavastatin Calcium 1 MG TABS, Take 1 tablet (1 mg total) by mouth daily. (Patient not taking: Reported on 05/17/2021), Disp: 30 tablet, Rfl: 2    Assessment & Plan:  DM :  A1c at 7.6 says she has a dexacom - dings when sugars are about 100 per pt.asymptomatic to change settings to alert her when sugars < 70 check HbA1c,  urine  microalbumin  diabetic diet plan given to pt  adviced regarding hypoglycemia and instructions given to pt today on how to prevent and treat the same if it were to occur. pt acknowledges the plan and voices understanding of the same.  exercise plan given and encouraged.   advice diabetic yearly podiatry, ophthalmology , nutritionist , dental check q 6 months   2. Abdominal pain : is on protonix 40 mg daily was on omeprazole that was stopped. To use famotidine for breakthrough symptoms.  Seeing dr. Alice Reichert  --05/05/2021: CT A/P W: Indic: epi pain: IMPRESSION: 1. There does not appear to be an acute intra-abdominal pelvic process.  2. There are postsurgical changes  of gastric bypass without evidence of inflammatory changes. 3. There are bilateral intrarenal calculi which are nonobstructive. There is fatty infiltration of the liver.   Is on oxycodone through pain mx that helps with abdominal pain  Advised not to take these but seek medical help @ ER if recurs  Pt has had nuasea took phenergan for such ,    3. Chronic migraines - MRI wnl [facility] has had chronic lacunar infarcts Fu and mx per neruo.     Problem List Items Addressed This Visit   None Visit Diagnoses     Need for pneumococcal vaccination    -  Primary   Relevant Orders   Pneumococcal conjugate vaccine 13-valent IM (Completed)        Orders Placed This Encounter  Procedures   Pneumococcal conjugate vaccine 13-valent IM     No orders of the defined types were placed in this encounter.    Follow up plan: No follow-ups on file.

## 2021-05-21 ENCOUNTER — Encounter: Payer: Self-pay | Admitting: Internal Medicine

## 2021-05-21 ENCOUNTER — Other Ambulatory Visit: Payer: Self-pay | Admitting: Nurse Practitioner

## 2021-05-21 NOTE — Telephone Encounter (Signed)
Requested Prescriptions  Pending Prescriptions Disp Refills   venlafaxine XR (EFFEXOR-XR) 150 MG 24 hr capsule [Pharmacy Med Name: Venlafaxine HCl ER 150 MG Oral Capsule Extended Release 24 Hour] 180 capsule 0    Sig: TAKE 1 CAPSULE BY MOUTH IN THE MORNING AND AT BEDTIME     Psychiatry: Antidepressants - SNRI - desvenlafaxine & venlafaxine Failed - 05/21/2021  9:08 AM      Failed - LDL in normal range and within 360 days    LDL Chol Calc (NIH)  Date Value Ref Range Status  04/10/2021 131 (H) 0 - 99 mg/dL Final         Failed - Total Cholesterol in normal range and within 360 days    Cholesterol, Total  Date Value Ref Range Status  04/10/2021 217 (H) 100 - 199 mg/dL Final         Failed - Triglycerides in normal range and within 360 days    Triglycerides  Date Value Ref Range Status  04/10/2021 219 (H) 0 - 149 mg/dL Final         Passed - Completed PHQ-2 or PHQ-9 in the last 360 days      Passed - Last BP in normal range    BP Readings from Last 1 Encounters:  05/17/21 134/84         Passed - Valid encounter within last 6 months    Recent Outpatient Visits          4 days ago Need for pneumococcal vaccination   [facility] Vigg, Avanti, MD   2 weeks ago Epigastric pain   [facility] Vigg, Avanti, MD   1 month ago Need for influenza vaccination   [facility] Vigg, Avanti, MD   3 months ago Acute cystitis with hematuria   Braman, Santiago Glad, NP   4 months ago Type 2 diabetes mellitus without complication, without long-term current use of insulin ([place])   Marfa McElwee, Lauren A, NP      Future Appointments            In 2 months Vigg, Avanti, MD [facility]

## 2021-05-23 NOTE — Progress Notes (Signed)
There were no vitals taken for this visit.   Subjective:    Patient ID: Lisa Matthews, female    DOB: 07-09-1955, 65 y.o.   MRN: [id number]  HPI: Lisa Matthews is a 64 y.o. female  Chief Complaint  Patient presents with   URI    Pt states she has been having a cough, congestion, body aches, fever, sore throat, and chills since Sunday. States she has had negative home covid tests.    UPPER RESPIRATORY TRACT INFECTION Worst symptom: symptoms started on Sunday Fever: yes- 102 Cough: yes Shortness of breath: no Wheezing: yes Chest pain: no Chest tightness: no Chest congestion: no Nasal congestion: yes Runny nose: yes Post nasal drip: yes Sneezing: yes Sore throat: yes Swollen glands: yes Sinus pressure: yes Headache: yes Face pain: no Toothache: no Ear pain: yes bilateral Ear pressure: yes bilateral Eyes red/itching:yes Eye drainage/crusting: yes  Vomiting: no Rash: no Fatigue: yes Sick contacts: no Strep contacts: no  Context: stable Recurrent sinusitis: no Relief with OTC cold/cough medications: yes  Treatments attempted:  tylenol and Nasal spray     Relevant past medical, surgical, family and social history reviewed and updated as indicated. Interim medical history since our last visit reviewed. Allergies and medications reviewed and updated.  Review of Systems  Constitutional:  Positive for fatigue and fever.  HENT:  Positive for congestion, ear pain, postnasal drip, rhinorrhea, sinus pressure, sinus pain, sneezing and sore throat. Negative for dental problem.   Respiratory:  Positive for cough and wheezing. Negative for shortness of breath.   Cardiovascular:  Negative for chest pain.  Gastrointestinal:  Negative for vomiting.  Skin:  Negative for rash.  Neurological:  Positive for headaches.   Per HPI unless specifically indicated above     Objective:    There were no vitals taken for this visit.  Wt Readings from Last 3 Encounters:  05/17/21  188 lb 8 oz (85.5 kg)  05/02/21 184 lb 12.8 oz (83.8 kg)  04/17/21 185 lb (83.9 kg)    Physical Exam Vitals and nursing note reviewed.  Pulmonary:     Effort: Pulmonary effort is normal. No respiratory distress.  Neurological:     Mental Status: She is alert.  Psychiatric:        Mood and Affect: Mood normal.        Behavior: Behavior normal.        Thought Content: Thought content normal.        Judgment: Judgment normal.    Results for orders placed or performed in visit on 04/10/21  Lipid Panel w/o Chol/HDL Ratio  Result Value Ref Range   Cholesterol, Total 217 (H) 100 - 199 mg/dL   Triglycerides 219 (H) 0 - 149 mg/dL   HDL 47 >39 mg/dL   VLDL Cholesterol Cal 39 5 - 40 mg/dL   LDL Chol Calc (NIH) 131 (H) 0 - 99 mg/dL  Bayer DCA Hb A1c Waived  Result Value Ref Range   HB A1C (BAYER DCA - WAIVED) 7.6 (H) 4.8 - 5.6 %      Assessment & Plan:   Problem List Items Addressed This Visit   None Visit Diagnoses     Viral upper respiratory tract infection    -  Primary   Azithromycin and albuterol sent.. Patient declines coming to office to be swabbed. Discussed s/s to seek higher level of care. FU if symptoms worsen.   Relevant Medications   azithromycin (ZITHROMAX) 250 MG tablet  Follow up plan: Return if symptoms worsen or fail to improve.   This visit was completed via MyChart due to the restrictions of the COVID-19 pandemic. All issues as above were discussed and addressed. Physical exam was done as above through visual confirmation on MyChart. If it was felt that the patient should be evaluated in the office, they were directed there. The patient verbally consented to this visit. Location of the patient: Home Location of the provider: Office Those involved with this call:  Provider: Jon Billings, NP CMA: Yvonna Alanis, CMA Front Desk/Registration: [employer] This encounter was conducted via phone.  I spent 15 dedicated to the care of this patient  on the  date of this encounter to include previsit review of 21, with the patient, and post visit ordering of testing.

## 2021-05-24 ENCOUNTER — Telehealth (INDEPENDENT_AMBULATORY_CARE_PROVIDER_SITE_OTHER): Payer: Medicare Other | Admitting: Nurse Practitioner

## 2021-05-24 ENCOUNTER — Encounter: Payer: Self-pay | Admitting: Nurse Practitioner

## 2021-05-24 DIAGNOSIS — J069 Acute upper respiratory infection, unspecified: Secondary | ICD-10-CM | POA: Diagnosis not present

## 2021-05-24 MED ORDER — AZITHROMYCIN 250 MG PO TABS: ORAL_TABLET | ORAL | 0 refills | Status: AC

## 2021-05-24 MED ORDER — ALBUTEROL SULFATE HFA 108 (90 BASE) MCG/ACT IN AERS: 2.0000 | INHALATION_SPRAY | Freq: Four times a day (QID) | RESPIRATORY_TRACT | 0 refills | Status: DC | PRN

## 2021-05-30 ENCOUNTER — Other Ambulatory Visit: Payer: Self-pay

## 2021-05-30 ENCOUNTER — Encounter: Payer: Self-pay | Admitting: Psychiatry

## 2021-05-30 ENCOUNTER — Ambulatory Visit (INDEPENDENT_AMBULATORY_CARE_PROVIDER_SITE_OTHER): Payer: Medicare Other | Admitting: Psychiatry

## 2021-05-30 ENCOUNTER — Encounter: Payer: Self-pay | Admitting: Gastroenterology

## 2021-05-30 VITALS — BP 124/82 | HR 69 | Temp 97.6°F | Ht 64.0 in | Wt 188.0 lb

## 2021-05-30 DIAGNOSIS — Z008 Encounter for other general examination: Secondary | ICD-10-CM | POA: Diagnosis not present

## 2021-05-30 DIAGNOSIS — Z8659 Personal history of other mental and behavioral disorders: Secondary | ICD-10-CM | POA: Diagnosis not present

## 2021-05-30 DIAGNOSIS — F419 Anxiety disorder, unspecified: Secondary | ICD-10-CM | POA: Diagnosis not present

## 2021-05-30 DIAGNOSIS — G894 Chronic pain syndrome: Secondary | ICD-10-CM

## 2021-05-30 DIAGNOSIS — S8000XA Contusion of unspecified knee, initial encounter: Secondary | ICD-10-CM | POA: Insufficient documentation

## 2021-05-30 NOTE — H&P (Signed)
Pre-Procedure H&P   Patient ID: Lisa Matthews is a 65 y.o. female.  Gastroenterology Provider: Annamaria Helling, DO  Referring Provider: Dawson Bills, NP PCP: Charlynne Cousins, MD  Date: 05/31/2021  HPI Ms. Lisa Matthews is a 65 y.o. female who presents today for Esophagogastroduodenoscopy and Colonoscopy for chronic abdominal pain and personal history of colon polyps.  Pt with chronic abdominal pain. Persistent constipation- miralax has not helped her.C/o epigastric and LUQ pain and nausea. Hgb 12.8, mcv 91  CT 05/05/21 in ED during visit was unremarkable outside of fatty liver disease.  Colonoscopy in [place] 5 years ago with polyps per patient report. Was supposed to have Colonoscopy last year at [facility], but was still having solid stool (despite reported completion of prep) prior to procedure which was not attempted for this reason.  H/o roux en y bypass. Taking bc powders daily. EGD 10+ years ago- normal.  Cologuard negative 2022 Active tobacco use. Multiple centrally acting agents on medication list.  Past Medical History:  Diagnosis Date   Allergy    Anxiety    Arthritis    COPD (chronic obstructive pulmonary disease) ([place])    Depression    Diabetes mellitus without complication (HCC)    GERD (gastroesophageal reflux disease)    Heart murmur    Hypertension    Kidney stones    Macular degeneration    Osteoporosis    Sleep apnea    Stroke [facility])     Past Surgical History:  Procedure Laterality Date   CESAREAN SECTION     CHOLECYSTECTOMY     CYSTOSCOPY/URETEROSCOPY/HOLMIUM LASER/STENT PLACEMENT     FRACTURE SURGERY     JOINT REPLACEMENT Right    knee   TONSILLECTOMY     TUBAL LIGATION      Family History No h/o GI disease or malignancy  Review of Systems  Constitutional:  Positive for unexpected weight change (25lbs in 3 months). Negative for activity change, appetite change, chills, diaphoresis, fatigue and fever.  HENT:  Negative for trouble  swallowing and voice change.   Respiratory:  Negative for shortness of breath and wheezing.   Cardiovascular:  Negative for chest pain, palpitations and leg swelling.  Gastrointestinal:  Positive for abdominal pain, constipation and nausea. Negative for abdominal distention, anal bleeding, blood in stool, diarrhea, rectal pain and vomiting.  Musculoskeletal:  Negative for arthralgias and myalgias.  Skin:  Negative for color change and pallor.  Neurological:  Negative for dizziness, syncope and weakness.  Psychiatric/Behavioral:  Negative for confusion.   All other systems reviewed and are negative.   Medications No current facility-administered medications on file prior to encounter.   Current Outpatient Medications on File Prior to Encounter  Medication Sig Dispense Refill   atenolol (TENORMIN) 50 MG tablet Take 1 tablet by mouth once daily 90 tablet 0   cyclobenzaprine (FLEXERIL) 10 MG tablet Take 10 mg by mouth 3 (three) times daily as needed for muscle spasms.     gemfibrozil (LOPID) 600 MG tablet Take 600 mg by mouth 2 (two) times daily before a meal.     glyBURIDE-metformin (GLUCOVANCE) 2.5-500 MG tablet Take 1 tablet by mouth daily with breakfast.     ibuprofen (ADVIL) 600 MG tablet Take 600 mg by mouth 2 (two) times daily as needed.     losartan (COZAAR) 25 MG tablet Take 1 tablet by mouth once daily 90 tablet 1   metaxalone (SKELAXIN) 800 MG tablet Take 800 mg by mouth 3 (three) times daily.  tolterodine (DETROL LA) 4 MG 24 hr capsule Take 4 mg by mouth daily.     ACCU-CHEK GUIDE test strip USE TO CHECK BLOOD SUGAR 4 TIMES DAILY     acetaminophen (TYLENOL) 650 MG CR tablet Take by mouth as needed.     amLODipine (NORVASC) 2.5 MG tablet Take 1 tablet by mouth once daily 90 tablet 0   aspirin EC 81 MG tablet Take 1 tablet (81 mg total) by mouth daily. Swallow whole. 90 tablet 3   Biotin w/ Vitamins C & E (HAIR/SKIN/NAILS PO) Take by mouth daily.     Blood Glucose Monitoring  Suppl (ACCU-CHEK GUIDE ME) w/Device KIT CHECK BLOOD SUGAR 4 TIMES DAILY     Calcium Carbonate-Vitamin D 600-400 MG-UNIT tablet Take 1 tablet by mouth daily.     Cholecalciferol 25 MCG (1000 UT) tablet Take 1,000 Units by mouth daily.     cyanocobalamin 1000 MCG tablet Take 1,000 mcg by mouth daily.     dapagliflozin propanediol (FARXIGA) 10 MG TABS tablet Take 1 tablet (10 mg total) by mouth daily. 90 tablet 3   diclofenac Sodium (VOLTAREN) 1 % GEL Apply 2 g topically 4 (four) times daily. 100 g 1   ELDERBERRY PO Take by mouth daily.     estradiol (ESTRACE) 0.1 MG/GM vaginal cream Estrogen Cream Instruction Discard applicator Apply pea sized amount to tip of finger to urethra before bed. Wash hands well after application. Use Monday, Wednesday and Friday 42.5 g 1   gabapentin (NEURONTIN) 300 MG capsule Take 1 capsule (300 mg total) by mouth 2 (two) times daily. (Patient taking differently: Take 300 mg by mouth 4 (four) times daily.) 60 capsule 3   Lancets MISC      linaclotide (LINZESS) 145 MCG CAPS capsule Take 1 capsule (145 mcg total) by mouth daily before breakfast. 90 capsule 1   meloxicam (MOBIC) 15 MG tablet Take 1 tablet (15 mg total) by mouth daily. (Patient not taking: Reported on 05/30/2021) 30 tablet 0   metFORMIN (GLUCOPHAGE) 500 MG tablet Take 1 tablet (500 mg total) by mouth in the morning, at noon, and at bedtime. 90 tablet 3   mirtazapine (REMERON) 30 MG tablet Take 30 mg by mouth.     omeprazole (PRILOSEC) 40 MG capsule Take 1 capsule by mouth once daily (Patient not taking: Reported on 05/17/2021) 90 capsule 1   oxyCODONE (OXY IR/ROXICODONE) 5 MG immediate release tablet Take 5 mg by mouth 3 (three) times daily as needed.     Pitavastatin Calcium 1 MG TABS Take 1 tablet (1 mg total) by mouth daily. (Patient not taking: Reported on 05/17/2021) 30 tablet 2   promethazine (PHENERGAN) 12.5 MG tablet Take 1 tablet (12.5 mg total) by mouth every 8 (eight) hours as needed for nausea or  vomiting. 20 tablet 0   Teriparatide, Recombinant, 620 MCG/2.48ML SOPN Inject into the skin at bedtime.     vitamin C (ASCORBIC ACID) 500 MG tablet Take 500 mg by mouth daily.     zinc gluconate 50 MG tablet Take 50 mg by mouth daily.      Pertinent medications related to GI and procedure were reviewed by me with the patient prior to the procedure   Current Facility-Administered Medications:    0.9 %  sodium chloride infusion, , Intravenous, Continuous, Annamaria Helling, DO   0.9 %  sodium chloride infusion, , Intravenous, Continuous, Annamaria Helling, DO      Allergies  Allergen Reactions   Sitagliptin Anaphylaxis  Atorvastatin Other (See Comments) and Rash    Pain Pain Pain Pain    Bupropion Other (See Comments)    "Make me delirious" "Make me delirious" "Make me delirious" "Make me delirious"    Nitrofurantoin Macrocrystal Rash   Nsaids Hives and Rash   Other Hives   Pravastatin Other (See Comments)   Ciprofloxacin Other (See Comments)    Patient does not know Patient does not know    Doxycycline     headache   Antihistamines, Loratadine-Type Rash   Exenatide Rash   Ezetimibe Rash   Fluconazole Rash   Hydromorphone Rash   Morphine Rash   Oxybutynin Nausea And Vomiting   Penicillins Rash   Prednisone Rash   Simvastatin Rash   Sulfa Antibiotics Rash   Trazodone Rash   Wound Dressing Adhesive Rash   Allergies were reviewed by me prior to the procedure  Objective    Vitals:   05/31/21 0723  BP: (!) 165/74  Pulse: 62  Resp: 16  Temp: (!) 96.1 F (35.6 C)  TempSrc: Temporal  SpO2: 96%  Weight: 79.4 kg  Height: '5\' 4"'  (1.626 m)     Physical Exam Vitals and nursing note reviewed.  Constitutional:      General: She is not in acute distress.    Appearance: Normal appearance. She is not ill-appearing, toxic-appearing or diaphoretic.  HENT:     Head: Normocephalic and atraumatic.     Nose: Nose normal.     Mouth/Throat:     Mouth:  Mucous membranes are moist.     Pharynx: Oropharynx is clear.     Comments: Upper and lower dentures in place Eyes:     General: No scleral icterus.    Extraocular Movements: Extraocular movements intact.  Cardiovascular:     Rate and Rhythm: Normal rate and regular rhythm.     Heart sounds: Normal heart sounds. No murmur heard.   No friction rub. No gallop.  Pulmonary:     Effort: Pulmonary effort is normal. No respiratory distress.     Breath sounds: Normal breath sounds. No wheezing, rhonchi or rales.  Abdominal:     General: Bowel sounds are normal. There is no distension.     Palpations: Abdomen is soft.     Tenderness: There is no abdominal tenderness. There is no guarding or rebound.  Musculoskeletal:     Cervical back: Neck supple.     Right lower leg: No edema.     Left lower leg: No edema.  Skin:    General: Skin is warm and dry.     Coloration: Skin is not jaundiced or pale.  Neurological:     General: No focal deficit present.     Mental Status: She is alert and oriented to person, place, and time. Mental status is at baseline.  Psychiatric:        Mood and Affect: Mood normal.        Behavior: Behavior normal.        Thought Content: Thought content normal.        Judgment: Judgment normal.     Assessment:  Ms. Lisa Matthews is a 65 y.o. female  who presents today for Esophagogastroduodenoscopy and Colonoscopy for chronic abdominal pain and personal h/o colon polyps.  Plan:  Esophagogastroduodenoscopy and Colonoscopy with possible intervention today  Esophagogastroduodenoscopy and Colonoscopy with possible biopsy, control of bleeding, polypectomy, and interventions as necessary has been discussed with the patient/patient representative. Informed consent was obtained from the patient/patient representative  after explaining the indication, nature, and risks of the procedure including but not limited to death, bleeding, perforation, missed neoplasm/lesions,  cardiorespiratory compromise, and reaction to medications. Opportunity for questions was given and appropriate answers were provided. Patient/patient representative has verbalized understanding is amenable to undergoing the procedure.   Annamaria Helling, DO  [facility]  Portions of the record may have been created with voice recognition software. Occasional wrong-word or 'sound-a-like' substitutions may have occurred due to the inherent limitations of voice recognition software.  Read the chart carefully and recognize, using context, where substitutions may have occurred.
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
## 2021-05-31 ENCOUNTER — Encounter: Payer: Self-pay | Admitting: Psychiatry

## 2021-05-31 ENCOUNTER — Ambulatory Visit: Payer: Medicare Other | Admitting: Certified Registered Nurse Anesthetist

## 2021-05-31 ENCOUNTER — Ambulatory Visit
Admission: RE | Admit: 2021-05-31 | Discharge: 2021-05-31 | Disposition: A | Discharge status: Home / Self Care | Payer: Medicare Other | Attending: Gastroenterology | Admitting: Gastroenterology

## 2021-05-31 ENCOUNTER — Encounter: Payer: Self-pay | Admitting: Gastroenterology

## 2021-05-31 ENCOUNTER — Encounter
Admission: RE | Disposition: A | Discharge status: Home / Self Care | Payer: Self-pay | Source: Home / Self Care | Attending: Gastroenterology

## 2021-05-31 DIAGNOSIS — Z1211 Encounter for screening for malignant neoplasm of colon: Secondary | ICD-10-CM | POA: Insufficient documentation

## 2021-05-31 DIAGNOSIS — F32A Depression, unspecified: Secondary | ICD-10-CM | POA: Diagnosis not present

## 2021-05-31 DIAGNOSIS — K648 Other hemorrhoids: Secondary | ICD-10-CM | POA: Diagnosis not present

## 2021-05-31 DIAGNOSIS — R1013 Epigastric pain: Secondary | ICD-10-CM | POA: Diagnosis present

## 2021-05-31 DIAGNOSIS — D122 Benign neoplasm of ascending colon: Secondary | ICD-10-CM | POA: Insufficient documentation

## 2021-05-31 DIAGNOSIS — D123 Benign neoplasm of transverse colon: Secondary | ICD-10-CM | POA: Insufficient documentation

## 2021-05-31 DIAGNOSIS — Z8719 Personal history of other diseases of the digestive system: Secondary | ICD-10-CM | POA: Insufficient documentation

## 2021-05-31 DIAGNOSIS — I10 Essential (primary) hypertension: Secondary | ICD-10-CM | POA: Insufficient documentation

## 2021-05-31 DIAGNOSIS — M1990 Unspecified osteoarthritis, unspecified site: Secondary | ICD-10-CM | POA: Insufficient documentation

## 2021-05-31 DIAGNOSIS — K621 Rectal polyp: Secondary | ICD-10-CM | POA: Diagnosis not present

## 2021-05-31 DIAGNOSIS — J449 Chronic obstructive pulmonary disease, unspecified: Secondary | ICD-10-CM | POA: Diagnosis not present

## 2021-05-31 DIAGNOSIS — K5790 Diverticulosis of intestine, part unspecified, without perforation or abscess without bleeding: Secondary | ICD-10-CM | POA: Diagnosis not present

## 2021-05-31 DIAGNOSIS — F419 Anxiety disorder, unspecified: Secondary | ICD-10-CM | POA: Insufficient documentation

## 2021-05-31 DIAGNOSIS — Z8673 Personal history of transient ischemic attack (TIA), and cerebral infarction without residual deficits: Secondary | ICD-10-CM | POA: Diagnosis not present

## 2021-05-31 DIAGNOSIS — K219 Gastro-esophageal reflux disease without esophagitis: Secondary | ICD-10-CM | POA: Insufficient documentation

## 2021-05-31 DIAGNOSIS — Z6830 Body mass index (BMI) 30.0-30.9, adult: Secondary | ICD-10-CM | POA: Diagnosis not present

## 2021-05-31 DIAGNOSIS — E669 Obesity, unspecified: Secondary | ICD-10-CM | POA: Insufficient documentation

## 2021-05-31 DIAGNOSIS — G8929 Other chronic pain: Secondary | ICD-10-CM | POA: Diagnosis not present

## 2021-05-31 DIAGNOSIS — Z9884 Bariatric surgery status: Secondary | ICD-10-CM | POA: Insufficient documentation

## 2021-05-31 DIAGNOSIS — K644 Residual hemorrhoidal skin tags: Secondary | ICD-10-CM | POA: Insufficient documentation

## 2021-05-31 DIAGNOSIS — E119 Type 2 diabetes mellitus without complications: Secondary | ICD-10-CM | POA: Diagnosis not present

## 2021-05-31 DIAGNOSIS — G4730 Sleep apnea, unspecified: Secondary | ICD-10-CM | POA: Insufficient documentation

## 2021-05-31 DIAGNOSIS — K2970 Gastritis, unspecified, without bleeding: Secondary | ICD-10-CM | POA: Diagnosis not present

## 2021-05-31 DIAGNOSIS — K635 Polyp of colon: Secondary | ICD-10-CM | POA: Insufficient documentation

## 2021-05-31 DIAGNOSIS — K5730 Diverticulosis of large intestine without perforation or abscess without bleeding: Secondary | ICD-10-CM | POA: Diagnosis not present

## 2021-05-31 DIAGNOSIS — K259 Gastric ulcer, unspecified as acute or chronic, without hemorrhage or perforation: Secondary | ICD-10-CM | POA: Insufficient documentation

## 2021-05-31 DIAGNOSIS — Z86010 Personal history of colonic polyps: Secondary | ICD-10-CM | POA: Diagnosis not present

## 2021-05-31 HISTORY — DX: Cerebral infarction, unspecified: I63.9

## 2021-05-31 HISTORY — DX: Unspecified macular degeneration: H35.30

## 2021-05-31 HISTORY — DX: Sleep apnea, unspecified: G47.30

## 2021-05-31 HISTORY — PX: COLONOSCOPY WITH PROPOFOL: SHX5780

## 2021-05-31 HISTORY — DX: Gastro-esophageal reflux disease without esophagitis: K21.9

## 2021-05-31 HISTORY — PX: ESOPHAGOGASTRODUODENOSCOPY (EGD) WITH PROPOFOL: SHX5813

## 2021-05-31 LAB — GLUCOSE, CAPILLARY: Glucose-Capillary: 243 mg/dL — ABNORMAL HIGH (ref 70–99)

## 2021-05-31 SURGERY — ESOPHAGOGASTRODUODENOSCOPY (EGD) WITH PROPOFOL
Anesthesia: General

## 2021-05-31 MED ORDER — PHENYLEPHRINE HCL-NACL 20-0.9 MG/250ML-% IV SOLN
INTRAVENOUS | Status: AC
  Filled 2021-05-31: qty 250

## 2021-05-31 MED ORDER — PROPOFOL 10 MG/ML IV BOLUS
INTRAVENOUS | Status: DC | PRN
  Administered 2021-05-31: 20 mg via INTRAVENOUS
  Administered 2021-05-31: 50 mg via INTRAVENOUS
  Administered 2021-05-31: 10 mg via INTRAVENOUS

## 2021-05-31 MED ORDER — PROPOFOL 500 MG/50ML IV EMUL
INTRAVENOUS | Status: AC
  Filled 2021-05-31: qty 50

## 2021-05-31 MED ORDER — LIDOCAINE HCL (PF) 2 % IJ SOLN
INTRAMUSCULAR | Status: AC
  Filled 2021-05-31: qty 5

## 2021-05-31 MED ORDER — SODIUM CHLORIDE 0.9 % IV SOLN: INTRAVENOUS | Status: DC

## 2021-05-31 MED ORDER — PROPOFOL 500 MG/50ML IV EMUL
INTRAVENOUS | Status: DC | PRN
  Administered 2021-05-31: 150 ug/kg/min via INTRAVENOUS

## 2021-05-31 MED ORDER — LIDOCAINE HCL (CARDIAC) PF 100 MG/5ML IV SOSY
PREFILLED_SYRINGE | INTRAVENOUS | Status: DC | PRN
  Administered 2021-05-31: 100 mg via INTRAVENOUS

## 2021-05-31 MED ORDER — PROPOFOL 500 MG/50ML IV EMUL
INTRAVENOUS | Status: AC
  Filled 2021-05-31: qty 200

## 2021-05-31 NOTE — Anesthesia Preprocedure Evaluation (Addendum)
Anesthesia Evaluation  Patient identified by MRN, date of birth, ID band Patient awake    Reviewed: Allergy & Precautions, NPO status , Patient's Chart, lab work & pertinent test results  History of Anesthesia Complications Negative for: history of anesthetic complications  Airway Mallampati: I   Neck ROM: Full    Dental  (+) Upper Dentures, Lower Dentures   Pulmonary sleep apnea , COPD, former smoker (quit 2007),    Pulmonary exam normal breath sounds clear to auscultation       Cardiovascular hypertension, Normal cardiovascular exam Rhythm:Regular Rate:Normal     Neuro/Psych  Headaches, PSYCHIATRIC DISORDERS Anxiety Depression Chronic pain CVA (found incidentally on MRI, no symptoms)    GI/Hepatic GERD  ,  Endo/Other  diabetes, Type 2Obesity   Renal/GU Renal disease (nephrolithiasis)     Musculoskeletal  (+) Arthritis ,   Abdominal   Peds  Hematology negative hematology ROS (+)   Anesthesia Other Findings Reviewed 10/26/20 cardiology note.  Reproductive/Obstetrics                           Anesthesia Physical Anesthesia Plan  ASA: 2  Anesthesia Plan: General   Post-op Pain Management:    Induction: Intravenous  PONV Risk Score and Plan: 3 and Propofol infusion, TIVA and Treatment may vary due to age or medical condition  Airway Management Planned: Natural Airway  Additional Equipment:   Intra-op Plan:   Post-operative Plan:   Informed Consent: I have reviewed the patients History and Physical, chart, labs and discussed the procedure including the risks, benefits and alternatives for the proposed anesthesia with the patient or authorized representative who has indicated his/her understanding and acceptance.       Plan Discussed with: CRNA  Anesthesia Plan Comments: (LMA/GETA backup discussed.  Patient consented for risks of anesthesia including but not limited to:  -  adverse reactions to medications - damage to eyes, teeth, lips or other oral mucosa - nerve damage due to positioning  - sore throat or hoarseness - damage to heart, brain, nerves, lungs, other parts of body or loss of life  Informed patient about role of CRNA in peri- and intra-operative care.  Patient voiced understanding.)        Anesthesia Quick Evaluation

## 2021-05-31 NOTE — Op Note (Signed)
[facility] Gastroenterology Patient Name: Lisa Matthews Procedure Date: 05/31/2021 6:48 AM MRN: [id number] Account #: [id number] Date of Birth: 27-Oct-1955 Admit Type: Outpatient Age: 65 Room: [facility] ENDO ROOM 1 Gender: Female Note Status: Finalized Instrument Name: Colonoscope [id number] Procedure:             Colonoscopy Indications:           High risk colon cancer surveillance: Personal history                         of colonic polyps Providers:             Annamaria Helling DO, DO Referring MD:          Charlynne Cousins (Referring MD) Medicines:             Monitored Anesthesia Care Complications:         No immediate complications. Estimated blood loss:                         Minimal. Procedure:             Pre-Anesthesia Assessment:                        - Prior to the procedure, a History and Physical was                         performed, and patient medications and allergies were                         reviewed. The patient is competent. The risks and                         benefits of the procedure and the sedation options and                         risks were discussed with the patient. All questions                         were answered and informed consent was obtained.                         Patient identification and proposed procedure were                         verified by the physician, the nurse, the anesthetist                         and the technician in the endoscopy suite. Mental                         Status Examination: alert and oriented. Airway                         Examination: normal oropharyngeal airway and neck                         mobility. Respiratory Examination: clear to  auscultation. CV Examination: RRR, no murmurs, no S3                         or S4. Prophylactic Antibiotics: The patient does not                         require prophylactic antibiotics. Prior                          Anticoagulants: The patient has taken no previous                         anticoagulant or antiplatelet agents. ASA Grade                         Assessment: II - A patient with mild systemic disease.                         After reviewing the risks and benefits, the patient                         was deemed in satisfactory condition to undergo the                         procedure. The anesthesia plan was to use monitored                         anesthesia care (MAC). Immediately prior to                         administration of medications, the patient was                         re-assessed for adequacy to receive sedatives. The                         heart rate, respiratory rate, oxygen saturations,                         blood pressure, adequacy of pulmonary ventilation, and                         response to care were monitored throughout the                         procedure. The physical status of the patient was                         re-assessed after the procedure.                        After obtaining informed consent, the colonoscope was                         passed under direct vision. Throughout the procedure,                         the patient's blood pressure, pulse, and oxygen  saturations were monitored continuously. The                         Colonoscope was introduced through the anus and                         advanced to the the cecum, identified by appendiceal                         orifice and ileocecal valve. The colonoscopy was                         performed without difficulty. The patient tolerated                         the procedure well. The quality of the bowel                         preparation was evaluated using the BBPS [facility] Bowel                         Preparation Scale) with scores of: Right Colon = 2                         (minor amount of residual staining, small fragments of                         stool  and/or opaque liquid, but mucosa seen well),                         Transverse Colon = 3 (entire mucosa seen well with no                         residual staining, small fragments of stool or opaque                         liquid) and Left Colon = 2 (minor amount of residual                         staining, small fragments of stool and/or opaque                         liquid, but mucosa seen well). The total BBPS score                         equals 7. The quality of the bowel preparation was                         good. The ileocecal valve, appendiceal orifice, and                         rectum were photographed. Findings:      Hemorrhoids were found on perianal exam.      The digital rectal exam was normal. Pertinent negatives include normal       sphincter tone.      A few small-mouthed diverticula were found in the left colon. Estimated  blood loss: none.      External and internal hemorrhoids were found during retroflexion.       Estimated blood loss: none.      A 4 to 5 mm polyp was found in the ascending colon. The polyp was       sessile. The polyp was removed with a cold snare. Resection and       retrieval were complete. Estimated blood loss was minimal.      A 1 to 2 mm polyp was found in the ascending colon. The polyp was       sessile. The polyp was removed with a cold biopsy forceps. Resection and       retrieval were complete. Estimated blood loss was minimal.      A 4 to 5 mm polyp was found in the transverse colon. The polyp was       sessile. The polyp was removed with a cold snare. Resection and       retrieval were complete. Estimated blood loss was minimal.      A 1 to 2 mm polyp was found in the transverse colon. The polyp was       sessile. The polyp was removed with a cold biopsy forceps. Resection and       retrieval were complete. Estimated blood loss was minimal.      A 1 to 2 mm polyp was found in the descending colon. The polyp was       sessile.  The polyp was removed with a cold biopsy forceps. Resection and       retrieval were complete. Estimated blood loss was minimal.      Two sessile polyps were found in the sigmoid colon. The polyps were 1 to       2 mm in size. These polyps were removed with a cold biopsy forceps.       Resection and retrieval were complete. Estimated blood loss was minimal.      A 1 to 2 mm polyp was found in the rectum. The polyp was sessile. The       polyp was removed with a cold biopsy forceps. Resection and retrieval       were complete. Estimated blood loss was minimal.      Right and transverse colon were extremely spastic limiting visualization       at times despite lavaging, insertion and withdrawal and insufflation ,       Estimated blood loss: none.      The exam was otherwise without abnormality on direct and retroflexion       views. Impression:            - Hemorrhoids found on perianal exam.                        - Diverticulosis in the left colon.                        - External and internal hemorrhoids.                        - One 4 to 5 mm polyp in the ascending colon, removed                         with a cold snare. Resected and retrieved.                        -  One 1 to 2 mm polyp in the ascending colon, removed                         with a cold biopsy forceps. Resected and retrieved.                        - One 4 to 5 mm polyp in the transverse colon, removed                         with a cold snare. Resected and retrieved.                        - One 1 to 2 mm polyp in the transverse colon, removed                         with a cold biopsy forceps. Resected and retrieved.                        - One 1 to 2 mm polyp in the descending colon, removed                         with a cold biopsy forceps. Resected and retrieved.                        - Two 1 to 2 mm polyps in the sigmoid colon, removed                         with a cold biopsy forceps. Resected and  retrieved.                        - One 1 to 2 mm polyp in the rectum, removed with a                         cold biopsy forceps. Resected and retrieved.                        - The examination was otherwise normal on direct and                         retroflexion views. Recommendation:        - Discharge patient to home.                        - Gastroparesis diet.                        - Continue present medications.                        - Await pathology results.                        - Repeat colonoscopy for surveillance based on                         pathology results.                        -  Return to GI office as previously scheduled. Procedure Code(s):     --- Professional ---                        [phone], Colonoscopy, flexible; with removal of                         tumor(s), polyp(s), or other lesion(s) by snare                         technique                        45380, 34, Colonoscopy, flexible; with biopsy, single                         or multiple Diagnosis Code(s):     --- Professional ---                        Z86.010, Personal history of colonic polyps                        K64.8, Other hemorrhoids                        K63.5, Polyp of colon                        K62.1, Rectal polyp                        K57.30, Diverticulosis of large intestine without                         perforation or abscess without bleeding CPT copyright 2019 American Medical Association. All rights reserved. The codes documented in this report are preliminary and upon coder review may  be revised to meet current compliance requirements. Attending Participation:      I personally performed the entire procedure. Volney American, DO Annamaria Helling DO, DO 05/31/2021 8:57:33 AM This report has been signed electronically. Number of Addenda: 0 Note Initiated On: 05/31/2021 6:48 AM Scope Withdrawal Time: 0 hours 38 minutes 39 seconds  Total Procedure Duration: 0 hours 45  minutes 6 seconds  Estimated Blood Loss:  Estimated blood loss was minimal.      [facility]

## 2021-05-31 NOTE — Interval H&P Note (Signed)
History and Physical Interval Note: Preprocedure H&P from 05/31/21  was reviewed and there was no interval change after seeing and examining the patient.  Written consent was obtained from the patient after discussion of risks, benefits, and alternatives. Patient has consented to proceed with Esophagogastroduodenoscopy and Colonoscopy with possible intervention   05/31/2021 7:39 AM  Lisa Matthews  has presented today for surgery, with the diagnosis of Epigastric Pain (R10.13 Colon Polyps Abdominal Pain Constipation.  The various methods of treatment have been discussed with the patient and family. After consideration of risks, benefits and other options for treatment, the patient has consented to  Procedure(s) with comments: ESOPHAGOGASTRODUODENOSCOPY (EGD) WITH PROPOFOL (N/A) - DM COLONOSCOPY WITH PROPOFOL (N/A) as a surgical intervention.  The patient's history has been reviewed, patient examined, no change in status, stable for surgery.  I have reviewed the patient's chart and labs.  Questions were answered to the patient's satisfaction.     Annamaria Helling

## 2021-05-31 NOTE — Transfer of Care (Signed)
Immediate Anesthesia Transfer of Care Note  Patient: Lisa Matthews  Procedure(s) Performed: ESOPHAGOGASTRODUODENOSCOPY (EGD) WITH PROPOFOL COLONOSCOPY WITH PROPOFOL  Patient Location: PACU  Anesthesia Type:General  Level of Consciousness: drowsy  Airway & Oxygen Therapy: Patient Spontanous Breathing  Post-op Assessment: Report given to RN and Post -op Vital signs reviewed and stable  Post vital signs: Reviewed and stable  Last Vitals:  Vitals Value Taken Time  BP 126/68 05/31/21 0848  Temp    Pulse 50 05/31/21 0848  Resp 15 05/31/21 0848  SpO2 98 % 05/31/21 0848    Last Pain:  Vitals:   05/31/21 0848  TempSrc:   PainSc: Asleep         Complications: No notable events documented.

## 2021-05-31 NOTE — Anesthesia Procedure Notes (Signed)
Date/Time: 05/31/2021 7:40 AM Performed by: Demetrius Charity, CRNA Pre-anesthesia Checklist: Patient identified, Patient being monitored, Timeout performed, Emergency Drugs available and Suction available Patient Re-evaluated:Patient Re-evaluated prior to induction Oxygen Delivery Method: Circle system utilized and Supernova nasal CPAP Preoxygenation: Pre-oxygenation with 100% oxygen Airway Equipment and Method: Bite block Dental Injury: Teeth and Oropharynx as per pre-operative assessment

## 2021-05-31 NOTE — Anesthesia Postprocedure Evaluation (Signed)
Anesthesia Post Note  Patient: [name]  Procedure(s) Performed: ESOPHAGOGASTRODUODENOSCOPY (EGD) WITH PROPOFOL COLONOSCOPY WITH PROPOFOL  Patient location during evaluation: PACU Anesthesia Type: General Level of consciousness: awake and alert, oriented and patient cooperative Pain management: pain level controlled Vital Signs Assessment: post-procedure vital signs reviewed and stable Respiratory status: spontaneous breathing, nonlabored ventilation and respiratory function stable Cardiovascular status: blood pressure returned to baseline and stable Postop Assessment: adequate PO intake Anesthetic complications: no   No notable events documented.   Last Vitals:  Vitals:   05/31/21 0908 05/31/21 0918  BP: 126/76 (!) 167/71  Pulse: (!) 45   Resp: 11 12  Temp:    SpO2: 99% 100%    Last Pain:  Vitals:   05/31/21 0918  TempSrc:   PainSc: 0-No pain                 Darrin Nipper

## 2021-05-31 NOTE — Op Note (Signed)
[facility] Gastroenterology Patient Name: Lisa Matthews Procedure Date: 05/31/2021 7:30 AM MRN: [id number] Account #: [id number] Date of Birth: 1956/05/25 Admit Type: Outpatient Age: 65 Room: [facility] ENDO ROOM 1 Gender: Female Note Status: Finalized Instrument Name: Upper Endoscope [id number] Procedure:             Upper GI endoscopy Indications:           Epigastric abdominal pain Providers:             Annamaria Helling DO, DO Referring MD:          Charlynne Cousins (Referring MD) Medicines:             Monitored Anesthesia Care Complications:         No immediate complications. Estimated blood loss:                         Minimal. Procedure:             Pre-Anesthesia Assessment:                        - Prior to the procedure, a History and Physical was                         performed, and patient medications and allergies were                         reviewed. The patient is competent. The risks and                         benefits of the procedure and the sedation options and                         risks were discussed with the patient. All questions                         were answered and informed consent was obtained.                         Patient identification and proposed procedure were                         verified by the physician, the nurse, the anesthetist                         and the technician in the endoscopy suite. Mental                         Status Examination: alert and oriented. Airway                         Examination: normal oropharyngeal airway and neck                         mobility. Respiratory Examination: clear to                         auscultation. CV Examination: RRR, no murmurs, no S3  or S4. Prophylactic Antibiotics: The patient does not                         require prophylactic antibiotics. Prior                         Anticoagulants: The patient has taken no previous                          anticoagulant or antiplatelet agents. ASA Grade                         Assessment: II - A patient with mild systemic disease.                         After reviewing the risks and benefits, the patient                         was deemed in satisfactory condition to undergo the                         procedure. The anesthesia plan was to use monitored                         anesthesia care (MAC). Immediately prior to                         administration of medications, the patient was                         re-assessed for adequacy to receive sedatives. The                         heart rate, respiratory rate, oxygen saturations,                         blood pressure, adequacy of pulmonary ventilation, and                         response to care were monitored throughout the                         procedure. The physical status of the patient was                         re-assessed after the procedure.                        After obtaining informed consent, the endoscope was                         passed under direct vision. Throughout the procedure,                         the patient's blood pressure, pulse, and oxygen                         saturations were monitored continuously. The Endoscope  was introduced through the mouth, and advanced to the                         third part of duodenum. The upper GI endoscopy was                         accomplished without difficulty. The patient tolerated                         the procedure well. Findings:      Examined small bowel appeared normal, Estimated blood loss: none.      Two localized 1 to 3 mm erosions with no bleeding and no stigmata of       recent bleeding were found in the gastric body. Biopsies were taken with       a cold forceps for Helicobacter pylori testing. Estimated blood loss was       minimal.      Normal mucosa was found in the entire examined stomach. Gastric bypass        anatomy. Staple noted, intact. Estimated blood loss: none.      The Z-line was regular. Estimated blood loss: none.      Esophagogastric landmarks were identified: the gastroesophageal junction       was found at 35 cm from the incisors.      The exam was otherwise without abnormality. Impression:            - Erosive gastropathy with no bleeding and no stigmata                         of recent bleeding. Biopsied.                        - Normal mucosa was found in the entire stomach.                        - Z-line regular.                        - Esophagogastric landmarks identified.                        - The examination was otherwise normal. Recommendation:        - Discharge patient to home.                        - Gastroparesis diet with small frequent meals                         indefinitely.                        - No aspirin, ibuprofen, naproxen, or other                         non-steroidal anti-inflammatory drugs including bc                         powders indefinitely.                        - Continue present medications.                        -  Await pathology results.                        - Return to GI clinic as previously scheduled. Procedure Code(s):     --- Professional ---                        [phone], Esophagogastroduodenoscopy, flexible,                         transoral; with biopsy, single or multiple Diagnosis Code(s):     --- Professional ---                        K31.89, Other diseases of stomach and duodenum                        R10.13, Epigastric pain CPT copyright 2019 American Medical Association. All rights reserved. The codes documented in this report are preliminary and upon coder review may  be revised to meet current compliance requirements. Attending Participation:      I personally performed the entire procedure. Volney American, DO Annamaria Helling DO, DO 05/31/2021 8:51:35 AM This report has been signed electronically. Number  of Addenda: 0 Note Initiated On: 05/31/2021 7:30 AM Estimated Blood Loss:  Estimated blood loss was minimal.      [facility]

## 2021-06-01 ENCOUNTER — Other Ambulatory Visit: Payer: Self-pay | Admitting: Internal Medicine

## 2021-06-01 LAB — SURGICAL PATHOLOGY

## 2021-06-01 NOTE — Telephone Encounter (Signed)
Medication Refill - Medication: mirtazapine (REMERON) 30 MG tablet   Has the patient contacted their pharmacy? Yes.   (Agent: If no, request that the patient contact the pharmacy for the refill. If patient does not wish to contact the pharmacy document the reason why and proceed with request.) (Agent: If yes, when and what did the pharmacy advise?)  Preferred Pharmacy (with phone number or street name):  [facility] [place] [address]  [place] ([place] [address]  Phone: [phone] Fax: [phone]   Has the patient been seen for an appointment in the last year OR does the patient have an upcoming appointment? Yes.    Agent: Please be advised that RX refills may take up to 3 business days. We ask that you follow-up with your pharmacy.

## 2021-06-01 NOTE — Telephone Encounter (Signed)
Requested medication (s) are due for refill today: yes  Requested medication (s) are on the active medication list: yes  Last refill:  03/31/2015  Future visit scheduled: yes  Notes to clinic:  historical med, please advise     Requested Prescriptions  Pending Prescriptions Disp Refills   mirtazapine (REMERON) 30 MG tablet      Sig: Take 1 tablet (30 mg total) by mouth.     Psychiatry: Antidepressants - mirtazapine Failed - 06/01/2021 12:27 PM      Failed - Triglycerides in normal range and within 360 days    Triglycerides  Date Value Ref Range Status  04/10/2021 219 (H) 0 - 149 mg/dL Final          Failed - Total Cholesterol in normal range and within 360 days    Cholesterol, Total  Date Value Ref Range Status  04/10/2021 217 (H) 100 - 199 mg/dL Final          Passed - AST in normal range and within 360 days    AST  Date Value Ref Range Status  01/02/2021 17 0 - 40 IU/L Final          Passed - ALT in normal range and within 360 days    ALT  Date Value Ref Range Status  01/02/2021 14 0 - 32 IU/L Final          Passed - WBC in normal range and within 360 days    WBC  Date Value Ref Range Status  01/02/2021 6.5 3.4 - 10.8 x10E3/uL Final          Passed - Completed PHQ-2 or PHQ-9 in the last 360 days      Passed - Valid encounter within last 6 months    Recent Outpatient Visits           1 week ago Viral upper respiratory tract infection   [facility], NP   2 weeks ago Need for pneumococcal vaccination   [facility] Vigg, Avanti, MD   1 month ago Epigastric pain   [place] Vigg, Avanti, MD   1 month ago Need for influenza vaccination   [facility] Vigg, Avanti, MD   3 months ago Acute cystitis with hematuria   [place], NP       Future Appointments             In 2 months Vigg, Avanti, MD [facility]

## 2021-06-05 ENCOUNTER — Other Ambulatory Visit: Payer: Self-pay

## 2021-06-05 ENCOUNTER — Ambulatory Visit (INDEPENDENT_AMBULATORY_CARE_PROVIDER_SITE_OTHER): Payer: Medicare Other | Admitting: Nurse Practitioner

## 2021-06-05 ENCOUNTER — Encounter: Payer: Self-pay | Admitting: Nurse Practitioner

## 2021-06-05 DIAGNOSIS — I10 Essential (primary) hypertension: Secondary | ICD-10-CM

## 2021-06-05 DIAGNOSIS — M25531 Pain in right wrist: Secondary | ICD-10-CM

## 2021-06-05 DIAGNOSIS — R58 Hemorrhage, not elsewhere classified: Secondary | ICD-10-CM

## 2021-06-05 MED ORDER — MIRTAZAPINE 30 MG PO TABS: 30.0000 mg | ORAL_TABLET | Freq: Every day | ORAL | 0 refills | Status: DC

## 2021-06-05 MED ORDER — PROMETHAZINE HCL 12.5 MG PO TABS: 12.5000 mg | ORAL_TABLET | Freq: Three times a day (TID) | ORAL | 0 refills | Status: DC | PRN
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
## 2021-06-05 NOTE — Assessment & Plan Note (Addendum)
BP elevated today, most likely due to reason for visit. Recommend she monitor BP at home. F/U in 4-6 weeks.
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
## 2021-06-06 ENCOUNTER — Emergency Department: Payer: Commercial Managed Care - HMO

## 2021-06-06 ENCOUNTER — Other Ambulatory Visit: Payer: Self-pay | Admitting: Internal Medicine

## 2021-06-06 ENCOUNTER — Encounter: Payer: Self-pay | Admitting: Medical Oncology

## 2021-06-06 DIAGNOSIS — R519 Headache, unspecified: Secondary | ICD-10-CM | POA: Diagnosis not present

## 2021-06-06 DIAGNOSIS — Z5321 Procedure and treatment not carried out due to patient leaving prior to being seen by health care provider: Secondary | ICD-10-CM | POA: Insufficient documentation

## 2021-06-06 DIAGNOSIS — R1310 Dysphagia, unspecified: Secondary | ICD-10-CM | POA: Diagnosis not present

## 2021-06-06 DIAGNOSIS — T7621XA Adult sexual abuse, suspected, initial encounter: Secondary | ICD-10-CM | POA: Insufficient documentation

## 2021-06-06 DIAGNOSIS — H5789 Other specified disorders of eye and adnexa: Secondary | ICD-10-CM | POA: Diagnosis not present

## 2021-06-06 DIAGNOSIS — S0993XA Unspecified injury of face, initial encounter: Secondary | ICD-10-CM | POA: Diagnosis not present

## 2021-06-06 DIAGNOSIS — S0990XA Unspecified injury of head, initial encounter: Secondary | ICD-10-CM | POA: Diagnosis not present

## 2021-06-06 DIAGNOSIS — R6884 Jaw pain: Secondary | ICD-10-CM | POA: Insufficient documentation

## 2021-06-06 DIAGNOSIS — S4991XA Unspecified injury of right shoulder and upper arm, initial encounter: Secondary | ICD-10-CM | POA: Diagnosis not present

## 2021-06-06 DIAGNOSIS — S199XXA Unspecified injury of neck, initial encounter: Secondary | ICD-10-CM | POA: Diagnosis not present

## 2021-06-06 LAB — URINALYSIS, ROUTINE W REFLEX MICROSCOPIC
Bilirubin Urine: NEGATIVE
Glucose, UA: >=500 mg/dL — AB
Hgb urine dipstick: NEGATIVE
Ketones, ur: 5 mg/dL — AB
Nitrite: POSITIVE — AB
Protein, ur: NEGATIVE mg/dL
Specific Gravity, Urine: 1.029 (ref 1.005–1.030)
WBC, UA: >50 WBC/hpf — ABNORMAL HIGH (ref 0–5)
pH: 5 (ref 5.0–8.0)
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
## 2021-06-06 NOTE — Telephone Encounter (Signed)
Copied from [place] [id number]. Topic: Quick Communication - Rx Refill/Question >> Jun 06, 2021 10:33 AM Leward Quan A wrote: Medication: amLODipine (NORVASC) 2.5 MG tablet  Has the patient contacted their pharmacy? Yes.   (Agent: If no, request that the patient contact the pharmacy for the refill. If patient does not wish to contact the pharmacy document the reason why and proceed with request.) (Agent: If yes, when and what did the pharmacy advise?)  Preferred Pharmacy (with phone number or street name): [facility] [place] - [address]  Phone:  [phone] Fax:  [phone]    Has the patient been seen for an appointment in the last year OR does the patient have an upcoming appointment? Yes.    Agent: Please be advised that RX refills may take up to 3 business days. We ask that you follow-up with your pharmacy.
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
## 2021-06-06 NOTE — SANE Note (Signed)
FNE spoke with Rosemarie Ax with [facility] at approximately 1930.  Patient stated that she has no housing.  FNE asked Rosemarie Ax if she could potentially find housing for patient.  Rosemarie Ax will speak with her colleague and call back with update.
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
## 2021-06-07 ENCOUNTER — Emergency Department
Admission: EM | Admit: 2021-06-07 | Discharge: 2021-06-07 | Disposition: A | Discharge status: Home / Self Care | Payer: Commercial Managed Care - HMO | Attending: Family Medicine | Admitting: Family Medicine

## 2021-06-07 DIAGNOSIS — Z882 Allergy status to sulfonamides status: Secondary | ICD-10-CM | POA: Diagnosis not present

## 2021-06-07 DIAGNOSIS — Z880 Allergy status to penicillin: Secondary | ICD-10-CM | POA: Diagnosis not present

## 2021-06-07 DIAGNOSIS — S40011A Contusion of right shoulder, initial encounter: Secondary | ICD-10-CM | POA: Diagnosis not present

## 2021-06-07 DIAGNOSIS — M79641 Pain in right hand: Secondary | ICD-10-CM | POA: Diagnosis not present

## 2021-06-07 DIAGNOSIS — F17210 Nicotine dependence, cigarettes, uncomplicated: Secondary | ICD-10-CM | POA: Diagnosis not present

## 2021-06-07 DIAGNOSIS — S60221A Contusion of right hand, initial encounter: Secondary | ICD-10-CM | POA: Diagnosis not present

## 2021-06-07 DIAGNOSIS — S0083XA Contusion of other part of head, initial encounter: Secondary | ICD-10-CM | POA: Diagnosis not present

## 2021-06-07 MED ORDER — AMLODIPINE BESYLATE 2.5 MG PO TABS: 2.5000 mg | ORAL_TABLET | Freq: Every day | ORAL | 0 refills | Status: DC

## 2021-06-07 NOTE — Telephone Encounter (Signed)
Requested Prescriptions  Pending Prescriptions Disp Refills   amLODipine (NORVASC) 2.5 MG tablet 90 tablet 0    Sig: Take 1 tablet (2.5 mg total) by mouth daily.     Cardiovascular:  Calcium Channel Blockers Failed - 06/06/2021 12:51 PM      Failed - Last BP in normal range    BP Readings from Last 1 Encounters:  06/05/21 (!) 151/84         Passed - Valid encounter within last 6 months    Recent Outpatient Visits          2 days ago [place], Lauren A, NP   2 weeks ago Viral upper respiratory tract infection   [place], NP   3 weeks ago Need for pneumococcal vaccination   [facility] Vigg, Avanti, MD   1 month ago Epigastric pain   [facility] Vigg, Avanti, MD   1 month ago Need for influenza vaccination   [place], MD      Future Appointments            In 1 month Vigg, Avanti, MD [facility]   In 2 months Vigg, Avanti, MD [facility], [place]

## 2021-06-07 NOTE — Telephone Encounter (Signed)
Refilled 06/07/2021  Requested Prescriptions  Pending Prescriptions Disp Refills   amLODipine (NORVASC) 2.5 MG tablet [Pharmacy Med Name: amLODIPine Besylate 2.5 MG Oral Tablet] 90 tablet 0    Sig: Take 1 tablet by mouth once daily     Cardiovascular:  Calcium Channel Blockers Failed - 06/06/2021 10:40 AM      Failed - Last BP in normal range    BP Readings from Last 1 Encounters:  06/05/21 (!) 151/84         Passed - Valid encounter within last 6 months    Recent Outpatient Visits          2 days ago [place], Lauren A, NP   2 weeks ago Viral upper respiratory tract infection   [place], NP   3 weeks ago Need for pneumococcal vaccination   [facility] Vigg, Avanti, MD   1 month ago Epigastric pain   [facility] Vigg, Avanti, MD   1 month ago Need for influenza vaccination   [place] Vigg, Avanti, MD      Future Appointments            In 1 month Vigg, Avanti, MD [facility]   In 2 months Vigg, Avanti, MD [facility]

## 2021-06-07 NOTE — ED Notes (Signed)
No answer when called from lobby 

## 2021-06-08 LAB — URINE CULTURE

## 2021-06-14 DIAGNOSIS — K2960 Other gastritis without bleeding: Secondary | ICD-10-CM | POA: Insufficient documentation

## 2021-06-14 DIAGNOSIS — R1012 Left upper quadrant pain: Secondary | ICD-10-CM | POA: Diagnosis not present

## 2021-06-14 DIAGNOSIS — K581 Irritable bowel syndrome with constipation: Secondary | ICD-10-CM | POA: Diagnosis not present

## 2021-06-14 DIAGNOSIS — Z86010 Personal history of colonic polyps: Secondary | ICD-10-CM | POA: Diagnosis not present

## 2021-06-27 ENCOUNTER — Other Ambulatory Visit: Payer: Self-pay

## 2021-06-27 ENCOUNTER — Encounter: Payer: Self-pay | Admitting: Internal Medicine

## 2021-06-27 ENCOUNTER — Ambulatory Visit (INDEPENDENT_AMBULATORY_CARE_PROVIDER_SITE_OTHER): Payer: Medicare Other | Admitting: Internal Medicine

## 2021-06-27 VITALS — BP 138/80 | HR 74 | Temp 97.7°F | Ht 63.78 in | Wt 181.2 lb

## 2021-06-27 DIAGNOSIS — E119 Type 2 diabetes mellitus without complications: Secondary | ICD-10-CM

## 2021-06-27 DIAGNOSIS — T7421XD Adult sexual abuse, confirmed, subsequent encounter: Secondary | ICD-10-CM

## 2021-06-27 DIAGNOSIS — T7421XA Adult sexual abuse, confirmed, initial encounter: Secondary | ICD-10-CM | POA: Insufficient documentation

## 2021-06-27 LAB — BAYER DCA HB A1C WAIVED: HB A1C (BAYER DCA - WAIVED): 8.5 % — ABNORMAL HIGH (ref 4.8–5.6)

## 2021-06-27 MED ORDER — ESZOPICLONE 1 MG PO TABS: 1.0000 mg | ORAL_TABLET | Freq: Every evening | ORAL | 0 refills | Status: DC | PRN

## 2021-06-27 MED ORDER — GABAPENTIN 300 MG PO CAPS: 300.0000 mg | ORAL_CAPSULE | Freq: Two times a day (BID) | ORAL | 3 refills | Status: DC

## 2021-06-27 MED ORDER — AMLODIPINE BESYLATE 2.5 MG PO TABS: 2.5000 mg | ORAL_TABLET | Freq: Every day | ORAL | 0 refills | Status: DC

## 2021-06-27 MED ORDER — GABAPENTIN 300 MG PO CAPS: 300.0000 mg | ORAL_CAPSULE | Freq: Two times a day (BID) | ORAL | 0 refills | Status: DC
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
## 2021-06-28 LAB — CBC WITH DIFFERENTIAL/PLATELET
Basophils Absolute: 0 10*3/uL (ref 0.0–0.2)
Basos: 0 %
EOS (ABSOLUTE): 0 10*3/uL (ref 0.0–0.4)
Eos: 1 %
Hematocrit: 42.6 % (ref 34.0–46.6)
Hemoglobin: 13.8 g/dL (ref 11.1–15.9)
Immature Grans (Abs): 0 10*3/uL (ref 0.0–0.1)
Immature Granulocytes: 1 %
Lymphocytes Absolute: 1.1 10*3/uL (ref 0.7–3.1)
Lymphs: 20 %
MCH: 28.8 pg (ref 26.6–33.0)
MCHC: 32.4 g/dL (ref 31.5–35.7)
MCV: 89 fL (ref 79–97)
Monocytes Absolute: 0.4 10*3/uL (ref 0.1–0.9)
Monocytes: 8 %
Neutrophils Absolute: 3.9 10*3/uL (ref 1.4–7.0)
Neutrophils: 70 %
Platelets: 208 10*3/uL (ref 150–450)
RBC: 4.8 x10E6/uL (ref 3.77–5.28)
RDW: 12.4 % (ref 11.7–15.4)
WBC: 5.4 10*3/uL (ref 3.4–10.8)

## 2021-06-28 LAB — COMPREHENSIVE METABOLIC PANEL
ALT: 15 IU/L (ref 0–32)
AST: 15 IU/L (ref 0–40)
Albumin/Globulin Ratio: 1.7 (ref 1.2–2.2)
Albumin: 4 g/dL (ref 3.8–4.8)
Alkaline Phosphatase: 101 IU/L (ref 44–121)
BUN/Creatinine Ratio: 24 (ref 12–28)
BUN: 17 mg/dL (ref 8–27)
Bilirubin Total: <0.2 mg/dL (ref 0.0–1.2)
CO2: 19 mmol/L — ABNORMAL LOW (ref 20–29)
Calcium: 10.3 mg/dL (ref 8.7–10.3)
Chloride: 106 mmol/L (ref 96–106)
Creatinine, Ser: 0.71 mg/dL (ref 0.57–1.00)
Globulin, Total: 2.4 g/dL (ref 1.5–4.5)
Glucose: 282 mg/dL — ABNORMAL HIGH (ref 70–99)
Potassium: 4.4 mmol/L (ref 3.5–5.2)
Sodium: 142 mmol/L (ref 134–144)
Total Protein: 6.4 g/dL (ref 6.0–8.5)
eGFR: 95 mL/min/{1.73_m2} (ref >59)

## 2021-07-11 ENCOUNTER — Other Ambulatory Visit: Payer: Self-pay

## 2021-07-11 MED ORDER — TIZANIDINE HCL 4 MG PO TABS: ORAL_TABLET | ORAL | 0 refills | Status: DC

## 2021-07-11 NOTE — Telephone Encounter (Unsigned)
Copied from [place] [phone]. Topic: Quick Communication - Rx Refill/Question >> Jul 11, 2021  9:00 AM Tessa Lerner A wrote: Medication: tiZANidine (ZANAFLEX) 4 MG tablet [[id number]]   Has the patient contacted their pharmacy? Yes.  The patient ha been directed to contact their PCP (Agent: If no, request that the patient contact the pharmacy for the refill. If patient does not wish to contact the pharmacy document the reason why and proceed with request.) (Agent: If yes, when and what did the pharmacy advise?)  Preferred Pharmacy (with phone number or street name): [facility], [place] [address] Phone: [phone] Fax: [phone]   Has the patient been seen for an appointment in the last year OR does the patient have an upcoming appointment? Yes.    Agent: Please be advised that RX refills may take up to 3 business days. We ask that you follow-up with your pharmacy.
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
## 2021-07-12 ENCOUNTER — Telehealth: Payer: Self-pay

## 2021-07-12 NOTE — Chronic Care Management (AMB) (Signed)
°  Chronic Care Management   Outreach Note  07/12/2021 Name: Lisa Matthews MRN: [id number] DOB: 14-Sep-1955  Lisa Matthews is a 66 y.o. year old female who is a primary care patient of No primary care provider on file.. I reached out to [name] by phone today in response to a referral sent by Ms. Izora Gala Marano's primary care provider.  An unsuccessful telephone outreach was attempted today. The patient was referred to the case management team for assistance with care management and care coordination.   Follow Up Plan: A HIPAA compliant phone message was left for the patient providing contact information and requesting a return call.  The care management team will reach out to the patient again over the next 3 days.  If patient returns call to provider office, please advise to call [place] at [address], [place], [place], [place] [address] Direct Dial: [phone] [email or web address] Website: [email or web address]

## 2021-07-17 NOTE — Chronic Care Management (AMB) (Signed)
Chronic Care Management   Note  07/17/2021 Name: Lailee Hoelzel MRN: [id number] DOB: Dec 10, 1955  Maralee Higuchi is a 66 y.o. year old female who is a primary care patient of No primary care provider on file.. I reached out to [name] by phone today in response to a referral sent by Ms. Izora Gala Reinders's PCP.  Ms. Schraeder was given information about Chronic Care Management services today including:  CCM service includes personalized support from designated clinical staff supervised by her physician, including individualized plan of care and coordination with other care providers 24/7 contact phone numbers for assistance for urgent and routine care needs. Service will only be billed when office clinical staff spend 20 minutes or more in a month to coordinate care. Only one practitioner may furnish and bill the service in a calendar month. The patient may stop CCM services at any time (effective at the end of the month) by phone call to the office staff. The patient is responsible for co-pay (up to 20% after annual deductible is met) if co-pay is required by the individual health plan.   Patient did not agree to enrollment in care management services and does not wish to consider at this time.  Follow up plan: Patient declines engagement by the care management team. Appropriate care team members and provider have been notified via electronic communication.   Noreene Larsson, [address], [place], [place] [address] Direct Dial: [phone] [email or web address] Website: [email or web address]

## 2021-07-18 ENCOUNTER — Ambulatory Visit: Payer: Medicare Other | Admitting: Internal Medicine

## 2021-07-19 ENCOUNTER — Encounter: Payer: Self-pay | Admitting: Pharmacist

## 2021-07-19 NOTE — Progress Notes (Signed)
[facility])                                            [facility] Team                                        Statin Quality Measure Assessment    07/19/2021  Pattiann Solanki [birth year] [id number]  Per review of chart and payor information, patient has a diagnosis of diabetes but is not currently filling a statin prescription.  This places patient into the SUPD (Statin Use In Patients with Diabetes) measure for CMS.    Last prescribed pitavastatin 1 mg 1 tablet PO daily #90 with zero refills. Last filled 04/18/2021. Patient is due for refill and will need a new prescription with refills issued.  Note: Appointment scheduled for 07/20/2021. Will route note to provider.   The 10-year ASCVD risk score (Arnett DK, et al., 2019) is: 28.8%   Values used to calculate the score:     Age: 66 years     Sex: Female     Is Non-Hispanic African American: No     Diabetic: Yes     Tobacco smoker: Yes     Systolic Blood Pressure: 703 mmHg     Is BP treated: Yes     HDL Cholesterol: 47 mg/dL     Total Cholesterol: 217 mg/dL 04/10/2021     Component Value Date/Time   CHOL 217 (H) 04/10/2021 0850   TRIG 219 (H) 04/10/2021 0850   HDL 47 04/10/2021 0850   CHOLHDL 4.7 (H) 01/02/2021 0901   LDLCALC 131 (H) 04/10/2021 5009

## 2021-07-20 ENCOUNTER — Other Ambulatory Visit: Payer: Self-pay

## 2021-07-20 ENCOUNTER — Ambulatory Visit (INDEPENDENT_AMBULATORY_CARE_PROVIDER_SITE_OTHER): Payer: Medicare Other | Admitting: Nurse Practitioner

## 2021-07-20 ENCOUNTER — Encounter: Payer: Self-pay | Admitting: Nurse Practitioner

## 2021-07-20 VITALS — BP 135/83 | HR 64 | Temp 97.6°F | Wt 178.4 lb

## 2021-07-20 DIAGNOSIS — E1169 Type 2 diabetes mellitus with other specified complication: Secondary | ICD-10-CM | POA: Diagnosis not present

## 2021-07-20 DIAGNOSIS — E785 Hyperlipidemia, unspecified: Secondary | ICD-10-CM

## 2021-07-20 DIAGNOSIS — J449 Chronic obstructive pulmonary disease, unspecified: Secondary | ICD-10-CM

## 2021-07-20 DIAGNOSIS — E119 Type 2 diabetes mellitus without complications: Secondary | ICD-10-CM | POA: Diagnosis not present

## 2021-07-20 DIAGNOSIS — I10 Essential (primary) hypertension: Secondary | ICD-10-CM

## 2021-07-20 DIAGNOSIS — Z794 Long term (current) use of insulin: Secondary | ICD-10-CM

## 2021-07-20 DIAGNOSIS — R5383 Other fatigue: Secondary | ICD-10-CM

## 2021-07-20 DIAGNOSIS — E1149 Type 2 diabetes mellitus with other diabetic neurological complication: Secondary | ICD-10-CM | POA: Diagnosis not present

## 2021-07-20 MED ORDER — AMLODIPINE BESYLATE 2.5 MG PO TABS: 2.5000 mg | ORAL_TABLET | Freq: Every day | ORAL | 1 refills | Status: DC

## 2021-07-20 MED ORDER — GABAPENTIN 300 MG PO CAPS: 300.0000 mg | ORAL_CAPSULE | Freq: Four times a day (QID) | ORAL | 1 refills | Status: DC

## 2021-07-20 MED ORDER — OMEPRAZOLE 40 MG PO CPDR: 40.0000 mg | DELAYED_RELEASE_CAPSULE | Freq: Every day | ORAL | 1 refills | Status: DC

## 2021-07-20 MED ORDER — MIRTAZAPINE 30 MG PO TABS: 30.0000 mg | ORAL_TABLET | Freq: Every day | ORAL | 1 refills | Status: DC

## 2021-07-20 MED ORDER — DAPAGLIFLOZIN PROPANEDIOL 10 MG PO TABS: 10.0000 mg | ORAL_TABLET | Freq: Every day | ORAL | 1 refills | Status: AC

## 2021-07-20 MED ORDER — VENLAFAXINE HCL ER 150 MG PO CP24: ORAL_CAPSULE | ORAL | 1 refills | Status: DC

## 2021-07-20 MED ORDER — CYANOCOBALAMIN 1000 MCG/ML IJ SOLN
1000.0000 ug | Freq: Once | INTRAMUSCULAR | Status: AC
  Administered 2021-07-20: 1000 ug via INTRAMUSCULAR

## 2021-07-20 MED ORDER — LINACLOTIDE 145 MCG PO CAPS: 145.0000 ug | ORAL_CAPSULE | Freq: Every day | ORAL | 1 refills | Status: AC

## 2021-07-20 MED ORDER — ESZOPICLONE 1 MG PO TABS: 1.0000 mg | ORAL_TABLET | Freq: Every evening | ORAL | 0 refills | Status: DC | PRN

## 2021-07-20 MED ORDER — FENOFIBRATE 145 MG PO TABS: 145.0000 mg | ORAL_TABLET | Freq: Every day | ORAL | 1 refills | Status: DC

## 2021-07-20 MED ORDER — LOSARTAN POTASSIUM 25 MG PO TABS
25.0000 mg | ORAL_TABLET | Freq: Every day | ORAL | 1 refills | Status: DC
Start: 2021-07-20 — End: 2021-10-22

## 2021-07-20 MED ORDER — ATENOLOL 50 MG PO TABS: 50.0000 mg | ORAL_TABLET | Freq: Every day | ORAL | 1 refills | Status: DC

## 2021-07-20 MED ORDER — METFORMIN HCL 500 MG PO TABS: 500.0000 mg | ORAL_TABLET | Freq: Three times a day (TID) | ORAL | 1 refills | Status: AC

## 2021-07-20 MED ORDER — NYSTATIN 100000 UNIT/GM EX CREA: 1.0000 "application " | TOPICAL_CREAM | Freq: Two times a day (BID) | CUTANEOUS | 1 refills | Status: DC

## 2021-07-20 NOTE — Progress Notes (Addendum)
BP 135/83    Pulse 64    Temp 97.6 F (36.4 C) (Oral)    Wt 178 lb 6.4 oz (80.9 kg)    SpO2 98%    BMI 30.83 kg/m    Subjective:    Patient ID: Lisa Matthews, female    DOB: 05/08/1956, 66 y.o.   MRN: [id number]  HPI: Lisa Matthews is a 66 y.o. female  Chief Complaint  Patient presents with   Hypertension   HYPERTENSION / HYPERLIPIDEMIA Satisfied with current treatment? no Duration of hypertension: years BP monitoring frequency: daily BP range: 160/111 BP medication side effects: no Past BP meds: amlodipine, atenolol, and losartan (cozaar) Duration of hyperlipidemia: years Cholesterol medication side effects: no Cholesterol supplements: none Past cholesterol medications: fenofibrate (tricor) Medication compliance: excellent compliance Aspirin: no Recent stressors: no Recurrent headaches: no Visual changes: no Palpitations: no Dyspnea: no Chest pain: no Lower extremity edema: no Dizzy/lightheaded: yes- almost passing out when she stands up quickly  DIABETES Hypoglycemic episodes:no Polydipsia/polyuria: no Visual disturbance: no Chest pain: no Paresthesias: no Glucose Monitoring: yes  Accucheck frequency: Daily  Fasting glucose: 120  Post prandial:  Evening:  Before meals: Taking Insulin?: no  Long acting insulin:  Short acting insulin: Blood Pressure Monitoring: daily Retinal Examination: Not up to Date Foot Exam:  Up to date Diabetic Education: Not Completed Pneumovax: Up to Date Influenza: Up to Date Aspirin: no  COPD COPD status: controlled Satisfied with current treatment?: yes Oxygen use: no Pneumovax: Up to Date Influenza: Up to Date  Patient states she was given a B12 shot in our office before.  This really helped with her fatigue.  She is wondering if she can get another one.  Patient states she was seeing pain management to get the Oxycodone.     Relevant past medical, surgical, family and social history reviewed and updated as  indicated. Interim medical history since our last visit reviewed. Allergies and medications reviewed and updated.  Review of Systems  Constitutional:  Positive for fatigue.  Eyes:  Negative for visual disturbance.  Respiratory:  Negative for cough, chest tightness and shortness of breath.   Cardiovascular:  Negative for chest pain, palpitations and leg swelling.  Endocrine: Negative for polydipsia and polyuria.  Neurological:  Positive for dizziness. Negative for numbness and headaches.   Per HPI unless specifically indicated above     Objective:    BP 135/83    Pulse 64    Temp 97.6 F (36.4 C) (Oral)    Wt 178 lb 6.4 oz (80.9 kg)    SpO2 98%    BMI 30.83 kg/m   Wt Readings from Last 3 Encounters:  07/20/21 178 lb 6.4 oz (80.9 kg)  06/27/21 181 lb 3.2 oz (82.2 kg)  06/05/21 183 lb 3.2 oz (83.1 kg)    Physical Exam Vitals and nursing note reviewed.  Constitutional:      General: She is not in acute distress.    Appearance: Normal appearance. She is normal weight. She is not ill-appearing, toxic-appearing or diaphoretic.  HENT:     Head: Normocephalic.     Right Ear: External ear normal.     Left Ear: External ear normal.     Nose: Nose normal.     Mouth/Throat:     Mouth: Mucous membranes are moist.     Pharynx: Oropharynx is clear.  Eyes:     General:        Right eye: No discharge.  Left eye: No discharge.     Extraocular Movements: Extraocular movements intact.     Conjunctiva/sclera: Conjunctivae normal.     Pupils: Pupils are equal, round, and reactive to light.  Cardiovascular:     Rate and Rhythm: Normal rate and regular rhythm.     Heart sounds: No murmur heard. Pulmonary:     Effort: Pulmonary effort is normal. No respiratory distress.     Breath sounds: Normal breath sounds. No wheezing or rales.  Musculoskeletal:     Cervical back: Normal range of motion and neck supple.  Skin:    General: Skin is warm and dry.     Capillary Refill: Capillary  refill takes less than 2 seconds.  Neurological:     General: No focal deficit present.     Mental Status: She is alert and oriented to person, place, and time. Mental status is at baseline.  Psychiatric:        Mood and Affect: Mood normal.        Behavior: Behavior normal.        Thought Content: Thought content normal.        Judgment: Judgment normal.    Results for orders placed or performed in visit on 06/27/21  CBC with Differential/Platelet  Result Value Ref Range   WBC 5.4 3.4 - 10.8 x10E3/uL   RBC 4.80 3.77 - 5.28 x10E6/uL   Hemoglobin 13.8 11.1 - 15.9 g/dL   Hematocrit 42.6 34.0 - 46.6 %   MCV 89 79 - 97 fL   MCH 28.8 26.6 - 33.0 pg   MCHC 32.4 31.5 - 35.7 g/dL   RDW 12.4 11.7 - 15.4 %   Platelets 208 150 - 450 x10E3/uL   Neutrophils 70 Not Estab. %   Lymphs 20 Not Estab. %   Monocytes 8 Not Estab. %   Eos 1 Not Estab. %   Basos 0 Not Estab. %   Neutrophils Absolute 3.9 1.4 - 7.0 x10E3/uL   Lymphocytes Absolute 1.1 0.7 - 3.1 x10E3/uL   Monocytes Absolute 0.4 0.1 - 0.9 x10E3/uL   EOS (ABSOLUTE) 0.0 0.0 - 0.4 x10E3/uL   Basophils Absolute 0.0 0.0 - 0.2 x10E3/uL   Immature Granulocytes 1 Not Estab. %   Immature Grans (Abs) 0.0 0.0 - 0.1 x10E3/uL  Comprehensive metabolic panel  Result Value Ref Range   Glucose 282 (H) 70 - 99 mg/dL   BUN 17 8 - 27 mg/dL   Creatinine, Ser 0.71 0.57 - 1.00 mg/dL   eGFR 95 >59 mL/min/1.73   BUN/Creatinine Ratio 24 12 - 28   Sodium 142 134 - 144 mmol/L   Potassium 4.4 3.5 - 5.2 mmol/L   Chloride 106 96 - 106 mmol/L   CO2 19 (L) 20 - 29 mmol/L   Calcium 10.3 8.7 - 10.3 mg/dL   Total Protein 6.4 6.0 - 8.5 g/dL   Albumin 4.0 3.8 - 4.8 g/dL   Globulin, Total 2.4 1.5 - 4.5 g/dL   Albumin/Globulin Ratio 1.7 1.2 - 2.2   Bilirubin Total <0.2 0.0 - 1.2 mg/dL   Alkaline Phosphatase 101 44 - 121 IU/L   AST 15 0 - 40 IU/L   ALT 15 0 - 32 IU/L  Bayer DCA Hb A1c Waived (STAT)  Result Value Ref Range   HB A1C (BAYER DCA - WAIVED) 8.5 (H)  4.8 - 5.6 %      Assessment & Plan:   Problem List Items Addressed This Visit       Cardiovascular  and Mediastinum   Essential hypertension    Chronic.  Controlled.  Continue with current medication regimen of Atenolol 29m, Losartan 267mand Amlodipine 2.67m67m Labs reviewed from last visit.  Return to clinic in 3 months for reevaluation.  Call sooner if concerns arise.        Relevant Medications   atenolol (TENORMIN) 50 MG tablet   losartan (COZAAR) 25 MG tablet   amLODipine (NORVASC) 2.5 MG tablet   fenofibrate (TRICOR) 145 MG tablet     Respiratory   Obstructive chronic bronchitis (HCC) - Primary    Chronic. Well controlled per patient.  Will continue to assess at future visits.  Patient is no longer a smoker. Quit in 2007.  Follow up in 3 months for reevaluation.        Endocrine   Type 2 diabetes mellitus, with long-term current use of insulin (HCC)    Chronic.  Controlled.  Continue with current medication regimen of Metformin 500m32mD and Farxiga 10mg23mviewed labs from last visit.  Last A1c was 8.5. Has yeast infection at this time.  Will treat with Nystatin.  If persistent may need to take patient off FarxiIranturn to clinic in 3 months for reevaluation.  Call sooner if concerns arise.       Relevant Medications   losartan (COZAAR) 25 MG tablet   dapagliflozin propanediol (FARXIGA) 10 MG TABS tablet   metFORMIN (GLUCOPHAGE) 500 MG tablet   Type 2 diabetes mellitus (HCC)    Chronic.  Controlled.  Continue with current medication regimen of Metformin 500mg 27mand Farxiga 10mg. 70mewed labs from last visit.  Last A1c was 8.5. Has yeast infection at this time.  Will treat with Nystatin.  If persistent may need to take patient off FarxigaIranrn to clinic in 3 months for reevaluation.  Call sooner if concerns arise.        Relevant Medications   losartan (COZAAR) 25 MG tablet   dapagliflozin propanediol (FARXIGA) 10 MG TABS tablet   metFORMIN (GLUCOPHAGE) 500  MG tablet   Hyperlipidemia associated with type 2 diabetes mellitus (HCC)    Chronic.  Controlled.  Continue with current medication regimen of Fenofibrate 1467mg.  28mewed lab work from last visit.  Does not tolerate statins.  Return to clinic in 3 months for reevaluation.  Call sooner if concerns arise.        Relevant Medications   atenolol (TENORMIN) 50 MG tablet   losartan (COZAAR) 25 MG tablet   amLODipine (NORVASC) 2.5 MG tablet   dapagliflozin propanediol (FARXIGA) 10 MG TABS tablet   fenofibrate (TRICOR) 145 MG tablet   metFORMIN (GLUCOPHAGE) 500 MG tablet   RESOLVED: Diabetes mellitus without complication (HCC)   Relevant Medications   losartan (COZAAR) 25 MG tablet   dapagliflozin propanediol (FARXIGA) 10 MG TABS tablet   fenofibrate (TRICOR) 145 MG tablet   metFORMIN (GLUCOPHAGE) 500 MG tablet   Other Visit Diagnoses     Other fatigue       B12 injection given in office today.  Follow up in 3 months for reevaluation.   Relevant Medications   cyanocobalamin ((VITAMIN B-12)) injection 1,000 mcg (Completed)        Follow up plan: Return in about 3 months (around 10/17/2021) for HTN, HLD, DM2 FU.

## 2021-07-20 NOTE — Assessment & Plan Note (Signed)
Chronic.  Controlled.  Continue with current medication regimen of Metformin 500mg  TID and Farxiga 10mg . Reviewed labs from last visit.  Last A1c was 8.5. Has yeast infection at this time.  Will treat with Nystatin.  If persistent may need to take patient off [place].  Return to clinic in 3 months for reevaluation.  Call sooner if concerns arise.

## 2021-07-20 NOTE — Assessment & Plan Note (Signed)
Chronic.  Controlled.  Continue with current medication regimen of Fenofibrate 145mg .  Reviewed lab work from last visit.  Does not tolerate statins.  Return to clinic in 3 months for reevaluation.  Call sooner if concerns arise.

## 2021-07-20 NOTE — Assessment & Plan Note (Signed)
Chronic.  Controlled.  Continue with current medication regimen of Atenolol 50mg , Losartan 25mg  and Amlodipine 2.5mg .  Labs reviewed from last visit.  Return to clinic in 3 months for reevaluation.  Call sooner if concerns arise.

## 2021-07-20 NOTE — Assessment & Plan Note (Signed)
Chronic. Well controlled per patient.  Will continue to assess at future visits.  Patient is no longer a smoker. Quit in 2007.  Follow up in 3 months for reevaluation.

## 2021-07-26 ENCOUNTER — Telehealth: Payer: Self-pay | Admitting: Nurse Practitioner

## 2021-07-26 MED ORDER — LEVEMIR FLEXPEN 100 UNIT/ML ~~LOC~~ SOPN: 10.0000 [IU] | PEN_INJECTOR | Freq: Every day | SUBCUTANEOUS | 1 refills | Status: AC

## 2021-07-26 MED ORDER — "PEN NEEDLES 3/16"" 31G X 5 MM MISC": 1.0000 "application " | Freq: Every day | 1 refills | Status: AC

## 2021-07-26 NOTE — Telephone Encounter (Signed)
Patient says she has been without Lantus insulin since September and she was taking about 10 units 3 times a day. Patient states she was on Lantus, but did not want that medication anymore as she didn't like it and was requesting a prescription for Nova Log Pen. Please advise?

## 2021-07-26 NOTE — Telephone Encounter (Signed)
Spoke with patient and made her aware of Karen's recommendations and she states she would like a long acting insulin to help with lowering her sugar levels. Please advise?

## 2021-07-26 NOTE — Telephone Encounter (Signed)
Copied from [place] [id number]. Topic: General - Inquiry >> Jul 26, 2021  9:51 AM Greggory Keen D wrote: Reason for CRM: Pt called wanting to switch to the [facility]  CB#  [phone]

## 2021-07-26 NOTE — Telephone Encounter (Signed)
Please call patient and get more information. I have that she was on Lantus but it was discontinued back in September.  Is she currently using insulin, if so, what dose and what kind?

## 2021-07-26 NOTE — Telephone Encounter (Signed)
Novolog is a fasting acting insulin and Lantus is a short acting insulin.  They are no comparable.  If patient would like a different type of long acting insulin I can send that in but I am not going to change it to a short acting like Novolog.

## 2021-07-26 NOTE — Telephone Encounter (Signed)
Please let patient know that I sent Levemir Pen to the pharmacy for her.

## 2021-08-15 ENCOUNTER — Ambulatory Visit: Payer: Medicare Other | Admitting: Internal Medicine

## 2021-10-02 DIAGNOSIS — M79604 Pain in right leg: Secondary | ICD-10-CM | POA: Diagnosis not present

## 2021-10-02 DIAGNOSIS — M5136 Other intervertebral disc degeneration, lumbar region: Secondary | ICD-10-CM | POA: Diagnosis not present

## 2021-10-02 DIAGNOSIS — Z79891 Long term (current) use of opiate analgesic: Secondary | ICD-10-CM | POA: Diagnosis not present

## 2021-10-02 DIAGNOSIS — M47816 Spondylosis without myelopathy or radiculopathy, lumbar region: Secondary | ICD-10-CM | POA: Diagnosis not present

## 2021-10-04 ENCOUNTER — Ambulatory Visit (INDEPENDENT_AMBULATORY_CARE_PROVIDER_SITE_OTHER): Payer: Medicare Other | Admitting: Nurse Practitioner

## 2021-10-04 ENCOUNTER — Encounter: Payer: Self-pay | Admitting: Nurse Practitioner

## 2021-10-04 VITALS — BP 120/79 | HR 63 | Temp 97.5°F | Wt 186.0 lb

## 2021-10-04 DIAGNOSIS — E1169 Type 2 diabetes mellitus with other specified complication: Secondary | ICD-10-CM

## 2021-10-04 DIAGNOSIS — E785 Hyperlipidemia, unspecified: Secondary | ICD-10-CM

## 2021-10-04 DIAGNOSIS — Z794 Long term (current) use of insulin: Secondary | ICD-10-CM

## 2021-10-04 DIAGNOSIS — N3000 Acute cystitis without hematuria: Secondary | ICD-10-CM

## 2021-10-04 DIAGNOSIS — R8290 Unspecified abnormal findings in urine: Secondary | ICD-10-CM | POA: Diagnosis not present

## 2021-10-04 DIAGNOSIS — J449 Chronic obstructive pulmonary disease, unspecified: Secondary | ICD-10-CM

## 2021-10-04 DIAGNOSIS — J4489 Other specified chronic obstructive pulmonary disease: Secondary | ICD-10-CM

## 2021-10-04 DIAGNOSIS — E1149 Type 2 diabetes mellitus with other diabetic neurological complication: Secondary | ICD-10-CM

## 2021-10-04 DIAGNOSIS — I10 Essential (primary) hypertension: Secondary | ICD-10-CM

## 2021-10-04 DIAGNOSIS — E559 Vitamin D deficiency, unspecified: Secondary | ICD-10-CM | POA: Diagnosis not present

## 2021-10-04 DIAGNOSIS — Z1159 Encounter for screening for other viral diseases: Secondary | ICD-10-CM | POA: Diagnosis not present

## 2021-10-04 DIAGNOSIS — Z114 Encounter for screening for human immunodeficiency virus [HIV]: Secondary | ICD-10-CM

## 2021-10-04 DIAGNOSIS — R300 Dysuria: Secondary | ICD-10-CM | POA: Diagnosis not present

## 2021-10-04 DIAGNOSIS — B379 Candidiasis, unspecified: Secondary | ICD-10-CM | POA: Diagnosis not present

## 2021-10-04 LAB — URINALYSIS, ROUTINE W REFLEX MICROSCOPIC
Bilirubin, UA: NEGATIVE
Ketones, UA: NEGATIVE
Leukocytes,UA: NEGATIVE
Nitrite, UA: POSITIVE — AB
Protein,UA: NEGATIVE
RBC, UA: NEGATIVE
Specific Gravity, UA: 1.02 (ref 1.005–1.030)
Urobilinogen, Ur: 0.2 mg/dL (ref 0.2–1.0)
pH, UA: 6.5 (ref 5.0–7.5)

## 2021-10-04 LAB — MICROSCOPIC EXAMINATION

## 2021-10-04 LAB — WET PREP FOR TRICH, YEAST, CLUE
Clue Cell Exam: NEGATIVE
Trichomonas Exam: NEGATIVE
Yeast Exam: POSITIVE — AB

## 2021-10-04 MED ORDER — CEFUROXIME AXETIL 250 MG PO TABS: 250.0000 mg | ORAL_TABLET | Freq: Two times a day (BID) | ORAL | 0 refills | Status: AC

## 2021-10-04 MED ORDER — NYSTATIN 100000 UNIT/GM EX CREA: 1.0000 "application " | TOPICAL_CREAM | Freq: Two times a day (BID) | CUTANEOUS | 1 refills | Status: DC

## 2021-10-04 NOTE — Assessment & Plan Note (Signed)
Chronic.  Controlled.  Continue with current medication regimen of Fenofibrate '145mg'$ .  Labs ordered. Will make recommendations based on lab results.  Does not tolerate statins.  Return to clinic in 3 months for reevaluation.  Call sooner if concerns arise.  ? ?

## 2021-10-04 NOTE — Assessment & Plan Note (Signed)
Labs ordered today.  Will make recommendations based on lab results. ?

## 2021-10-04 NOTE — Assessment & Plan Note (Signed)
Chronic. Well controlled per patient.  Will continue to assess at future visits.  Patient is no longer a smoker. Quit in 2007.  Follow up in 3 months for reevaluation.  Call sooner if concerns arise. ?

## 2021-10-04 NOTE — Assessment & Plan Note (Signed)
Chronic.  Controlled.  Continue with current medication regimen of Metformin '500mg'$  TID and Farxiga '10mg'$ .  Last A1c was 8.5. Will make further recommendations based on lab results. Return to clinic in 3 months for reevaluation.  Call sooner if concerns arise.  ?

## 2021-10-04 NOTE — Progress Notes (Signed)
? ?BP 120/79 Comment: home blood pressure reading  Pulse 63   Temp (!) 97.5 ?F (36.4 ?C) (Oral)   Wt 186 lb (84.4 kg)   SpO2 97%   BMI 32.15 kg/m?   ? ?Subjective:  ? ? Patient ID: Lisa Matthews, female    DOB: 01/07/1956, 66 y.o.   MRN: [id number] ? ?HPI: ?Lisa Matthews is a 66 y.o. female ? ?Chief Complaint  ?Patient presents with  ? Hypertension  ? Hyperlipidemia  ? Diabetes  ?  Pt states she has dysuria and urinary frequency x 1 week.  ? ?Would like refill on nystatin cream   ? ?HYPERTENSION / HYPERLIPIDEMIA ?Satisfied with current treatment? no ?Duration of hypertension: years ?BP monitoring frequency: daily ?BP range: 120-130/70-80 ?BP medication side effects: no ?Past BP meds: amlodipine, atenolol, and losartan (cozaar) ?Duration of hyperlipidemia: years ?Cholesterol medication side effects: no ?Cholesterol supplements: none ?Past cholesterol medications: fenofibrate (tricor) ?Medication compliance: excellent compliance ?Aspirin: no ?Recent stressors: no ?Recurrent headaches: no ?Visual changes: no ?Palpitations: no ?Dyspnea: no ?Chest pain: no ?Lower extremity edema: no ?Dizzy/lightheaded: no ? ?DIABETES ?Hypoglycemic episodes:no ?Polydipsia/polyuria: no ?Visual disturbance: no ?Chest pain: no ?Paresthesias: no ?Glucose Monitoring: yes ? Accucheck frequency: Daily ? Fasting glucose: 120 ? Post prandial: ? Evening: ? Before meals: ?Taking Insulin?: no ? Long acting insulin: ? Short acting insulin: ?Blood Pressure Monitoring: daily ?Retinal Examination: Not up to Date ?Foot Exam:  Up to date ?Diabetic Education: Not Completed ?Pneumovax: Up to Date ?Influenza: Up to Date ?Aspirin: no ? ?COPD ?COPD status: controlled ?Satisfied with current treatment?: yes ?Oxygen use: no ?Pneumovax: Up to Date ?Influenza: Up to Date ? ?Patient states she is having a lot of burning and itching in vaginal area.  Symptoms have been going on for a week.  She tried monistat and that didn't help.  Yellow vaginal discharge and  foul odor.  Patient is occasionally sexually active.  ? ?Patient states she has a spider bite that has been there for a week and very itchy. It is healing.   ?  ? ? ?Relevant past medical, surgical, family and social history reviewed and updated as indicated. Interim medical history since our last visit reviewed. ?Allergies and medications reviewed and updated. ? ?Review of Systems  ?Eyes:  Negative for visual disturbance.  ?Respiratory:  Negative for cough, chest tightness and shortness of breath.   ?Cardiovascular:  Negative for chest pain, palpitations and leg swelling.  ?Endocrine: Negative for polydipsia and polyuria.  ?Genitourinary:  Positive for dysuria and vaginal discharge.  ?Neurological:  Negative for dizziness, numbness and headaches.  ? ?Per HPI unless specifically indicated above ? ?   ?Objective:  ?  ?BP 120/79 Comment: home blood pressure reading  Pulse 63   Temp (!) 97.5 ?F (36.4 ?C) (Oral)   Wt 186 lb (84.4 kg)   SpO2 97%   BMI 32.15 kg/m?   ?Wt Readings from Last 3 Encounters:  ?10/04/21 186 lb (84.4 kg)  ?07/20/21 178 lb 6.4 oz (80.9 kg)  ?06/27/21 181 lb 3.2 oz (82.2 kg)  ?  ?Physical Exam ?Vitals and nursing note reviewed.  ?Constitutional:   ?   General: She is not in acute distress. ?   Appearance: Normal appearance. She is normal weight. She is not ill-appearing, toxic-appearing or diaphoretic.  ?HENT:  ?   Head: Normocephalic.  ?   Right Ear: External ear normal.  ?   Left Ear: External ear normal.  ?   Nose: Nose normal.  ?  Mouth/Throat:  ?   Mouth: Mucous membranes are moist.  ?   Pharynx: Oropharynx is clear.  ?Eyes:  ?   General:     ?   Right eye: No discharge.     ?   Left eye: No discharge.  ?   Extraocular Movements: Extraocular movements intact.  ?   Conjunctiva/sclera: Conjunctivae normal.  ?   Pupils: Pupils are equal, round, and reactive to light.  ?Cardiovascular:  ?   Rate and Rhythm: Normal rate and regular rhythm.  ?   Heart sounds: No murmur heard. ?Pulmonary:  ?    Effort: Pulmonary effort is normal. No respiratory distress.  ?   Breath sounds: Normal breath sounds. No wheezing or rales.  ?Musculoskeletal:  ?   Cervical back: Normal range of motion and neck supple.  ?Skin: ?   General: Skin is warm and dry.  ?   Capillary Refill: Capillary refill takes less than 2 seconds.  ?Neurological:  ?   General: No focal deficit present.  ?   Mental Status: She is alert and oriented to person, place, and time. Mental status is at baseline.  ?Psychiatric:     ?   Mood and Affect: Mood normal.     ?   Behavior: Behavior normal.     ?   Thought Content: Thought content normal.     ?   Judgment: Judgment normal.  ? ? ?Results for orders placed or performed in visit on 10/04/21  ?Microscopic Examination  ?Result Value Ref Range  ? WBC, UA 0-5 0 - 5 /hpf  ? RBC 0-2 0 - 2 /hpf  ? Epithelial Cells (non renal) 0-10 0 - 10 /hpf  ? Bacteria, UA Many (A) None seen/Few  ?WET PREP FOR [place], YEAST, CLUE  ? Sterile Swab  ?Result Value Ref Range  ? Trichomonas Exam Negative Negative  ? Yeast Exam Positive (A) Negative  ? Clue Cell Exam Negative Negative  ?Urinalysis, Routine w reflex microscopic  ?Result Value Ref Range  ? Specific Gravity, UA 1.020 1.005 - 1.030  ? pH, UA 6.5 5.0 - 7.5  ? Color, UA Yellow Yellow  ? Appearance Ur Clear Clear  ? Leukocytes,UA Negative Negative  ? Protein,UA Negative Negative/Trace  ? Glucose, UA 3+ (A) Negative  ? Ketones, UA Negative Negative  ? RBC, UA Negative Negative  ? Bilirubin, UA Negative Negative  ? Urobilinogen, Ur 0.2 0.2 - 1.0 mg/dL  ? Nitrite, UA Positive (A) Negative  ? Microscopic Examination See below:   ? ?   ?Assessment & Plan:  ? ?Problem List Items Addressed This Visit   ? ?  ? Cardiovascular and Mediastinum  ? Essential hypertension  ?  Chronic.  Controlled.  Continue with current medication regimen of Atenolol 81m, Losartan 241mand Amlodipine 2.11m311m Labs ordered.  Return to clinic in 3 months for reevaluation.  Call sooner if concerns arise.   ? ? ?  ?  ? Relevant Orders  ? Comp Met (CMET)  ?  ? Respiratory  ? Obstructive chronic bronchitis ([facility]  ?  Chronic. Well controlled per patient.  Will continue to assess at future visits.  Patient is no longer a smoker. Quit in 2007.  Follow up in 3 months for reevaluation.  Call sooner if concerns arise. ? ?  ?  ?  ? Endocrine  ? Type 2 diabetes mellitus, with long-term current use of insulin ([facility]?  Chronic.  Controlled.  Continue  with current medication regimen of Metformin 580m TID and Farxiga 1106m  Last A1c was 8.5. Will make further recommendations based on lab results. Return to clinic in 3 months for reevaluation.  Call sooner if concerns arise.  ?  ?  ? Relevant Orders  ? HgB A1c  ? Hyperlipidemia associated with type 2 diabetes mellitus ([facility] ?  Chronic.  Controlled.  Continue with current medication regimen of Fenofibrate 14567m Labs ordered. Will make recommendations based on lab results.  Does not tolerate statins.  Return to clinic in 3 months for reevaluation.  Call sooner if concerns arise.  ? ? ?  ?  ? Relevant Orders  ? Lipid Profile  ?  ? Other  ? Vitamin D deficiency, unspecified  ?  Labs ordered today. Will make recommendations based on lab results.  ? ?  ?  ? Relevant Orders  ? Vitamin D (25 hydroxy)  ? ?Other Visit Diagnoses   ? ? Dysuria      ? Relevant Orders  ? WET PREP FOR [place], CLUE  ? Urinalysis, Routine w reflex microscopic (Completed)  ? Encounter for hepatitis C screening test for low risk patient      ? Relevant Orders  ? Hepatitis C Antibody  ? Screening for HIV (human immunodeficiency virus)      ? Relevant Orders  ? HIV Antibody (routine testing w rflx)  ? Abnormal urinalysis      ? Relevant Orders  ? Urine Culture  ? Acute cystitis without hematuria      ? UA positive for Nitrites. Will treat with cefuroximine due to allergy list.  FU if symptoms do not improve.  ? Yeast infection      ? Will treat with nystatin cream due to allergy to fluconazole.   ?  Relevant Medications  ? nystatin cream (MYCOSTATIN)  ? cefUROXime (CEFTIN) 250 MG tablet  ? ?  ?  ? ?Follow up plan: ?Return in about 3 months (around 01/04/2022) for Physical and Fasting labs. ? ? ? ? ? ?

## 2021-10-04 NOTE — Assessment & Plan Note (Signed)
Chronic.  Controlled.  Continue with current medication regimen of Atenolol '50mg'$ , Losartan '25mg'$  and Amlodipine 2.'5mg'$ .  Labs ordered.  Return to clinic in 3 months for reevaluation.  Call sooner if concerns arise.  ? ?

## 2021-10-05 LAB — LIPID PANEL
Chol/HDL Ratio: 4.8 ratio — ABNORMAL HIGH (ref 0.0–4.4)
Cholesterol, Total: 218 mg/dL — ABNORMAL HIGH (ref 100–199)
HDL: 45 mg/dL (ref >39)
LDL Chol Calc (NIH): 110 mg/dL — ABNORMAL HIGH (ref 0–99)
Triglycerides: 366 mg/dL — ABNORMAL HIGH (ref 0–149)
VLDL Cholesterol Cal: 63 mg/dL — ABNORMAL HIGH (ref 5–40)

## 2021-10-05 LAB — COMPREHENSIVE METABOLIC PANEL
ALT: 12 IU/L (ref 0–32)
AST: 17 IU/L (ref 0–40)
Albumin/Globulin Ratio: 1.7 (ref 1.2–2.2)
Albumin: 4 g/dL (ref 3.8–4.8)
Alkaline Phosphatase: 96 IU/L (ref 44–121)
BUN/Creatinine Ratio: 20 (ref 12–28)
BUN: 20 mg/dL (ref 8–27)
Bilirubin Total: <0.2 mg/dL (ref 0.0–1.2)
CO2: 21 mmol/L (ref 20–29)
Calcium: 10.5 mg/dL — ABNORMAL HIGH (ref 8.7–10.3)
Chloride: 107 mmol/L — ABNORMAL HIGH (ref 96–106)
Creatinine, Ser: 1 mg/dL (ref 0.57–1.00)
Globulin, Total: 2.4 g/dL (ref 1.5–4.5)
Glucose: 284 mg/dL — ABNORMAL HIGH (ref 70–99)
Potassium: 4.2 mmol/L (ref 3.5–5.2)
Sodium: 142 mmol/L (ref 134–144)
Total Protein: 6.4 g/dL (ref 6.0–8.5)
eGFR: 63 mL/min/{1.73_m2} (ref >59)

## 2021-10-05 LAB — HEMOGLOBIN A1C
Est. average glucose Bld gHb Est-mCnc: 206 mg/dL
Hgb A1c MFr Bld: 8.8 % — ABNORMAL HIGH (ref 4.8–5.6)

## 2021-10-05 LAB — VITAMIN D 25 HYDROXY (VIT D DEFICIENCY, FRACTURES): Vit D, 25-Hydroxy: 36.9 ng/mL (ref 30.0–100.0)

## 2021-10-05 LAB — HEPATITIS C ANTIBODY: Hep C Virus Ab: NONREACTIVE

## 2021-10-05 LAB — HIV ANTIBODY (ROUTINE TESTING W REFLEX): HIV Screen 4th Generation wRfx: NONREACTIVE

## 2021-10-05 NOTE — Progress Notes (Signed)
Please let patient know that her lab work shows that her A1c increased to 8.8.  I would like her to increase her insulin by 2 units every other day until her blood sugars are less than 150.  Her cholesterol remains elevated.  I recommend a low fat diet and decreasing carbohydrates.   ? ?Her other lab work showed that she had a UTI and yeast infection. I sent in medications to treat both of those to [facility].   ? ?Please let me know if she has any questions. I will see her at our next visit.

## 2021-10-08 LAB — URINE CULTURE

## 2021-10-08 NOTE — Progress Notes (Signed)
Patient already being treated with antibiotics.

## 2021-10-16 DIAGNOSIS — M25551 Pain in right hip: Secondary | ICD-10-CM | POA: Diagnosis not present

## 2021-10-16 DIAGNOSIS — M47816 Spondylosis without myelopathy or radiculopathy, lumbar region: Secondary | ICD-10-CM | POA: Diagnosis not present

## 2021-10-16 DIAGNOSIS — Z79891 Long term (current) use of opiate analgesic: Secondary | ICD-10-CM | POA: Diagnosis not present

## 2021-10-16 DIAGNOSIS — Z5181 Encounter for therapeutic drug level monitoring: Secondary | ICD-10-CM | POA: Diagnosis not present

## 2021-10-16 DIAGNOSIS — M5136 Other intervertebral disc degeneration, lumbar region: Secondary | ICD-10-CM | POA: Diagnosis not present

## 2021-10-16 DIAGNOSIS — G894 Chronic pain syndrome: Secondary | ICD-10-CM | POA: Diagnosis not present

## 2021-10-17 ENCOUNTER — Ambulatory Visit: Payer: Medicare Other | Admitting: Nurse Practitioner

## 2021-10-22 ENCOUNTER — Ambulatory Visit: Payer: Self-pay

## 2021-10-22 ENCOUNTER — Encounter: Payer: Self-pay | Admitting: Nurse Practitioner

## 2021-10-22 ENCOUNTER — Ambulatory Visit (INDEPENDENT_AMBULATORY_CARE_PROVIDER_SITE_OTHER): Payer: Medicare Other | Admitting: Nurse Practitioner

## 2021-10-22 VITALS — BP 146/73 | HR 57 | Temp 98.2°F | Wt 187.4 lb

## 2021-10-22 DIAGNOSIS — B379 Candidiasis, unspecified: Secondary | ICD-10-CM | POA: Diagnosis not present

## 2021-10-22 DIAGNOSIS — N3000 Acute cystitis without hematuria: Secondary | ICD-10-CM

## 2021-10-22 DIAGNOSIS — R300 Dysuria: Secondary | ICD-10-CM | POA: Diagnosis not present

## 2021-10-22 LAB — MICROSCOPIC EXAMINATION: RBC, Urine: NONE SEEN /hpf (ref 0–2)

## 2021-10-22 LAB — URINALYSIS, ROUTINE W REFLEX MICROSCOPIC
Bilirubin, UA: NEGATIVE
Ketones, UA: NEGATIVE
Nitrite, UA: NEGATIVE
Protein,UA: NEGATIVE
RBC, UA: NEGATIVE
Specific Gravity, UA: 1.02 (ref 1.005–1.030)
Urobilinogen, Ur: 0.2 mg/dL (ref 0.2–1.0)
pH, UA: 6 (ref 5.0–7.5)

## 2021-10-22 LAB — WET PREP FOR TRICH, YEAST, CLUE
Clue Cell Exam: NEGATIVE
Trichomonas Exam: NEGATIVE
Yeast Exam: NEGATIVE

## 2021-10-22 MED ORDER — CLOTRIMAZOLE-BETAMETHASONE 1-0.05 % EX CREA: 1.0000 "application " | TOPICAL_CREAM | Freq: Every day | CUTANEOUS | 0 refills | Status: DC

## 2021-10-22 MED ORDER — CEPHALEXIN 500 MG PO CAPS: 500.0000 mg | ORAL_CAPSULE | Freq: Four times a day (QID) | ORAL | 0 refills | Status: DC

## 2021-10-22 MED ORDER — LOSARTAN POTASSIUM 25 MG PO TABS: 25.0000 mg | ORAL_TABLET | Freq: Every day | ORAL | 1 refills | Status: AC

## 2021-10-22 NOTE — Progress Notes (Signed)
Results discussed with patient during visit.

## 2021-10-22 NOTE — Telephone Encounter (Signed)
  Chief Complaint: UTI, Vaginal itchin Symptoms: mild constant back pain, urinary frequency, burning, urgency,  Vagina: yellow discharge itching Frequency: since 10/04/21 Pertinent Negatives: Patient denies fever, blood in urine,  Disposition: '[]'$ ED /'[]'$ Urgent Care (no appt availability in office) / '[x]'$ Appointment(In office/virtual)/ '[]'$  [place] [facility]/ '[]'$ Home Care/ '[]'$ Refused Recommended Disposition /'[]'$  Mobile Bus/ '[]'$  Follow-up with PCP Additional Notes: has appt with PCP at 1620  Please review if appt time is ok        Reason for Disposition  Side (flank) or lower back pain present  Answer Assessment - Initial Assessment Questions 1. SEVERITY: "How bad is the pain?"  (e.g., Scale 1-10; mild, moderate, or severe)   - MILD (1-3): complains slightly about urination hurting   - MODERATE (4-7): interferes with normal activities     - SEVERE (8-10): excruciating, unwilling or unable to urinate because of the pain      Moderate urination pain   back pain:mild comstant 2. FREQUENCY: "How many times have you had painful urination today?"     Every void since 10/04/21 3. PATTERN: "Is pain present every time you urinate or just sometimes?"      Everytime voids 4. ONSET: "When did the painful urination start?"      Since last OV 5. FEVER: "Do you have a fever?" If Yes, ask: "What is your temperature, how*No Answer*no was it measured, and when did it start?" NO fever      6. PAST UTI: "Have you had a urine infection before?" If Yes, ask: "When was the last time?" and "What happened that time?"      Yes abx  7. CAUSE: "What do you think is causing the painful urination?"  (e.g., UTI, scratch, Herpes sore)     UTI, vaginal rash 8. OTHER SYMPTOMS: "Do you have any other symptoms?" (e.g., flank pain, vaginal discharge, genital sores, urgency, blood in urine)     Itching, back pain, burning, uregentcy frewncy of urination, vagina discharge 9. PREGNANCY: "Is there any chance you are  pregnant?" "When was your last menstrual period?"     N/a  Protocols used: Urination Pain - Female-A-AH

## 2021-10-22 NOTE — Progress Notes (Signed)
BP (!) 146/73   Pulse (!) 57   Temp 98.2 F (36.8 C) (Oral)   Wt 187 lb 6.4 oz (85 kg)   SpO2 93%   BMI 32.39 kg/m    Subjective:    Patient ID: Lisa Matthews, female    DOB: [birth year], 66 y.o.   MRN: [id number]  HPI: Lisa Matthews is a 66 y.o. female  Chief Complaint  Patient presents with   Dysuria    Onset 2 weeks ago  Pt requesting refills on losartan   URINARY SYMPTOMS Dysuria: yes Urinary frequency: yes Urgency: yes Small volume voids: yes Symptom severity: yes Urinary incontinence: no Foul odor: yes Hematuria: no Abdominal pain: yes Back pain: yes Suprapubic pain/pressure: no Flank pain: yes Fever:  no Vomiting: no Relief with cranberry juice: yes Relief with pyridium: no Status: worse Previous urinary tract infection: yes Treatments attempted: cranberry and increasing fluids   Relevant past medical, surgical, family and social history reviewed and updated as indicated. Interim medical history since our last visit reviewed. Allergies and medications reviewed and updated.  Review of Systems  Constitutional:  Negative for fever.  Gastrointestinal:  Positive for abdominal pain. Negative for vomiting.  Genitourinary:  Positive for decreased urine volume, dysuria, flank pain, frequency and urgency. Negative for hematuria.  Musculoskeletal:  Negative for back pain.   Per HPI unless specifically indicated above     Objective:    BP (!) 146/73   Pulse (!) 57   Temp 98.2 F (36.8 C) (Oral)   Wt 187 lb 6.4 oz (85 kg)   SpO2 93%   BMI 32.39 kg/m   Wt Readings from Last 3 Encounters:  10/22/21 187 lb 6.4 oz (85 kg)  10/04/21 186 lb (84.4 kg)  07/20/21 178 lb 6.4 oz (80.9 kg)    Physical Exam Vitals and nursing note reviewed.  Constitutional:      General: She is not in acute distress.    Appearance: Normal appearance. She is normal weight. She is not ill-appearing, toxic-appearing or diaphoretic.  HENT:     Head: Normocephalic.     Right  Ear: External ear normal.     Left Ear: External ear normal.     Nose: Nose normal.     Mouth/Throat:     Mouth: Mucous membranes are moist.     Pharynx: Oropharynx is clear.  Eyes:     General:        Right eye: No discharge.        Left eye: No discharge.     Extraocular Movements: Extraocular movements intact.     Conjunctiva/sclera: Conjunctivae normal.     Pupils: Pupils are equal, round, and reactive to light.  Cardiovascular:     Rate and Rhythm: Normal rate and regular rhythm.     Heart sounds: No murmur heard. Pulmonary:     Effort: Pulmonary effort is normal. No respiratory distress.     Breath sounds: Normal breath sounds. No wheezing or rales.  Abdominal:     General: Abdomen is flat. Bowel sounds are normal. There is no distension.     Palpations: Abdomen is soft.     Tenderness: There is no abdominal tenderness. There is right CVA tenderness and left CVA tenderness. There is no guarding.  Musculoskeletal:     Cervical back: Normal range of motion and neck supple.  Skin:    General: Skin is warm and dry.     Capillary Refill: Capillary refill takes less than 2 seconds.  Neurological:     General: No focal deficit present.     Mental Status: She is alert and oriented to person, place, and time. Mental status is at baseline.  Psychiatric:        Mood and Affect: Mood normal.        Behavior: Behavior normal.        Thought Content: Thought content normal.        Judgment: Judgment normal.    Results for orders placed or performed in visit on 10/04/21  Microscopic Examination  Result Value Ref Range   WBC, UA 0-5 0 - 5 /hpf   RBC 0-2 0 - 2 /hpf   Epithelial Cells (non renal) 0-10 0 - 10 /hpf   Bacteria, UA Many (A) None seen/Few  WET PREP FOR TRICH, YEAST, CLUE   Sterile Swab  Result Value Ref Range   Trichomonas Exam Negative Negative   Yeast Exam Positive (A) Negative   Clue Cell Exam Negative Negative  Urine Culture   Specimen: Urine   UR  Result  Value Ref Range   Urine Culture, Routine Final report (A)    Organism ID, Bacteria Escherichia coli (A)    Antimicrobial Susceptibility Comment   Comp Met (CMET)  Result Value Ref Range   Glucose 284 (H) 70 - 99 mg/dL   BUN 20 8 - 27 mg/dL   Creatinine, Ser 1.00 0.57 - 1.00 mg/dL   eGFR 63 >59 mL/min/1.73   BUN/Creatinine Ratio 20 12 - 28   Sodium 142 134 - 144 mmol/L   Potassium 4.2 3.5 - 5.2 mmol/L   Chloride 107 (H) 96 - 106 mmol/L   CO2 21 20 - 29 mmol/L   Calcium 10.5 (H) 8.7 - 10.3 mg/dL   Total Protein 6.4 6.0 - 8.5 g/dL   Albumin 4.0 3.8 - 4.8 g/dL   Globulin, Total 2.4 1.5 - 4.5 g/dL   Albumin/Globulin Ratio 1.7 1.2 - 2.2   Bilirubin Total <0.2 0.0 - 1.2 mg/dL   Alkaline Phosphatase 96 44 - 121 IU/L   AST 17 0 - 40 IU/L   ALT 12 0 - 32 IU/L  Lipid Profile  Result Value Ref Range   Cholesterol, Total 218 (H) 100 - 199 mg/dL   Triglycerides 366 (H) 0 - 149 mg/dL   HDL 45 >39 mg/dL   VLDL Cholesterol Cal 63 (H) 5 - 40 mg/dL   LDL Chol Calc (NIH) 110 (H) 0 - 99 mg/dL   Chol/HDL Ratio 4.8 (H) 0.0 - 4.4 ratio  HgB A1c  Result Value Ref Range   Hgb A1c MFr Bld 8.8 (H) 4.8 - 5.6 %   Est. average glucose Bld gHb Est-mCnc 206 mg/dL  Vitamin D (25 hydroxy)  Result Value Ref Range   Vit D, 25-Hydroxy 36.9 30.0 - 100.0 ng/mL  Urinalysis, Routine w reflex microscopic  Result Value Ref Range   Specific Gravity, UA 1.020 1.005 - 1.030   pH, UA 6.5 5.0 - 7.5   Color, UA Yellow Yellow   Appearance Ur Clear Clear   Leukocytes,UA Negative Negative   Protein,UA Negative Negative/Trace   Glucose, UA 3+ (A) Negative   Ketones, UA Negative Negative   RBC, UA Negative Negative   Bilirubin, UA Negative Negative   Urobilinogen, Ur 0.2 0.2 - 1.0 mg/dL   Nitrite, UA Positive (A) Negative   Microscopic Examination See below:   Hepatitis C Antibody  Result Value Ref Range   Hep C Virus Ab Non Reactive  Non Reactive  HIV Antibody (routine testing w rflx)  Result Value Ref Range    HIV Screen 4th Generation wRfx Non Reactive Non Reactive      Assessment & Plan:   Problem List Items Addressed This Visit   None Visit Diagnoses     Acute cystitis without hematuria    -  Primary   1+leuks on UA. Complete course of Keflex. Urine sent for culture. Increase fluids. Avoid bladder irritants such as coffee.    Relevant Orders   Urine Culture   Dysuria       Relevant Orders   Urinalysis, Routine w reflex microscopic   WET PREP FOR TRICH, YEAST, CLUE   Yeast infection       Will change from Nystatin cream to Clobetazole cream.    Relevant Medications   cephALEXin (KEFLEX) 500 MG capsule   clotrimazole-betamethasone (LOTRISONE) cream        Follow up plan: No follow-ups on file.

## 2021-10-25 ENCOUNTER — Telehealth: Payer: Self-pay | Admitting: Nurse Practitioner

## 2021-10-25 LAB — URINE CULTURE

## 2021-10-25 MED ORDER — CEFUROXIME AXETIL 500 MG PO TABS: 500.0000 mg | ORAL_TABLET | Freq: Two times a day (BID) | ORAL | 0 refills | Status: AC

## 2021-10-25 NOTE — Telephone Encounter (Signed)
Patient has asked for GI Dr. Ricky Stabs # , she states he will help her with what she needs. Gave [facility] # for patient. Patient states that is what she should have done in the first place.

## 2021-10-25 NOTE — Telephone Encounter (Signed)
Unfortunately, patient does not have a lot of options as far as antibiotics.  If she can fight though the nausea that would be best at this time.

## 2021-10-25 NOTE — Progress Notes (Signed)
Please let patient know that her urine grew a very difficult bacteria to kill.  She has two options.  I know she got a headache with taking doxycycline however this is the only option she has to take orally.  Otherwise, she will need to be seen in the ER for IV antibiotics.  She can take tylenol for the headache that she would get with the doxycyline.

## 2021-10-25 NOTE — Addendum Note (Signed)
Addended by: Jon Billings on: 10/25/2021 01:14 PM   Modules accepted: Orders

## 2021-10-25 NOTE — Telephone Encounter (Signed)
Spoke with patient and notified patient that she would have to reach out to her current eye doctor. Patient states she is currently says she sees [facility] and not sure that they can refer her to have cataract surgery. Would we need to send referral for ophthalmology for surgery. Please advise?

## 2021-10-25 NOTE — Telephone Encounter (Signed)
Copied from [place] [phone]. Topic: General - Other >> Oct 24, 2021  4:31 PM Bayard Beaver wrote: Reason for CRM:pt called in wanting recommendations for surgeon for cataracts on her left eye. Please call back

## 2021-11-01 ENCOUNTER — Ambulatory Visit: Payer: Self-pay

## 2021-11-01 DIAGNOSIS — M47814 Spondylosis without myelopathy or radiculopathy, thoracic region: Secondary | ICD-10-CM | POA: Diagnosis not present

## 2021-11-01 DIAGNOSIS — I10 Essential (primary) hypertension: Secondary | ICD-10-CM | POA: Diagnosis not present

## 2021-11-01 DIAGNOSIS — R079 Chest pain, unspecified: Secondary | ICD-10-CM | POA: Diagnosis not present

## 2021-11-01 DIAGNOSIS — R300 Dysuria: Secondary | ICD-10-CM | POA: Diagnosis not present

## 2021-11-01 DIAGNOSIS — R109 Unspecified abdominal pain: Secondary | ICD-10-CM | POA: Diagnosis not present

## 2021-11-01 DIAGNOSIS — M797 Fibromyalgia: Secondary | ICD-10-CM | POA: Diagnosis not present

## 2021-11-01 DIAGNOSIS — K760 Fatty (change of) liver, not elsewhere classified: Secondary | ICD-10-CM | POA: Diagnosis not present

## 2021-11-01 DIAGNOSIS — R410 Disorientation, unspecified: Secondary | ICD-10-CM | POA: Diagnosis not present

## 2021-11-01 DIAGNOSIS — Z87891 Personal history of nicotine dependence: Secondary | ICD-10-CM | POA: Diagnosis not present

## 2021-11-01 DIAGNOSIS — R1012 Left upper quadrant pain: Secondary | ICD-10-CM | POA: Diagnosis not present

## 2021-11-01 DIAGNOSIS — E119 Type 2 diabetes mellitus without complications: Secondary | ICD-10-CM | POA: Diagnosis not present

## 2021-11-01 DIAGNOSIS — R42 Dizziness and giddiness: Secondary | ICD-10-CM | POA: Diagnosis not present

## 2021-11-01 NOTE — Telephone Encounter (Signed)
Summary: UTI   Pts UTI is worse/ pt has lower back pain,burning and itching / please advise        Chief Complaint: Flank pain Symptoms: itching and burning with each urination Frequency: sx initially started 10/04/21,  Pertinent Negatives: Patient denies fever Disposition: '[]'$ ED /'[]'$ Urgent Care (no appt availability in office) / '[]'$ Appointment(In office/virtual)/ '[]'$  [facility]/ '[]'$ Home Care/ '[]'$ Refused Recommended Disposition /'[]'$ [place] [facility]/ '[]'$  Follow-up with PCP Additional Notes: Pt was initially placed on Keflex made her very nauseated then was put on Cefuroxime on 10/25/21 and called today with flank pain. Called office and spoke to [facility]. Per note dated 10/22/21 : Please let patient know that her urine grew a very difficult bacteria to kill.  She has two options.  I know she got a headache with taking doxycycline however this is the only option she has to take orally.  Otherwise, she will need to be seen in the ER for IV antibiotics.  She can take tylenol for the headache that she would get with the doxycyline.  Reason for Disposition  [1] Taking antibiotic > 24 hours for UTI AND [2] flank or lower back pain worsening  Answer Assessment - Initial Assessment Questions 1. ANTIBIOTIC: "What antibiotic are you taking?" "How many times per day?"     Ceftin 2. DURATION: "When was the antibiotic started?"     10/25/21 3. MAIN SYMPTOM: "What is the main symptom you are concerned about?"     Lower back pain and itching and burning with urination 4. FEVER: "Do you have a fever?" If Yes, ask: "What is it, how was it measured, and when did it start?"     No-  5. OTHER SYMPTOMS: "Do you have any other symptoms?" (e.g., flank pain, vaginal discharge, blood in urine)     Flank pain  Protocols used: Urinary Tract Infection on Antibiotic Follow-up Call - [facility]

## 2021-11-07 DIAGNOSIS — R2689 Other abnormalities of gait and mobility: Secondary | ICD-10-CM | POA: Diagnosis not present

## 2021-11-07 DIAGNOSIS — Z9884 Bariatric surgery status: Secondary | ICD-10-CM | POA: Diagnosis not present

## 2021-11-07 DIAGNOSIS — Z9181 History of falling: Secondary | ICD-10-CM | POA: Diagnosis not present

## 2021-11-07 DIAGNOSIS — Z8673 Personal history of transient ischemic attack (TIA), and cerebral infarction without residual deficits: Secondary | ICD-10-CM | POA: Diagnosis not present

## 2021-11-07 DIAGNOSIS — M79606 Pain in leg, unspecified: Secondary | ICD-10-CM | POA: Diagnosis not present

## 2021-11-07 DIAGNOSIS — E1140 Type 2 diabetes mellitus with diabetic neuropathy, unspecified: Secondary | ICD-10-CM | POA: Diagnosis not present

## 2021-11-07 DIAGNOSIS — Z96651 Presence of right artificial knee joint: Secondary | ICD-10-CM | POA: Diagnosis not present

## 2021-11-07 DIAGNOSIS — G894 Chronic pain syndrome: Secondary | ICD-10-CM | POA: Diagnosis not present

## 2021-11-07 DIAGNOSIS — Z5181 Encounter for therapeutic drug level monitoring: Secondary | ICD-10-CM | POA: Diagnosis not present

## 2021-11-07 DIAGNOSIS — M810 Age-related osteoporosis without current pathological fracture: Secondary | ICD-10-CM | POA: Diagnosis not present

## 2021-11-07 DIAGNOSIS — M797 Fibromyalgia: Secondary | ICD-10-CM | POA: Diagnosis not present

## 2021-11-11 ENCOUNTER — Other Ambulatory Visit: Payer: Self-pay | Admitting: Nurse Practitioner

## 2021-11-13 DIAGNOSIS — Z79891 Long term (current) use of opiate analgesic: Secondary | ICD-10-CM | POA: Diagnosis not present

## 2021-11-13 DIAGNOSIS — M5136 Other intervertebral disc degeneration, lumbar region: Secondary | ICD-10-CM | POA: Diagnosis not present

## 2021-11-13 DIAGNOSIS — M79604 Pain in right leg: Secondary | ICD-10-CM | POA: Diagnosis not present

## 2021-11-13 DIAGNOSIS — M47816 Spondylosis without myelopathy or radiculopathy, lumbar region: Secondary | ICD-10-CM | POA: Diagnosis not present
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
## 2021-11-19 ENCOUNTER — Ambulatory Visit: Payer: Medicare Other | Admitting: Family Medicine

## 2021-11-23 IMAGING — CR DG FEMUR 2+V*R*
4 series · 4 of 4 positions shown · non-contrast
Comparison: None.

CLINICAL DATA: Fall

EXAM:
RIGHT FEMUR 2 VIEWS

[femur ap (1 of 2)]
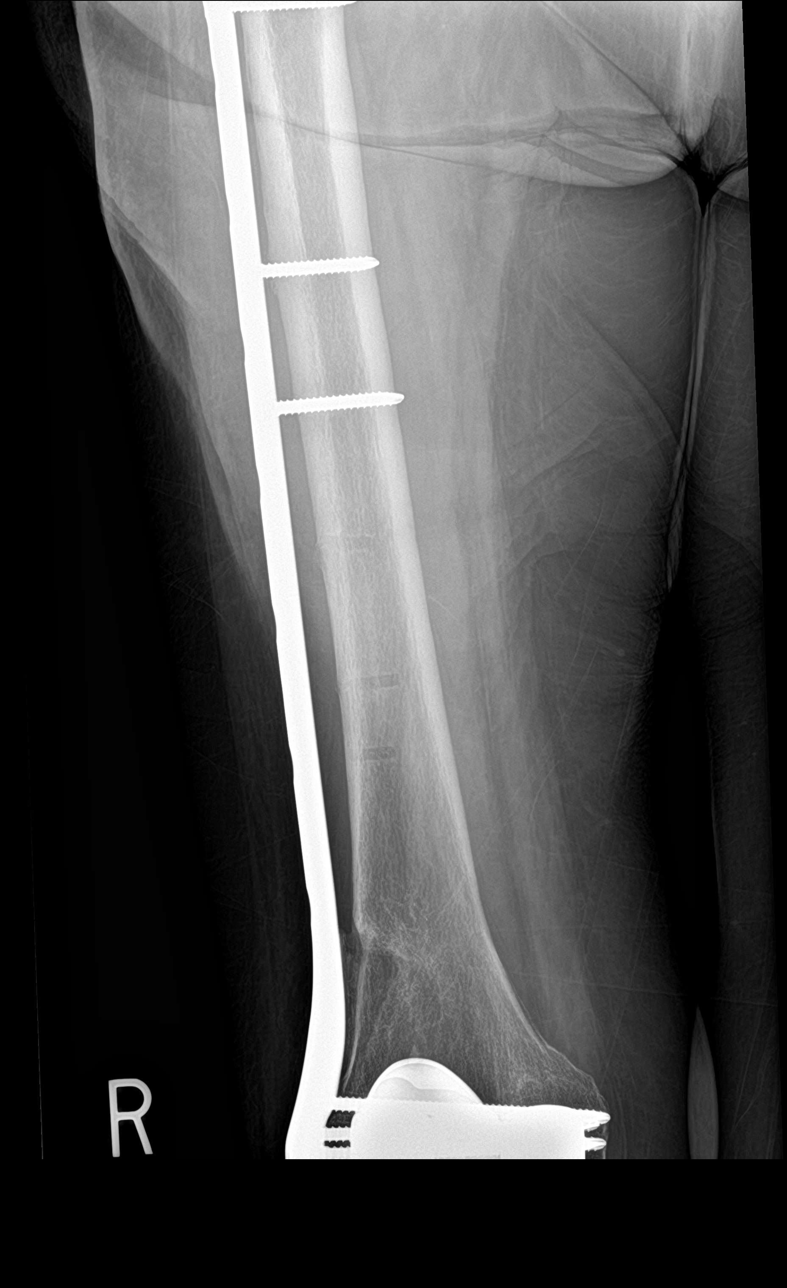

[femur ap (2 of 2)]
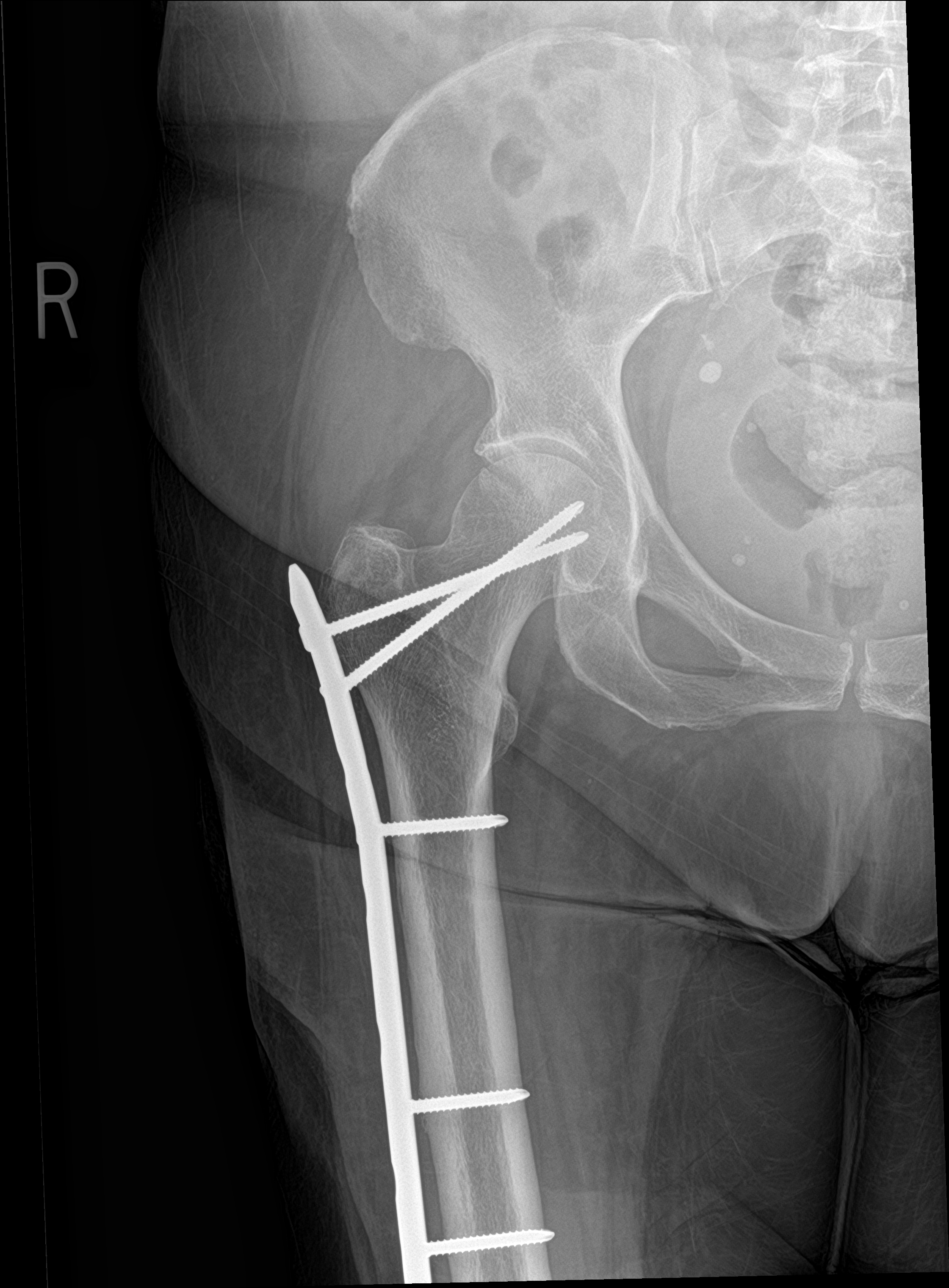

[femur lat (1 of 2)]
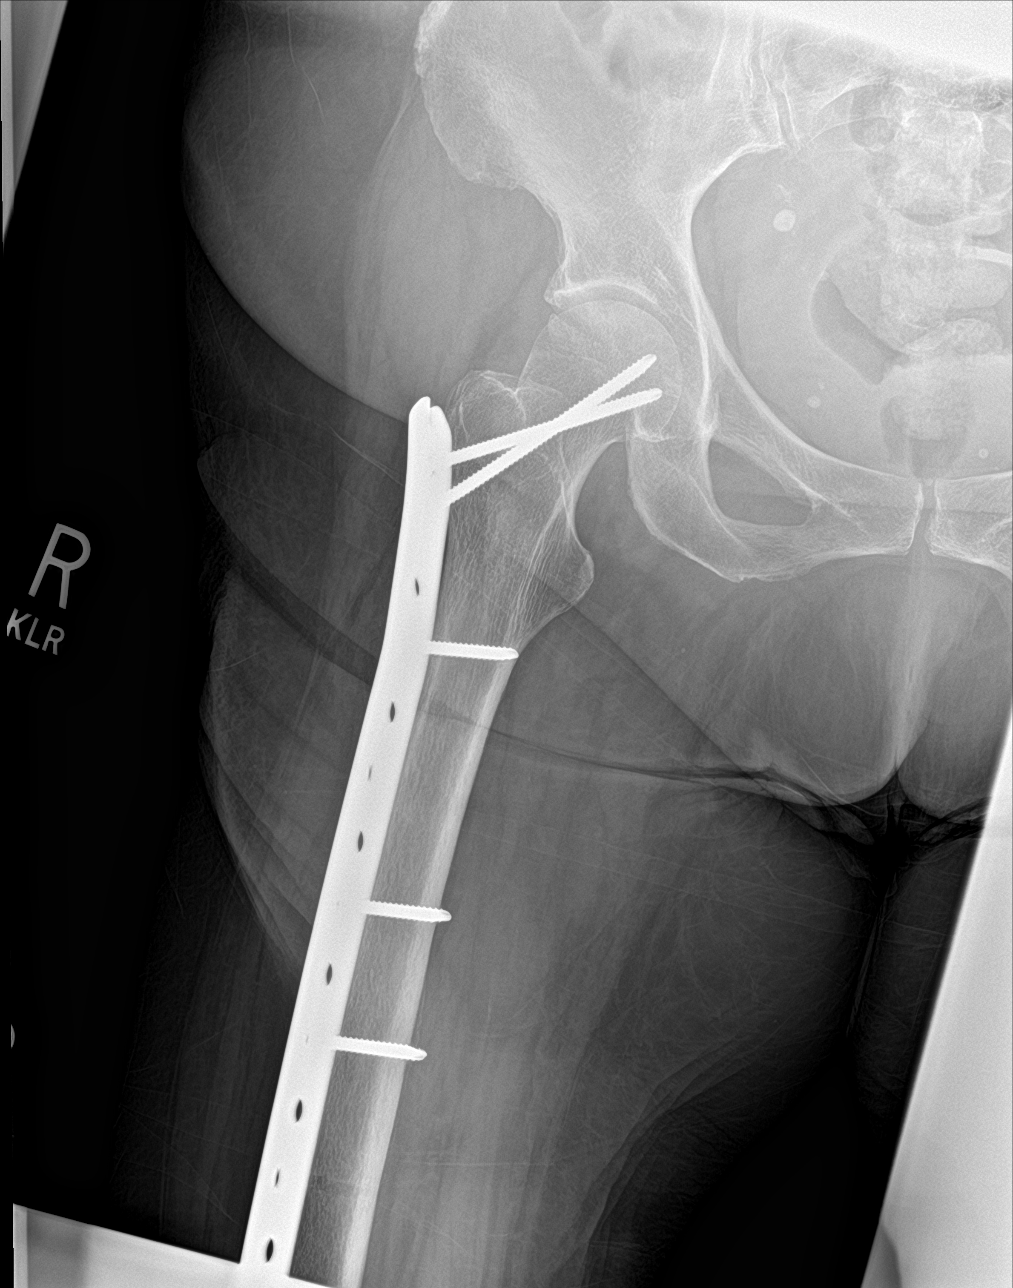

[femur lat (2 of 2)]
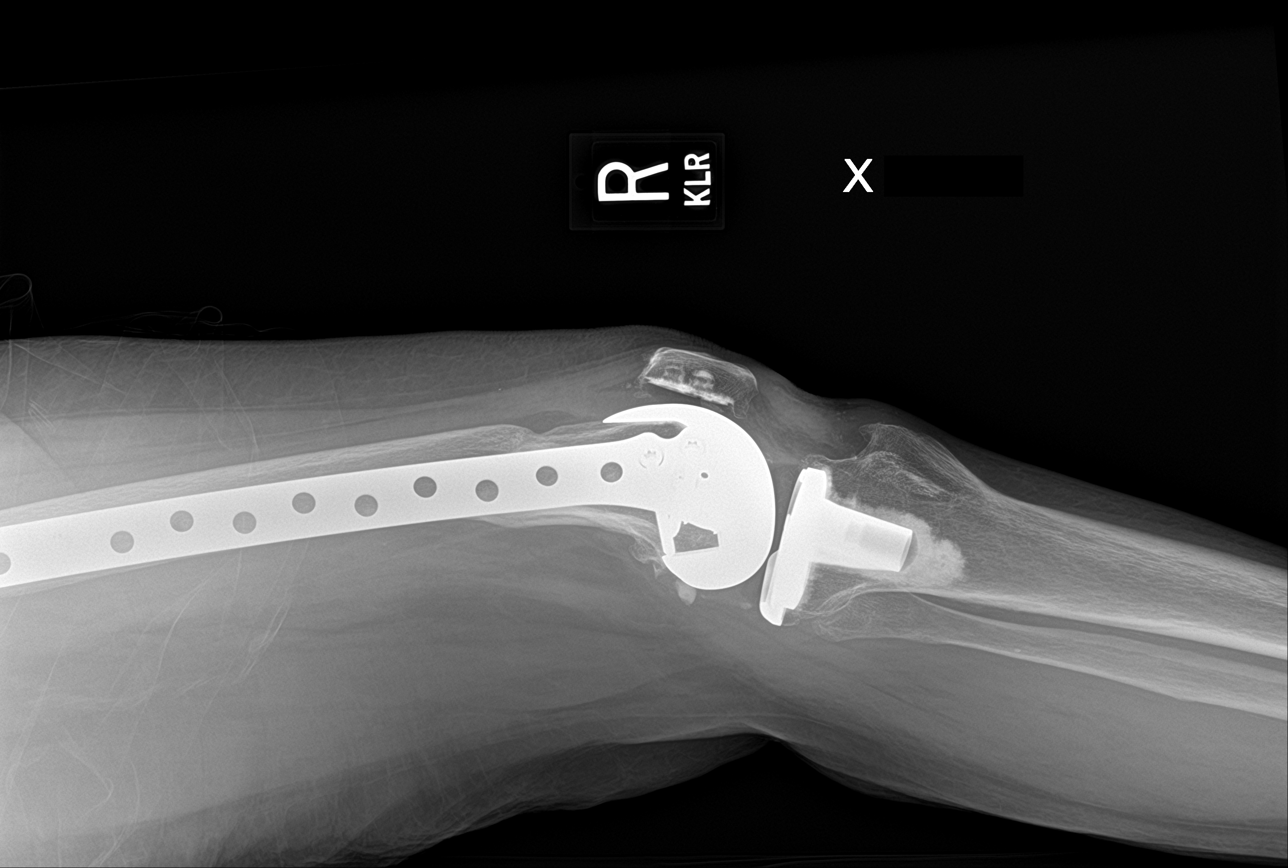

[4 of 4 positions shown; findings below may reference images not displayed]

FINDINGS: There is no evidence of fracture or other focal bone lesions. Soft
tissues are unremarkable. Status post right total knee arthroplasty
and femoral fixation.
IMPRESSION: Negative.

## 2021-11-26 ENCOUNTER — Ambulatory Visit (INDEPENDENT_AMBULATORY_CARE_PROVIDER_SITE_OTHER): Payer: Medicare Other | Admitting: Family Medicine

## 2021-11-26 ENCOUNTER — Encounter: Payer: Self-pay | Admitting: Family Medicine

## 2021-11-26 VITALS — BP 117/70 | HR 61 | Temp 98.5°F | Wt 191.6 lb

## 2021-11-26 DIAGNOSIS — N390 Urinary tract infection, site not specified: Secondary | ICD-10-CM | POA: Diagnosis not present

## 2021-11-26 DIAGNOSIS — Z794 Long term (current) use of insulin: Secondary | ICD-10-CM

## 2021-11-26 DIAGNOSIS — R609 Edema, unspecified: Secondary | ICD-10-CM | POA: Diagnosis not present

## 2021-11-26 DIAGNOSIS — E1149 Type 2 diabetes mellitus with other diabetic neurological complication: Secondary | ICD-10-CM | POA: Diagnosis not present

## 2021-11-26 DIAGNOSIS — R300 Dysuria: Secondary | ICD-10-CM | POA: Diagnosis not present

## 2021-11-26 LAB — URINALYSIS, ROUTINE W REFLEX MICROSCOPIC
Bilirubin, UA: NEGATIVE
Ketones, UA: NEGATIVE
Nitrite, UA: POSITIVE — AB
Protein,UA: NEGATIVE
RBC, UA: NEGATIVE
Specific Gravity, UA: 1.02 (ref 1.005–1.030)
Urobilinogen, Ur: 0.2 mg/dL (ref 0.2–1.0)
pH, UA: 6 (ref 5.0–7.5)

## 2021-11-26 LAB — MICROSCOPIC EXAMINATION

## 2021-11-28 DIAGNOSIS — E119 Type 2 diabetes mellitus without complications: Secondary | ICD-10-CM | POA: Diagnosis not present

## 2021-11-29 ENCOUNTER — Ambulatory Visit: Payer: Medicare Other | Admitting: Family Medicine

## 2021-11-29 ENCOUNTER — Other Ambulatory Visit: Payer: Self-pay | Admitting: Family Medicine

## 2021-11-29 DIAGNOSIS — N390 Urinary tract infection, site not specified: Secondary | ICD-10-CM

## 2021-11-29 LAB — URINE CULTURE

## 2021-11-29 MED ORDER — CEFDINIR 300 MG PO CAPS: 300.0000 mg | ORAL_CAPSULE | Freq: Two times a day (BID) | ORAL | 0 refills | Status: AC

## 2021-12-11 DIAGNOSIS — Z79891 Long term (current) use of opiate analgesic: Secondary | ICD-10-CM | POA: Diagnosis not present

## 2021-12-11 DIAGNOSIS — M5136 Other intervertebral disc degeneration, lumbar region: Secondary | ICD-10-CM | POA: Diagnosis not present

## 2021-12-11 DIAGNOSIS — M25551 Pain in right hip: Secondary | ICD-10-CM | POA: Diagnosis not present

## 2021-12-11 DIAGNOSIS — M47816 Spondylosis without myelopathy or radiculopathy, lumbar region: Secondary | ICD-10-CM | POA: Diagnosis not present

## 2021-12-19 ENCOUNTER — Encounter: Payer: Self-pay | Admitting: Urology

## 2021-12-19 ENCOUNTER — Ambulatory Visit (INDEPENDENT_AMBULATORY_CARE_PROVIDER_SITE_OTHER): Payer: Medicare Other | Admitting: Urology

## 2021-12-19 VITALS — BP 132/81 | HR 70 | Ht 64.0 in | Wt 190.0 lb

## 2021-12-19 DIAGNOSIS — N390 Urinary tract infection, site not specified: Secondary | ICD-10-CM

## 2021-12-19 DIAGNOSIS — R300 Dysuria: Secondary | ICD-10-CM

## 2021-12-19 DIAGNOSIS — N200 Calculus of kidney: Secondary | ICD-10-CM | POA: Diagnosis not present

## 2021-12-19 LAB — URINALYSIS, COMPLETE
Bilirubin, UA: NEGATIVE
Ketones, UA: NEGATIVE
Nitrite, UA: POSITIVE — AB
Protein,UA: NEGATIVE
Specific Gravity, UA: 1.02 (ref 1.005–1.030)
Urobilinogen, Ur: 0.2 mg/dL (ref 0.2–1.0)
pH, UA: 5.5 (ref 5.0–7.5)

## 2021-12-19 LAB — MICROSCOPIC EXAMINATION

## 2021-12-19 MED ORDER — ESTRADIOL 0.1 MG/GM VA CREA: TOPICAL_CREAM | VAGINAL | 12 refills | Status: AC

## 2021-12-19 NOTE — Progress Notes (Signed)
12/19/21 5:56 PM   [name] [birth year] [id number]  Referring provider:  Valerie Roys, DO [place],  [place] [address] Chief Complaint  Patient presents with   Recurrent UTI      HPI: Lisa Matthews is a 66 y.o.female with a personal history of nephrolithiasis, rUTIs, recurrent bacterial cystitis, and atrophic vaginitis who presents today for further evaluation of rUTIs.   She was last seen in clinic in 02/2021. She was noted to have UTI symptoms. She was restarted on topical estrogen cream.  She reports that she believes she use all the cream after a month or 2 and did not continue.  She is not using it currently.  She is not taking any UTI prevention medications.  She reports today that since about February when she had a episode of intercourse, she started having almost persistent recurrent UTIs.  She was treated with multiple rounds of antibiotics complicated by extensive drug allergy list.  Each time she was treated with antibiotic, her symptoms improved but symptoms never completely resolved and quickly returned.  Her symptoms that she associates with infection are vaginal itching, vaginal discharge, burning with sounds like at the urethra but also in the vaginal area, some mild urgency and low back pain.  No fevers or chills.  No gross hematuria.  She was seen on 11/26/2021 by Park Liter, DO. She was noted to have burning with urination and vaginal itching. UA showed trace trace leukocytes, +3 glucose, and nitrite positive. Urine culture grew E.coli she was treated with Omnicef.  She was treated before this visit on 6/01//2023 in the ED for dysuria   UA today shows 11-30 wbcs, nitrite postive and many bacteria.  She does have a known 6 mm nonobstructing lower pole stone which is chronic and asymptomatic.  PMH: Past Medical History:  Diagnosis Date   Allergy    Anxiety    Arthritis    COPD (chronic obstructive pulmonary disease) ([place])    Depression     Diabetes mellitus without complication (HCC)    GERD (gastroesophageal reflux disease)    Heart murmur    Hypertension    Kidney stones    Macular degeneration    Osteoporosis    Sleep apnea    Stroke [facility])     Surgical History: Past Surgical History:  Procedure Laterality Date   CESAREAN SECTION     CHOLECYSTECTOMY     COLONOSCOPY WITH PROPOFOL N/A 05/31/2021   Procedure: COLONOSCOPY WITH PROPOFOL;  Surgeon: Annamaria Helling, DO;  Location: [facility] ENDOSCOPY;  Service: Gastroenterology;  Laterality: N/A;   CYSTOSCOPY/URETEROSCOPY/HOLMIUM LASER/STENT PLACEMENT     ESOPHAGOGASTRODUODENOSCOPY (EGD) WITH PROPOFOL N/A 05/31/2021   Procedure: ESOPHAGOGASTRODUODENOSCOPY (EGD) WITH PROPOFOL;  Surgeon: Annamaria Helling, DO;  Location: [facility] ENDOSCOPY;  Service: Gastroenterology;  Laterality: N/A;  DM   FRACTURE SURGERY     JOINT REPLACEMENT Right    knee   TONSILLECTOMY     TUBAL LIGATION      Home Medications:  Allergies as of 12/19/2021       Reactions   Sitagliptin Anaphylaxis   Atorvastatin Other (See Comments), Rash   Pain Pain Pain Pain   Bupropion Other (See Comments)   "Make me delirious" "Make me delirious" "Make me delirious" "Make me delirious"   Nitrofurantoin Macrocrystal Rash   Nsaids Hives, Rash   Other Hives   Pravastatin Other (See Comments)   Ciprofloxacin Other (See Comments)   Patient does not know Patient does not know  Doxycycline    headache   Antihistamines, Loratadine-type Rash   Baclofen Rash   Cephalexin Rash   Exenatide Rash   Ezetimibe Rash   Fluconazole Rash   Hydromorphone Rash   Morphine Rash   Oxybutynin Nausea And Vomiting   Penicillins Rash   Prednisone Rash   Simvastatin Rash   Sulfa Antibiotics Rash   Trazodone Rash   Wound Dressing Adhesive Rash        Medication List        Accurate as of December 19, 2021  5:56 PM. If you have any questions, ask your nurse or doctor.          STOP taking these  medications    acetaminophen 650 MG CR tablet Commonly known as: TYLENOL Stopped by: Hollice Espy, MD   eszopiclone 1 MG Tabs tablet Commonly known as: LUNESTA Stopped by: Hollice Espy, MD   fenofibrate 145 MG tablet Commonly known as: Tricor Stopped by: Hollice Espy, MD   promethazine 12.5 MG tablet Commonly known as: PHENERGAN Stopped by: Hollice Espy, MD       TAKE these medications    Accu-Chek Guide Me w/Device Kit CHECK BLOOD SUGAR 4 TIMES DAILY   Accu-Chek Guide test strip Generic drug: glucose blood USE TO CHECK BLOOD SUGAR 4 TIMES DAILY   amLODipine 2.5 MG tablet Commonly known as: NORVASC Take 1 tablet (2.5 mg total) by mouth daily.   atenolol 50 MG tablet Commonly known as: TENORMIN Take 1 tablet (50 mg total) by mouth daily.   Biotin 10000 MCG Tabs Take 1 tablet by mouth daily.   Calcium Carbonate-Vitamin D 600-400 MG-UNIT tablet Take 1 tablet by mouth daily.   cefdinir 300 MG capsule Commonly known as: OMNICEF Take 1 capsule (300 mg total) by mouth 2 (two) times daily.   Cholecalciferol 25 MCG (1000 UT) tablet Take 1,000 Units by mouth daily.   clotrimazole-betamethasone cream Commonly known as: LOTRISONE APPLY  CREAM TOPICALLY TO AFFECTED AREA ONCE DAILY   cyanocobalamin 1000 MCG tablet Take 1,000 mcg by mouth daily.   dapagliflozin propanediol 10 MG Tabs tablet Commonly known as: Farxiga Take 1 tablet (10 mg total) by mouth daily.   estradiol 0.1 MG/GM vaginal cream Commonly known as: ESTRACE Estrogen Cream Instruction Discard applicator Apply pea sized amount to tip of finger to urethra before bed. Wash hands well after application. Use Monday, Wednesday and Friday Started by: Hollice Espy, MD   gabapentin 300 MG capsule Commonly known as: NEURONTIN Take 1 capsule (300 mg total) by mouth 4 (four) times daily.   Lancets Misc   Levemir FlexPen 100 UNIT/ML FlexPen Generic drug: insulin detemir Inject 10 Units into the  skin daily.   linaclotide 145 MCG Caps capsule Commonly known as: Linzess Take 1 capsule (145 mcg total) by mouth daily before breakfast.   losartan 25 MG tablet Commonly known as: COZAAR Take 1 tablet (25 mg total) by mouth daily.   metFORMIN 500 MG tablet Commonly known as: GLUCOPHAGE Take 1 tablet (500 mg total) by mouth in the morning, at noon, and at bedtime.   mirtazapine 30 MG tablet Commonly known as: REMERON Take 1 tablet (30 mg total) by mouth at bedtime.   omeprazole 40 MG capsule Commonly known as: PRILOSEC Take 1 capsule (40 mg total) by mouth daily.   oxyCODONE 5 MG immediate release tablet Commonly known as: Oxy IR/ROXICODONE Take 5 mg by mouth 3 (three) times daily as needed.   Pen Needles 3/16" 31G X 5  MM Misc 1 application by Does not apply route daily.   Teriparatide (Recombinant) 620 MCG/2.48ML Sopn Inject into the skin at bedtime.   venlafaxine XR 150 MG 24 hr capsule Commonly known as: EFFEXOR-XR TAKE 1 CAPSULE BY MOUTH IN THE MORNING AND AT BEDTIME Strength: 150 mg   vitamin C 500 MG tablet Commonly known as: ASCORBIC ACID Take 500 mg by mouth daily.   zinc gluconate 50 MG tablet Take 50 mg by mouth daily.        Allergies:  Allergies  Allergen Reactions   Sitagliptin Anaphylaxis   Atorvastatin Other (See Comments) and Rash    Pain Pain Pain Pain    Bupropion Other (See Comments)    "Make me delirious" "Make me delirious" "Make me delirious" "Make me delirious"    Nitrofurantoin Macrocrystal Rash   Nsaids Hives and Rash   Other Hives   Pravastatin Other (See Comments)   Ciprofloxacin Other (See Comments)    Patient does not know Patient does not know    Doxycycline     headache   Antihistamines, Loratadine-Type Rash   Baclofen Rash   Cephalexin Rash   Exenatide Rash   Ezetimibe Rash   Fluconazole Rash   Hydromorphone Rash   Morphine Rash   Oxybutynin Nausea And Vomiting   Penicillins Rash   Prednisone Rash    Simvastatin Rash   Sulfa Antibiotics Rash   Trazodone Rash   Wound Dressing Adhesive Rash    Family History: Family History  Problem Relation Age of Onset   Heart attack Mother 62   Emphysema Father    Heart attack Father 46   Heart disease Father    Cancer Sister    Bipolar disorder Brother    Drug abuse Daughter    Drug abuse Son     Social History:  reports that she quit smoking about 15 years ago. Her smoking use included cigarettes. She has a 15.00 pack-year smoking history. She has never used smokeless tobacco. She reports that she does not drink alcohol and does not use drugs.   Physical Exam: BP 132/81   Pulse 70   Ht _0  (1.626 m)   Wt 190 lb (86.2 kg)   BMI 32.61 kg/m   Constitutional:  Alert and oriented, No acute distress. HEENT:  AT, moist mucus membranes.  Trachea midline, no masses. Cardiovascular: No clubbing, cyanosis, or edema. Respiratory: Normal respiratory effort, no increased work of breathing. Skin: No rashes, bruises or suspicious lesions. Neurologic: Grossly intact, no focal deficits, moving all 4 extremities. Psychiatric: Normal mood and affect.  Laboratory Data:  Lab Results  Component Value Date   CREATININE 1.00 10/04/2021   Lab Results  Component Value Date   HGBA1C 8.8 (H) 10/04/2021    Urinalysis UA today shows 11-30 wbcs, nitrite postive and many bacteria  Pertinent imaging:   Results for orders placed or performed in visit on 12/19/21  Microscopic Examination   Urine  Result Value Ref Range   WBC, UA 11-30 (A) 0 - 5 /hpf   RBC, Urine 0-2 0 - 2 /hpf   Epithelial Cells (non renal) 0-10 0 - 10 /hpf   Bacteria, UA Many (A) None seen/Few  Urinalysis, Complete  Result Value Ref Range   Specific Gravity, UA 1.020 1.005 - 1.030   pH, UA 5.5 5.0 - 7.5   Color, UA Yellow Yellow   Appearance Ur Clear Clear   Leukocytes,UA Trace (A) Negative   Protein,UA Negative Negative/Trace   Glucose,  UA 3+ (A) Negative   Ketones, UA  Negative Negative   RBC, UA 1+ (A) Negative   Bilirubin, UA Negative Negative   Urobilinogen, Ur 0.2 0.2 - 1.0 mg/dL   Nitrite, UA Positive (A) Negative   Microscopic Examination See below:     Assessment & Plan:    Recurrent urinary tract infection versus chronic bacterial colonization - UA grossly positive today, has persistently appeared this way on numerous occasions growing now multidrug-resistant E. coli - Will send urine for culture and send for fosfomycin sensitivities as well given her extensive antibiotic intolerance -Her symptoms may or may not be true [place] related to urinary tract, may be irritative from vaginitis versus atrophic vaginitis given the absence of overt dysuria and only minimal irritative urinary symptoms - Recommend she start vaginal estrogen cream x3 weekly; recommend starting daily for 2 weeks and then transition to 3 times per week primarily with pea-sized amount per urethral meatus - Estradiol; prescribed  -In addition to the above, we discussed supplementation with daily cranberry tablets, probiotic, and d-mannose -If her symptoms fail to improve after treatment or recur, would recommend catheterized urine specimen to rule out contamination as well as a vaginal exam to assess for any underlying vaginal conditions, she is agreeable this plan and will call if her symptoms fail to resolve completely for reassessment  2.  Kidney stone -Most recent imaging shows stable nonobstructing stone with minimal growth, do not think that this is a contributing factor but may consider treatment of this down the road if she continues to have issues    Return if symptoms worsen or fail to improve.  Conley Rolls as a Education administrator for Hollice Espy, MD.,have documented all relevant documentation on the behalf of Hollice Espy, MD,as directed by  Hollice Espy, MD while in the presence of Hollice Espy, MD.  I have reviewed the above documentation for accuracy and  completeness, and I agree with the above.   Hollice Espy, MD   [facility] [address] [place], [place] [address] [phone]

## 2021-12-19 NOTE — Patient Instructions (Addendum)
Start OTC D-Mannose and Cranberry Tablets.   Estrogen Cream Instruction  Discard applicator  Apply pea sized amount to tip of finger to urethra before bed. Wash hands well after application. Use every day for 2 weeks and then use every Monday, Wednesday and Friday

## 2021-12-24 ENCOUNTER — Telehealth: Payer: Self-pay

## 2021-12-24 DIAGNOSIS — M7061 Trochanteric bursitis, right hip: Secondary | ICD-10-CM | POA: Diagnosis not present

## 2021-12-24 DIAGNOSIS — M1712 Unilateral primary osteoarthritis, left knee: Secondary | ICD-10-CM | POA: Diagnosis not present

## 2021-12-24 LAB — CULTURE, URINE COMPREHENSIVE

## 2021-12-24 NOTE — Telephone Encounter (Signed)
-----   Message from Hollice Espy, MD sent at 12/24/2021  7:49 AM EDT ----- Can you guys try to add on fosfomycin sensitivities for this urine culture?  You will need to call [facility].  Additionally, if she is able to take Augmentin, we could try treating her with that.  She has a allergies but I believe that she has tolerated several similar medications.  If so, Augmentin twice daily for 7 days.  I still think having fosfomycin sensitivities would be helpful for the future regardless.  Hollice Espy, MD

## 2021-12-24 NOTE — Telephone Encounter (Signed)
Spoke with [facility], they will send a fax confirming fosfomycin sensitivity.   Left message for pt to return my call

## 2021-12-29 DIAGNOSIS — E119 Type 2 diabetes mellitus without complications: Secondary | ICD-10-CM | POA: Diagnosis not present

## 2022-01-01 ENCOUNTER — Telehealth: Payer: Self-pay | Admitting: *Deleted

## 2022-01-01 DIAGNOSIS — N390 Urinary tract infection, site not specified: Secondary | ICD-10-CM

## 2022-01-01 MED ORDER — FOSFOMYCIN TROMETHAMINE 3 G PO PACK: 3.0000 g | PACK | Freq: Once | ORAL | Status: DC

## 2022-01-01 MED ORDER — FOSFOMYCIN TROMETHAMINE 3 G PO PACK: 3.0000 g | PACK | Freq: Once | ORAL | 0 refills | Status: AC

## 2022-01-01 NOTE — Telephone Encounter (Addendum)
Patient informed, sent in RX. Voiced understanding.    ----- Message from Hollice Espy, MD sent at 12/31/2021  2:28 PM EDT ----- Good news, this particular strain of E. coli that she has been growing is in fact susceptible to fosfomycin.  We could try treating her with that if she symptomatic.  Please prescribe 3 g x 1.  Hollice Espy, MD

## 2022-01-15 ENCOUNTER — Other Ambulatory Visit: Payer: Self-pay | Admitting: Nurse Practitioner
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
## 2022-01-16 ENCOUNTER — Other Ambulatory Visit: Payer: Self-pay | Admitting: Nurse Practitioner

## 2022-01-16 LAB — SPECIMEN STATUS REPORT

## 2022-01-16 LAB — MIN INHIBITORY CONC (1 DRUG)

## 2022-01-16 MED ORDER — VENLAFAXINE HCL ER 150 MG PO CP24: ORAL_CAPSULE | ORAL | 0 refills | Status: AC
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
## 2022-01-16 NOTE — Telephone Encounter (Signed)
Medication Refill - Medication: venlafaxine XR (EFFEXOR-XR) 150 MG 24 hr capsule  Has the patient contacted their pharmacy? Yes.   Pt told to contact provider  Preferred Pharmacy (with phone number or street name):  [facility] [place] Phone:  [phone]  Fax:  [phone]     Has the patient been seen for an appointment in the last year OR does the patient have an upcoming appointment? Yes.    Agent: Please be advised that RX refills may take up to 3 business days. We ask that you follow-up with your pharmacy.

## 2022-01-17 ENCOUNTER — Telehealth: Payer: Self-pay

## 2022-01-17 NOTE — Telephone Encounter (Signed)
Lisa Matthews, [facility] from [place] calling d/t didn't receive gabapentin from 01/15/22. Advised it was selected as print. Gave verbal order for medication so they can start getting medication ready for pu.

## 2022-01-22 ENCOUNTER — Encounter: Payer: Medicare Other | Admitting: Nurse Practitioner

## 2022-02-07 ENCOUNTER — Telehealth: Payer: Self-pay

## 2022-02-07 NOTE — Telephone Encounter (Signed)
Hello Ms. Lisa Matthews, I am going through our reports for the office and see that you are overdue for your mammogram. I would be glad to order and or schedule the mammogram for you if you would like. We typically use [facility] for this. Would you be ok with this? If so please return our call with what days and times of the day that works best for you. Please let me know either way which you would like to do. I hope you have a great day?

## 2022-02-08 ENCOUNTER — Encounter: Payer: Self-pay | Admitting: Nurse Practitioner

## 2022-02-08 ENCOUNTER — Ambulatory Visit (INDEPENDENT_AMBULATORY_CARE_PROVIDER_SITE_OTHER): Payer: Medicare HMO | Admitting: Nurse Practitioner

## 2022-02-08 VITALS — BP 109/70 | HR 58 | Temp 98.2°F | Ht 64.0 in | Wt 192.7 lb

## 2022-02-08 DIAGNOSIS — M79671 Pain in right foot: Secondary | ICD-10-CM | POA: Diagnosis not present

## 2022-02-08 DIAGNOSIS — M79672 Pain in left foot: Secondary | ICD-10-CM | POA: Diagnosis not present

## 2022-02-08 DIAGNOSIS — N390 Urinary tract infection, site not specified: Secondary | ICD-10-CM | POA: Diagnosis not present

## 2022-02-08 LAB — URINALYSIS, ROUTINE W REFLEX MICROSCOPIC
Bilirubin, UA: NEGATIVE
Ketones, UA: NEGATIVE
Leukocytes,UA: NEGATIVE
Nitrite, UA: NEGATIVE
Protein,UA: NEGATIVE
RBC, UA: NEGATIVE
Specific Gravity, UA: 1.015 (ref 1.005–1.030)
Urobilinogen, Ur: 0.2 mg/dL (ref 0.2–1.0)
pH, UA: 6 (ref 5.0–7.5)

## 2022-02-08 MED ORDER — COLCHICINE 0.6 MG PO TABS: ORAL_TABLET | ORAL | 0 refills | Status: AC

## 2022-02-08 NOTE — Assessment & Plan Note (Signed)
Acute, ?gout vs neuropathy with poorly controlled diabetes, although she does have some gout symptoms.  At this time obtain labs CBC, CMP, uric acid.  Would avoid Prednisone and Indocin.  Start Colchicine and instructed on how to take + how medications works.  Return to office in 3 weeks.

## 2022-02-08 NOTE — Progress Notes (Signed)
BP 109/70   Pulse (!) 58   Temp 98.2 F (36.8 C) (Oral)   Ht '5\' 4"'  (1.626 m)   Wt 192 lb 11.2 oz (87.4 kg)   SpO2 96%   BMI 33.08 kg/m    Subjective:    Patient ID: Lisa Matthews, female    DOB: 1956-01-22, 66 y.o.   MRN: [id number]  HPI: Lisa Matthews is a 66 y.o. female  Chief Complaint  Patient presents with   Gout    Patient says she is having what she thinks is a Gout flare up as she has been having symptoms for the past two weeks. Patient says she is having burning in her toes and a lot of pain. Patient says she has taking Arthritis pain medication and Oxycodone. Patient says she did not have any relief with either medication.    Mammogram   GOUT Started with symptoms two weeks ago.  Started with her right 5th, became swollen and red -- then traveled to other toes and now into left foot.  Last A1c 8.8% in May, she is T2DM on insulin -- kidney function stable 11/01/21. Duration:weeks Right 1st metatarsophalangeal pain: no Left 1st metatarsophalangeal pain: no Right knee pain: no Left knee pain: no Severity: 7/10  Quality: sharp, burning, and throbbing Swelling: yes Redness: yes Trauma: no Recent dietary change or indiscretion: yes Fevers: no Nausea/vomiting: no Aggravating factors: putting sheet over feet Alleviating factors: nothing -- does take Oxycodone but this is not touching it Status:  fluctuating Treatments attempted: Oxycodone, Tylenol  URINARY SYMPTOMS Recurrent UTI's does follow with urology, had last treatment 1 1/2 months ago.  Current symptoms started on week ago. Dysuria: burning Urinary frequency: none Urgency: yes Small volume voids: no Symptom severity: yes Urinary incontinence: no Foul odor: yes Hematuria: no Abdominal pain: no Back pain: no Suprapubic pain/pressure: no Flank pain: no Fever:  no Vomiting: no Status: fluctuate Previous urinary tract infection: yes Recurrent urinary tract infection: yes Sexual activity: No sexually  active History of sexually transmitted disease: no Treatments attempted: cranberry and increasing fluids    Relevant past medical, surgical, family and social history reviewed and updated as indicated. Interim medical history since our last visit reviewed. Allergies and medications reviewed and updated.  Review of Systems  Constitutional:  Negative for activity change, appetite change, diaphoresis, fatigue and fever.  Respiratory:  Negative for cough, chest tightness and shortness of breath.   Cardiovascular:  Negative for chest pain, palpitations and leg swelling.  Gastrointestinal: Negative.   Genitourinary:  Positive for dysuria and urgency. Negative for decreased urine volume, frequency and vaginal discharge.  Musculoskeletal:  Positive for arthralgias.  Neurological: Negative.   Psychiatric/Behavioral: Negative.      Per HPI unless specifically indicated above     Objective:    BP 109/70   Pulse (!) 58   Temp 98.2 F (36.8 C) (Oral)   Ht '5\' 4"'  (1.626 m)   Wt 192 lb 11.2 oz (87.4 kg)   SpO2 96%   BMI 33.08 kg/m   Wt Readings from Last 3 Encounters:  02/08/22 192 lb 11.2 oz (87.4 kg)  12/19/21 190 lb (86.2 kg)  11/26/21 191 lb 9.6 oz (86.9 kg)    Physical Exam Vitals and nursing note reviewed.  Constitutional:      General: She is awake. She is not in acute distress.    Appearance: She is well-developed and well-groomed. She is obese. She is not ill-appearing or toxic-appearing.  HENT:  Head: Normocephalic.     Right Ear: Hearing normal.     Left Ear: Hearing normal.  Eyes:     General: Lids are normal.        Right eye: No discharge.        Left eye: No discharge.     Conjunctiva/sclera: Conjunctivae normal.     Pupils: Pupils are equal, round, and reactive to light.  Neck:     Thyroid: No thyromegaly.     Vascular: No carotid bruit.  Cardiovascular:     Rate and Rhythm: Normal rate and regular rhythm.     Pulses:          Dorsalis pedis pulses are 2+  on the right side and 2+ on the left side.       Posterior tibial pulses are 2+ on the right side and 2+ on the left side.     Heart sounds: Normal heart sounds. No murmur heard.    No gallop.  Pulmonary:     Effort: Pulmonary effort is normal. No accessory muscle usage or respiratory distress.     Breath sounds: Normal breath sounds.  Abdominal:     General: Bowel sounds are normal.     Palpations: Abdomen is soft. There is no hepatomegaly or splenomegaly.  Musculoskeletal:     Cervical back: Normal range of motion and neck supple.     Right lower leg: No edema.     Left lower leg: No edema.     Right foot: Normal range of motion.     Left foot: Normal range of motion.  Feet:     Right foot:     Protective Sensation: 10 sites tested.  10 sites sensed.     Skin integrity: Erythema (to fifth toe) and warmth present.     Toenail Condition: Right toenails are normal.     Left foot:     Protective Sensation: 10 sites tested.  10 sites sensed.     Skin integrity: Skin integrity normal.     Toenail Condition: Left toenails are normal.  Skin:    General: Skin is warm and dry.  Neurological:     Mental Status: She is alert and oriented to person, place, and time.  Psychiatric:        Attention and Perception: Attention normal.        Mood and Affect: Mood normal.        Speech: Speech normal.        Behavior: Behavior normal. Behavior is cooperative.        Thought Content: Thought content normal.    Results for orders placed or performed in visit on 12/19/21  CULTURE, URINE COMPREHENSIVE   Specimen: Urine   UR  Result Value Ref Range   Urine Culture, Comprehensive Final report (A)    Organism ID, Bacteria Comment (A)    Organism ID, Bacteria Klebsiella pneumoniae (A)    ANTIMICROBIAL SUSCEPTIBILITY Comment   Microscopic Examination   Urine  Result Value Ref Range   WBC, UA 11-30 (A) 0 - 5 /hpf   RBC, Urine 0-2 0 - 2 /hpf   Epithelial Cells (non renal) 0-10 0 - 10 /hpf    Bacteria, UA Many (A) None seen/Few  Min Inhibitory Conc (1 Drug)   UR  Result Value Ref Range   Min Inhibitory Conc (1 Drug) Final report    Result 1 (MIC) Escherichia coli   Urinalysis, Complete  Result Value Ref Range   Specific  Gravity, UA 1.020 1.005 - 1.030   pH, UA 5.5 5.0 - 7.5   Color, UA Yellow Yellow   Appearance Ur Clear Clear   Leukocytes,UA Trace (A) Negative   Protein,UA Negative Negative/Trace   Glucose, UA 3+ (A) Negative   Ketones, UA Negative Negative   RBC, UA 1+ (A) Negative   Bilirubin, UA Negative Negative   Urobilinogen, Ur 0.2 0.2 - 1.0 mg/dL   Nitrite, UA Positive (A) Negative   Microscopic Examination See below:   Specimen status report  Result Value Ref Range   specimen status report Comment       Assessment & Plan:   Problem List Items Addressed This Visit       Genitourinary   Recurrent UTI    Chronic, ongoing -- UA today overall reassuring with 3+ glucose only.  ?related to [place] use -- chronic urethritis (burning).  She will discuss with her PCP at next visit for her diabetes check, whether to stay on this as her recent A1c was about goal.  Recommend she focus on increased water intake.       Relevant Orders   Urinalysis, Routine w reflex microscopic   Urine Culture     Other   Pain in both feet - Primary    Acute, ?gout vs neuropathy with poorly controlled diabetes, although she does have some gout symptoms.  At this time obtain labs CBC, CMP, uric acid.  Would avoid Prednisone and Indocin.  Start Colchicine and instructed on how to take + how medications works.  Return to office in 3 weeks.      Relevant Orders   Uric acid   Comp Met (CMET)   CBC with Differential/Platelet     Follow up plan: Return in about 18 days (around 02/26/2022) for T2DM, GOUT, HTN/HLD -- with Santiago Glad + AWV if interested.

## 2022-02-08 NOTE — Assessment & Plan Note (Signed)
Chronic, ongoing -- UA today overall reassuring with 3+ glucose only.  ?related to [place] use -- chronic urethritis (burning).  She will discuss with her PCP at next visit for her diabetes check, whether to stay on this as her recent A1c was about goal.  Recommend she focus on increased water intake.

## 2022-02-08 NOTE — Patient Instructions (Signed)
Gout  Gout is painful swelling of your joints. Gout is a type of arthritis. It is caused by having too much uric acid in your body. Uric acid is a chemical that is made when your body breaks down substances called purines. If your body has too much uric acid, sharp crystals can form and build up in your joints. This causes pain and swelling. Gout attacks can happen quickly and be very painful (acute gout). Over time, the attacks can affect more joints and happen more often (chronic gout). What are the causes? Gout is caused by too much uric acid in your blood. This can happen because: Your kidneys do not remove enough uric acid from your blood. Your body makes too much uric acid. You eat too many foods that are high in purines. These foods include organ meats, some seafood, and beer. Trauma or stress can bring on an attack. What increases the risk? Having a family history of gout. Being female and middle-aged. Being female and having gone through menopause. Having an organ transplant. Taking certain medicines. Having certain conditions, such as: Being very overweight (obese). Lead poisoning. Kidney disease. A skin condition called psoriasis. Other risks include: Losing weight too quickly. Not having enough water in the body (being dehydrated). Drinking alcohol, especially beer. Drinking beverages that are sweetened with a type of sugar called fructose. What are the signs or symptoms? An attack of acute gout often starts at night and usually happens in just one joint. The most common place is the big toe. Other joints that may be affected include joints of the feet, ankle, knee, fingers, wrist, or elbow. Symptoms may include: Very bad pain. Warmth. Swelling. Stiffness. Tenderness. The affected joint may be very painful to touch. Shiny, red, or purple skin. Chills and fever. Chronic gout may cause symptoms more often. More joints may be involved. You may also have white or yellow lumps  (tophi) on your hands or feet or in other areas near your joints. How is this treated? Treatment for an acute attack may include medicines for pain and swelling, such as: NSAIDs, such as ibuprofen. Steroids taken by mouth or injected into a joint. Colchicine. This can be given by mouth or through an IV tube. Treatment to prevent future attacks may include: Taking small doses of NSAIDs or colchicine daily. Using a medicine that reduces uric acid levels in your blood, such as allopurinol. Making changes to your diet. You may need to see a food expert (dietitian) about what to eat and drink to prevent gout. Follow these instructions at home: During a gout attack  If told, put ice on the painful area. To do this: Put ice in a plastic bag. Place a towel between your skin and the bag. Leave the ice on for 20 minutes, 2-3 times a day. Take off the ice if your skin turns bright red. This is very important. If you cannot feel pain, heat, or cold, you have a greater risk of damage to the area. Raise the painful joint above the level of your heart as often as you can. Rest the joint as much as possible. If the joint is in your leg, you may be given crutches. Follow instructions from your doctor about what you cannot eat or drink. Avoiding future gout attacks Eat a low-purine diet. Avoid foods and drinks such as: Liver. Kidney. Anchovies. Asparagus. Herring. Mushrooms. Mussels. Beer. Stay at a healthy weight. If you want to lose weight, talk with your doctor. Do not   lose weight too fast. Start or continue an exercise plan as told by your doctor. Eating and drinking Avoid drinks sweetened by fructose. Drink enough fluids to keep your pee (urine) pale yellow. If you drink alcohol: Limit how much you have to: 0-1 drink a day for women who are not pregnant. 0-2 drinks a day for men. Know how much alcohol is in a drink. In the [place] one drink equals one 12 oz bottle of beer (355 mL), one 5 oz  glass of wine (148 mL), or one 1 oz glass of hard liquor (44 mL). General instructions Take over-the-counter and prescription medicines only as told by your doctor. Ask your doctor if you should avoid driving or using machines while you are taking your medicine. Return to your normal activities when your doctor says that it is safe. Keep all follow-up visits. Where to find more information National Institutes of Health: www.niams.nih.gov Contact a doctor if: You have another gout attack. You still have symptoms of a gout attack after 10 days of treatment. You have problems (side effects) because of your medicines. You have chills or a fever. You have burning pain when you pee (urinate). You have pain in your lower back or belly. Get help right away if: You have very bad pain. Your pain cannot be controlled. You cannot pee. Summary Gout is painful swelling of the joints. The most common site of pain is the big toe, but it can affect other joints. Medicines and avoiding some foods can help to prevent and treat gout attacks. This information is not intended to replace advice given to you by your health care provider. Make sure you discuss any questions you have with your health care provider. Document Revised: 02/21/2021 Document Reviewed: 02/21/2021 Elsevier Patient Education  2023 Elsevier Inc.  

## 2022-02-09 LAB — CBC WITH DIFFERENTIAL/PLATELET
Basophils Absolute: 0 10*3/uL (ref 0.0–0.2)
Basos: 0 %
EOS (ABSOLUTE): 0.1 10*3/uL (ref 0.0–0.4)
Eos: 1 %
Hematocrit: 41.3 % (ref 34.0–46.6)
Hemoglobin: 12.8 g/dL (ref 11.1–15.9)
Immature Grans (Abs): 0 10*3/uL (ref 0.0–0.1)
Immature Granulocytes: 0 %
Lymphocytes Absolute: 1.8 10*3/uL (ref 0.7–3.1)
Lymphs: 19 %
MCH: 26.3 pg — ABNORMAL LOW (ref 26.6–33.0)
MCHC: 31 g/dL — ABNORMAL LOW (ref 31.5–35.7)
MCV: 85 fL (ref 79–97)
Monocytes Absolute: 0.7 10*3/uL (ref 0.1–0.9)
Monocytes: 7 %
Neutrophils Absolute: 6.9 10*3/uL (ref 1.4–7.0)
Neutrophils: 73 %
Platelets: 213 10*3/uL (ref 150–450)
RBC: 4.87 x10E6/uL (ref 3.77–5.28)
RDW: 13.8 % (ref 11.7–15.4)
WBC: 9.6 10*3/uL (ref 3.4–10.8)

## 2022-02-09 LAB — COMPREHENSIVE METABOLIC PANEL
ALT: 17 IU/L (ref 0–32)
AST: 18 IU/L (ref 0–40)
Albumin/Globulin Ratio: 1.8 (ref 1.2–2.2)
Albumin: 4.4 g/dL (ref 3.9–4.9)
Alkaline Phosphatase: 99 IU/L (ref 44–121)
BUN/Creatinine Ratio: 23 (ref 12–28)
BUN: 17 mg/dL (ref 8–27)
Bilirubin Total: 0.2 mg/dL (ref 0.0–1.2)
CO2: 21 mmol/L (ref 20–29)
Calcium: 10.5 mg/dL — ABNORMAL HIGH (ref 8.7–10.3)
Chloride: 105 mmol/L (ref 96–106)
Creatinine, Ser: 0.73 mg/dL (ref 0.57–1.00)
Globulin, Total: 2.4 g/dL (ref 1.5–4.5)
Glucose: 177 mg/dL — ABNORMAL HIGH (ref 70–99)
Potassium: 4.7 mmol/L (ref 3.5–5.2)
Sodium: 141 mmol/L (ref 134–144)
Total Protein: 6.8 g/dL (ref 6.0–8.5)
eGFR: 91 mL/min/{1.73_m2} (ref >59)

## 2022-02-09 LAB — URIC ACID: Uric Acid: 4 mg/dL (ref 3.0–7.2)

## 2022-02-09 NOTE — Progress Notes (Signed)
Contacted via [place] evening Izora Gala, your labs have returned: - Glucose is elevated, but trended down from last check.  Santiago Glad will get your diabetes labs at follow-up.  Continue current diabetes regimen of medications. - Calcium is mildly elevated at 10.5, I recommend cutting back on calcium supplements or products at home. - CBC shows no anemia - Uric acid level is not elevated - I do not suspect this is Gout related, I suspect what you are experiencing is more neuropathy pain due to your diabetes.  You can stop the Colchicine.  Next visit we can check B12 level and ensure this is not low, as this can cause nerve pain.  Any questions? Keep being awesome!!  Thank you for allowing me to participate in your care.  I appreciate you. Kindest regards, Demarkus Remmel

## 2022-02-11 NOTE — Progress Notes (Signed)
Contacted via [place] afternoon Lisa Matthews, your urine did return noting some growth, I am waiting on final results to determine how we need to treat:)

## 2022-02-13 NOTE — Progress Notes (Signed)
Contacted via MyChart   Waiting on final culture, but looks like infection present and will treat once final results return.:)

## 2022-02-14 LAB — URINE CULTURE

## 2022-02-14 NOTE — Progress Notes (Signed)
Good morning, please let Raniya know her urine did return showing infection present, however she is allergic to the two medications we can send to pharmacy for this (Augmentin and Macrobid) the remainder of the medications it is susceptible to are intravenous or intramuscular medications and difficult to get in home setting.  What happens with Macrobid?  I would like her to schedule with urology ASAP as she is showing resistance at this time with her recurrent UTI and we need to come up with plan to help prevent and treat.

## 2022-02-21 ENCOUNTER — Telehealth: Payer: Self-pay | Admitting: Nurse Practitioner

## 2022-02-21 NOTE — Telephone Encounter (Signed)
Copied from [place] [id number]. Topic: Medicare AWV >> Feb 21, 2022  1:04 PM Josephina Gip wrote: Reason for CRM: Left message for patient to call back and schedule the Medicare Annual Wellness Visit (AWV) virtually or by telephone.  Last AWV 02/21/21  Please schedule at anytime with [facility] Health Advisor.   Any questions, please call me at [phone]

## 2022-02-25 ENCOUNTER — Encounter: Payer: Medicare Other | Admitting: Nurse Practitioner

## 2022-02-26 ENCOUNTER — Ambulatory Visit: Payer: Medicare HMO | Admitting: Nurse Practitioner

## 2022-03-01 ENCOUNTER — Other Ambulatory Visit: Payer: Self-pay | Admitting: Nurse Practitioner

## 2022-03-01 DIAGNOSIS — E119 Type 2 diabetes mellitus without complications: Secondary | ICD-10-CM

## 2022-03-01 MED ORDER — FENOFIBRATE 145 MG PO TABS: 145.0000 mg | ORAL_TABLET | Freq: Every day | ORAL | 1 refills | Status: AC

## 2022-03-01 MED ORDER — AMLODIPINE BESYLATE 2.5 MG PO TABS: 2.5000 mg | ORAL_TABLET | Freq: Every day | ORAL | 1 refills | Status: AC

## 2022-03-01 NOTE — Telephone Encounter (Signed)
Requested Prescriptions  Pending Prescriptions Disp Refills  . atenolol (TENORMIN) 50 MG tablet [Pharmacy Med Name: Atenolol 50 MG Oral Tablet] 90 tablet 0    Sig: TAKE 1 TABLET ('50MG'$  TOTAL) BY MOUTH ONCE DAILY     Cardiovascular: Beta Blockers 2 Passed - 03/01/2022  9:33 AM      Passed - Cr in normal range and within 360 days    Creatinine, Ser  Date Value Ref Range Status  02/08/2022 0.73 0.57 - 1.00 mg/dL Final         Passed - Last BP in normal range    BP Readings from Last 1 Encounters:  02/08/22 109/70         Passed - Last Heart Rate in normal range    Pulse Readings from Last 1 Encounters:  02/08/22 (!) 58         Passed - Valid encounter within last 6 months    Recent Outpatient Visits          3 weeks ago Pain in both [place], Ovid T, NP   3 months ago Recurrent UTI   [place], Marshall P, DO   4 months ago Acute cystitis without hematuria   [facility] Jon Billings, NP   4 months ago Obstructive chronic bronchitis ([place])   [facility] Jon Billings, NP   7 months ago Obstructive chronic bronchitis ([place])   [facility] Jon Billings, NP             . omeprazole (PRILOSEC) 40 MG capsule [Pharmacy Med Name: Omeprazole 40 MG Oral Capsule Delayed Release] 90 capsule 0    Sig: Take 1 capsule by mouth once daily     Gastroenterology: Proton Pump Inhibitors Passed - 03/01/2022  9:33 AM      Passed - Valid encounter within last 12 months    Recent Outpatient Visits          3 weeks ago Pain in both [place], [place] T, NP   3 months ago Recurrent UTI   [place], [place]ed[place], DO   4 months ago Acute cystitis without hematuria   [facility] Jon Billings, NP   4 months ago Obstructive chronic bronchitis ([place])   [facility] Jon Billings, NP   7 months ago Obstructive chronic  bronchitis ([place])   [facility] Jon Billings, NP

## 2022-03-01 NOTE — Telephone Encounter (Signed)
Pt called in to make provider aware that she is currently out of her BP medication, atenolol (TENORMIN) 50 MG.    Pt says that she took her last one yesterday.   Please assist pt further    Pharmacy:  [facility] [address], [place] Phone:  [phone]  Fax:  [phone]

## 2022-03-01 NOTE — Telephone Encounter (Signed)
Requested medication (s) are due for refill today - yes  Requested medication (s) are on the active medication list -yes/no  Future visit scheduled -no  Last refill: Amlodipine 2.'5mg'$  - 07/20/21 #90 1RF                 Fenofibrate 145- 07/20/21- no longer on current medication list   Notes to clinic: Call to patient to make aware Rx had been sent to pharmacy- she requested 2 more RF- one no longer on current medication list other- no protocol showing, duplicate request for atenolol- already filled today  Requested Prescriptions  Pending Prescriptions Disp Refills   atenolol (TENORMIN) 50 MG tablet 90 tablet 0     Cardiovascular: Beta Blockers 2 Passed - 03/01/2022 10:30 AM      Passed - Cr in normal range and within 360 days    Creatinine, Ser  Date Value Ref Range Status  02/08/2022 0.73 0.57 - 1.00 mg/dL Final         Passed - Last BP in normal range    BP Readings from Last 1 Encounters:  02/08/22 109/70         Passed - Last Heart Rate in normal range    Pulse Readings from Last 1 Encounters:  02/08/22 (!) 58         Passed - Valid encounter within last 6 months    Recent Outpatient Visits           3 weeks ago Pain in both [place], [place] T, NP   3 months ago Recurrent UTI   [place], [place] P, DO   4 months ago Acute cystitis without hematuria   [facility] Jon Billings, NP   4 months ago Obstructive chronic bronchitis ([place])   [facility] Jon Billings, NP   7 months ago Obstructive chronic bronchitis ([place])   [facility] Jon Billings, NP               amLODipine (NORVASC) 2.5 MG tablet 90 tablet 1    Sig: Take 1 tablet (2.5 mg total) by mouth daily.     There is no refill protocol information for this order     fenofibrate (TRICOR) 145 MG tablet 90 tablet 1    Sig: Take 1 tablet (145 mg total) by mouth daily.     There is no refill protocol  information for this order    Signed Prescriptions Disp Refills   atenolol (TENORMIN) 50 MG tablet 90 tablet 0    Sig: TAKE 1 TABLET ('50MG'$  TOTAL) BY MOUTH ONCE DAILY     Cardiovascular: Beta Blockers 2 Passed - 03/01/2022  9:33 AM      Passed - Cr in normal range and within 360 days    Creatinine, Ser  Date Value Ref Range Status  02/08/2022 0.73 0.57 - 1.00 mg/dL Final         Passed - Last BP in normal range    BP Readings from Last 1 Encounters:  02/08/22 109/70         Passed - Last Heart Rate in normal range    Pulse Readings from Last 1 Encounters:  02/08/22 (!) 58         Passed - Valid encounter within last 6 months    Recent Outpatient Visits           3 weeks ago Pain in both feet   [facility] Marnee Guarneri T, NP   3 months ago Recurrent UTI   [place], Megan P, DO   4 months ago Acute cystitis without hematuria   [facility] Jon Billings, NP   4 months ago Obstructive chronic bronchitis ([place])   [facility] Jon Billings, NP   7 months ago Obstructive chronic bronchitis ([place])   [facility] Jon Billings, NP               omeprazole (PRILOSEC) 40 MG capsule 90 capsule 0    Sig: Take 1 capsule by mouth once daily     Gastroenterology: Proton Pump Inhibitors Passed - 03/01/2022  9:33 AM      Passed - Valid encounter within last 12 months    Recent Outpatient Visits           3 weeks ago Pain in both [place], [place] T, NP   3 months ago Recurrent UTI   [place], Megan P, DO   4 months ago Acute cystitis without hematuria   [facility] Jon Billings, NP   4 months ago Obstructive chronic bronchitis ([place])   [facility] Jon Billings, NP   7 months ago Obstructive chronic bronchitis ([place])   [facility] Jon Billings, NP                 Requested  Prescriptions  Pending Prescriptions Disp Refills   atenolol (TENORMIN) 50 MG tablet 90 tablet 0     Cardiovascular: Beta Blockers 2 Passed - 03/01/2022 10:30 AM      Passed - Cr in normal range and within 360 days    Creatinine, Ser  Date Value Ref Range Status  02/08/2022 0.73 0.57 - 1.00 mg/dL Final         Passed - Last BP in normal range    BP Readings from Last 1 Encounters:  02/08/22 109/70         Passed - Last Heart Rate in normal range    Pulse Readings from Last 1 Encounters:  02/08/22 (!) 58         Passed - Valid encounter within last 6 months    Recent Outpatient Visits           3 weeks ago Pain in both [place], [place] T, NP   3 months ago Recurrent UTI   [place], [place] P, DO   4 months ago Acute cystitis without hematuria   [facility] Jon Billings, NP   4 months ago Obstructive chronic bronchitis ([place])   [facility] Jon Billings, NP   7 months ago Obstructive chronic bronchitis ([place])   [facility] Jon Billings, NP               amLODipine (NORVASC) 2.5 MG tablet 90 tablet 1    Sig: Take 1 tablet (2.5 mg total) by mouth daily.     There is no refill protocol information for this order     fenofibrate (TRICOR) 145 MG tablet 90 tablet 1    Sig: Take 1 tablet (145 mg total) by mouth daily.     There is no refill protocol information for this order    Signed Prescriptions Disp Refills   atenolol (TENORMIN) 50 MG tablet 90 tablet 0    Sig: TAKE 1 TABLET ('50MG'$  TOTAL) BY MOUTH ONCE DAILY  Cardiovascular: Beta Blockers 2 Passed - 03/01/2022  9:33 AM      Passed - Cr in normal range and within 360 days    Creatinine, Ser  Date Value Ref Range Status  02/08/2022 0.73 0.57 - 1.00 mg/dL Final         Passed - Last BP in normal range    BP Readings from Last 1 Encounters:  02/08/22 109/70         Passed - Last Heart Rate in normal  range    Pulse Readings from Last 1 Encounters:  02/08/22 (!) 58         Passed - Valid encounter within last 6 months    Recent Outpatient Visits           3 weeks ago Pain in both [place], [place] T, NP   3 months ago Recurrent UTI   [place], [place] P, DO   4 months ago Acute cystitis without hematuria   [facility] Jon Billings, NP   4 months ago Obstructive chronic bronchitis ([place])   [facility] Jon Billings, NP   7 months ago Obstructive chronic bronchitis ([place])   [facility] Jon Billings, NP               omeprazole (PRILOSEC) 40 MG capsule 90 capsule 0    Sig: Take 1 capsule by mouth once daily     Gastroenterology: Proton Pump Inhibitors Passed - 03/01/2022  9:33 AM      Passed - Valid encounter within last 12 months    Recent Outpatient Visits           3 weeks ago Pain in both [place], [place] T, NP   3 months ago Recurrent UTI   [place], [place], DO   4 months ago Acute cystitis without hematuria   [facility] Jon Billings, NP   4 months ago Obstructive chronic bronchitis ([place])   [facility] Jon Billings, NP   7 months ago Obstructive chronic bronchitis ([place])   [facility] Jon Billings, NP

## 2022-03-05 ENCOUNTER — Ambulatory Visit (INDEPENDENT_AMBULATORY_CARE_PROVIDER_SITE_OTHER): Payer: Medicare HMO | Admitting: Nurse Practitioner

## 2022-03-05 ENCOUNTER — Encounter: Payer: Self-pay | Admitting: Nurse Practitioner

## 2022-03-05 VITALS — Wt 192.8 lb

## 2022-03-05 DIAGNOSIS — I10 Essential (primary) hypertension: Secondary | ICD-10-CM

## 2022-03-05 NOTE — Progress Notes (Unsigned)
There were no vitals taken for this visit.   Subjective:    Patient ID: Lisa Matthews, female    DOB: [birth year], 66 y.o.   MRN: [id number]  HPI: Lisa Matthews is a 66 y.o. female presenting on 03/05/2022 for comprehensive medical examination. Current medical complaints include:{Blank single:19197::"none","***"}  She currently lives with: Menopausal Symptoms: {Blank single:19197::"yes","no"}  HYPERTENSION / HYPERLIPIDEMIA Satisfied with current treatment? no Duration of hypertension: years BP monitoring frequency: daily BP range: 120-130/70-80 BP medication side effects: no Past BP meds: amlodipine, atenolol, and losartan (cozaar) Duration of hyperlipidemia: years Cholesterol medication side effects: no Cholesterol supplements: none Past cholesterol medications: fenofibrate (tricor) Medication compliance: excellent compliance Aspirin: no Recent stressors: no Recurrent headaches: no Visual changes: no Palpitations: no Dyspnea: no Chest pain: no Lower extremity edema: no Dizzy/lightheaded: no   DIABETES Hypoglycemic episodes:no Polydipsia/polyuria: no Visual disturbance: no Chest pain: no Paresthesias: no Glucose Monitoring: yes             Accucheck frequency: Daily             Fasting glucose: 120             Post prandial:             Evening:             Before meals: Taking Insulin?: no             Long acting insulin:             Short acting insulin: Blood Pressure Monitoring: daily Retinal Examination: Not up to Date Foot Exam:  Up to date Diabetic Education: Not Completed Pneumovax: Up to Date Influenza: Up to Date Aspirin: no   COPD COPD status: controlled Satisfied with current treatment?: yes Oxygen use: no Pneumovax: Up to Date Influenza: Up to Date  Functional Status Survey:       10/22/2021    1:14 PM 10/04/2021    1:59 PM 06/27/2021   11:55 AM 06/05/2021   10:21 AM 05/17/2021   11:11 AM  [place] in the past year? '1 1 1 ' 0  0  Number falls in past yr: 1 0 1 0 0  Injury with Fall? '1 1 1 ' 0 0  Risk for fall due to : No Fall Risks History of fall(s) Other (Comment) No Fall Risks No Fall Risks  Follow up Falls evaluation completed Falls evaluation completed Falls evaluation completed Falls evaluation completed Falls evaluation completed    Depression Screen    10/22/2021    1:14 PM 10/04/2021    1:59 PM 06/27/2021   11:55 AM 06/05/2021   10:21 AM 05/30/2021   11:04 AM  Depression screen PHQ 2/9  Decreased Interest 0 0 0 0   Down, Depressed, Hopeless 0 0 0 2   PHQ - 2 Score 0 0 0 2   Altered sleeping 0 0 3 0   Tired, decreased energy '2 3 3 3   ' Change in appetite 0 0 1 2   Feeling bad or failure about yourself  0 0 0 0   Trouble concentrating 0 0 0 1   Moving slowly or fidgety/restless 0 0 0 0   Suicidal thoughts 0 0 0 0   PHQ-9 Score '2 3 7 8   ' Difficult doing work/chores Not difficult at all Not difficult at all  Somewhat difficult      Information is confidential and restricted. Go to Review Flowsheets to unlock data.  Advanced Directives Does patient have a HCPOA?    {Blank single:19197::"yes","no"} If yes, name and contact information:  Does patient have a living will or MOST form?  {Blank single:19197::"yes","no"}  Past Medical History:  Past Medical History:  Diagnosis Date   Allergy    Anxiety    Arthritis    COPD (chronic obstructive pulmonary disease) ([place])    Depression    Diabetes mellitus without complication (HCC)    GERD (gastroesophageal reflux disease)    Heart murmur    Hypertension    Kidney stones    Macular degeneration    Osteoporosis    Sleep apnea    Stroke [facility])     Surgical History:  Past Surgical History:  Procedure Laterality Date   CESAREAN SECTION     CHOLECYSTECTOMY     COLONOSCOPY WITH PROPOFOL N/A 05/31/2021   Procedure: COLONOSCOPY WITH PROPOFOL;  Surgeon: Annamaria Helling, DO;  Location: [facility] ENDOSCOPY;  Service: Gastroenterology;  Laterality:  N/A;   CYSTOSCOPY/URETEROSCOPY/HOLMIUM LASER/STENT PLACEMENT     ESOPHAGOGASTRODUODENOSCOPY (EGD) WITH PROPOFOL N/A 05/31/2021   Procedure: ESOPHAGOGASTRODUODENOSCOPY (EGD) WITH PROPOFOL;  Surgeon: Annamaria Helling, DO;  Location: [facility] ENDOSCOPY;  Service: Gastroenterology;  Laterality: N/A;  DM   FRACTURE SURGERY     JOINT REPLACEMENT Right    knee   TONSILLECTOMY     TUBAL LIGATION      Medications:  Current Outpatient Medications on File Prior to Visit  Medication Sig   ACCU-CHEK GUIDE test strip USE TO CHECK BLOOD SUGAR 4 TIMES DAILY   amLODipine (NORVASC) 2.5 MG tablet Take 1 tablet (2.5 mg total) by mouth daily.   atenolol (TENORMIN) 50 MG tablet TAKE 1 TABLET (50MG TOTAL) BY MOUTH ONCE DAILY   Biotin 10000 MCG TABS Take 1 tablet by mouth daily.   Blood Glucose Monitoring Suppl (ACCU-CHEK GUIDE ME) w/Device KIT CHECK BLOOD SUGAR 4 TIMES DAILY   Calcium Carbonate-Vitamin D 600-400 MG-UNIT tablet Take 1 tablet by mouth daily.   cefdinir (OMNICEF) 300 MG capsule Take 1 capsule (300 mg total) by mouth 2 (two) times daily.   Cholecalciferol 25 MCG (1000 UT) tablet Take 1,000 Units by mouth daily.   clotrimazole-betamethasone (LOTRISONE) cream APPLY  CREAM TOPICALLY TO AFFECTED AREA ONCE DAILY   colchicine 0.6 MG tablet Take 1.2 mg (two tablets) at the first sign of flare, followed by 0.6 mg after 1 hour.  Then on day 2 take 0.6 MG (one tablet) daily until symptoms resolved.   cyanocobalamin 1000 MCG tablet Take 1,000 mcg by mouth daily.   dapagliflozin propanediol (FARXIGA) 10 MG TABS tablet Take 1 tablet (10 mg total) by mouth daily.   estradiol (ESTRACE) 0.1 MG/GM vaginal cream Estrogen Cream Instruction Discard applicator Apply pea sized amount to tip of finger to urethra before bed. Wash hands well after application. Use Monday, Wednesday and Friday   fenofibrate (TRICOR) 145 MG tablet Take 1 tablet (145 mg total) by mouth daily.   gabapentin (NEURONTIN) 300 MG capsule TAKE 1  CAPSULE (300MG) BY MOUTH 4 TIMES DAILY   insulin detemir (LEVEMIR FLEXPEN) 100 UNIT/ML FlexPen Inject 10 Units into the skin daily.   Insulin Pen Needle (PEN NEEDLES 3/16") 31G X 5 MM MISC 1 application by Does not apply route daily.   Lancets MISC    linaclotide (LINZESS) 145 MCG CAPS capsule Take 1 capsule (145 mcg total) by mouth daily before breakfast.   losartan (COZAAR) 25 MG tablet Take 1 tablet (25 mg total) by mouth daily.  metFORMIN (GLUCOPHAGE) 500 MG tablet Take 1 tablet (500 mg total) by mouth in the morning, at noon, and at bedtime.   mirtazapine (REMERON) 30 MG tablet TAKE 1 TABLET (30MG TOTAL) BY MOUTH AT BEDTIME   omeprazole (PRILOSEC) 40 MG capsule Take 1 capsule by mouth once daily   oxyCODONE (OXY IR/ROXICODONE) 5 MG immediate release tablet Take 5 mg by mouth 3 (three) times daily as needed.   Teriparatide, Recombinant, 620 MCG/2.48ML SOPN Inject into the skin at bedtime.   venlafaxine XR (EFFEXOR-XR) 150 MG 24 hr capsule TAKE 1 CAPSULE BY MOUTH IN THE MORNING AND AT BEDTIME Strength: 150 mg   vitamin C (ASCORBIC ACID) 500 MG tablet Take 500 mg by mouth daily.   zinc gluconate 50 MG tablet Take 50 mg by mouth daily.   No current facility-administered medications on file prior to visit.    Allergies:  Allergies  Allergen Reactions   Sitagliptin Anaphylaxis   Atorvastatin Other (See Comments) and Rash    Pain Pain Pain Pain    Bupropion Other (See Comments)    "Make me delirious" "Make me delirious" "Make me delirious" "Make me delirious"    Nitrofurantoin Macrocrystal Rash   Nsaids Hives and Rash   Other Hives   Pravastatin Other (See Comments)   Ciprofloxacin Other (See Comments)    Patient does not know Patient does not know    Doxycycline     headache   Antihistamines, Loratadine-Type Rash   Baclofen Rash   Cephalexin Rash   Exenatide Rash   Ezetimibe Rash   Fluconazole Rash   Hydromorphone Rash   Morphine Rash   Oxybutynin Nausea And  Vomiting   Penicillins Rash   Prednisone Rash   Simvastatin Rash   Sulfa Antibiotics Rash   Trazodone Rash   Wound Dressing Adhesive Rash    Social History:  Social History   Socioeconomic History   Marital status: Widowed    Spouse name: Not on file   Number of children: Not on file   Years of education: Not on file   Highest education level: Not on file  Occupational History   Not on file  Tobacco Use   Smoking status: Former    Packs/day: 1.50    Years: 10.00    Total pack years: 15.00    Types: Cigarettes    Quit date: 02/02/2006    Years since quitting: 16.0   Smokeless tobacco: Never  Vaping Use   Vaping Use: Every day  Substance and Sexual Activity   Alcohol use: Never   Drug use: Never   Sexual activity: Yes    Birth control/protection: Surgical  Other Topics Concern   Not on file  Social History Narrative   Not on file   Social Determinants of Health   Financial Resource Strain: Medium Risk (02/21/2021)   Overall Financial Resource Strain (CARDIA)    Difficulty of Paying Living Expenses: Somewhat hard  Food Insecurity: No Food Insecurity (02/21/2021)   Hunger Vital Sign    Worried About Running Out of Food in the Last Year: Never true    Ran Out of Food in the Last Year: Never true  Transportation Needs: No Transportation Needs (02/21/2021)   PRAPARE - Hydrologist (Medical): No    Lack of Transportation (Non-Medical): No  Physical Activity: Sufficiently Active (02/21/2021)   Exercise Vital Sign    Days of Exercise per Week: 7 days    Minutes of Exercise per Session: 60  min  Stress: No Stress Concern Present (02/21/2021)   [place]    Feeling of Stress : Not at all  Social Connections: Socially Isolated (02/21/2021)   Social Connection and Isolation Panel [NHANES]    Frequency of Communication with Friends and Family: Never    Frequency of Social Gatherings  with Friends and Family: Never    Attends Religious Services: Never    Marine scientist or Organizations: No    Attends Archivist Meetings: Never    Marital Status: Widowed  Intimate Partner Violence: Not At Risk (02/21/2021)   Humiliation, Afraid, Rape, and Kick questionnaire    Fear of Current or Ex-Partner: No    Emotionally Abused: No    Physically Abused: No    Sexually Abused: No   Social History   Tobacco Use  Smoking Status Former   Packs/day: 1.50   Years: 10.00   Total pack years: 15.00   Types: Cigarettes   Quit date: 02/02/2006   Years since quitting: 16.0  Smokeless Tobacco Never   Social History   Substance and Sexual Activity  Alcohol Use Never    Family History:  Family History  Problem Relation Age of Onset   Heart attack Mother 80   Emphysema Father    Heart attack Father 27   Heart disease Father    Cancer Sister    Bipolar disorder Brother    Drug abuse Daughter    Drug abuse Son     Past medical history, surgical history, medications, allergies, family history and social history reviewed with patient today and changes made to appropriate areas of the chart.   ROS  All other ROS negative except what is listed above and in the HPI.      Objective:    There were no vitals taken for this visit.  Wt Readings from Last 3 Encounters:  02/08/22 192 lb 11.2 oz (87.4 kg)  12/19/21 190 lb (86.2 kg)  11/26/21 191 lb 9.6 oz (86.9 kg)    No results found.  Physical Exam     02/21/2021    6:10 PM  6CIT Screen  What Year? 0 points  What month? 0 points  What time? 0 points  Count back from 20 0 points  Months in reverse 0 points    Cognitive Testing - 6-CIT  Correct? Score   What year is it? {YES NO:22349} {Numbers; 0-4:31231} Yes = 0    No = 4  What month is it? {YES NO:22349} {Numbers; 0-4:31231} Yes = 0    No = 3  Remember:     Pia [address]au, [place], [place]     What time is it? {YES NO:22349} {Numbers; 0-4:31231}  Yes = 0    No = 3  Count backwards from 20 to 1 {YES NO:22349} {Numbers; 0-4:31231} Correct = 0    1 error = 2   More than 1 error = 4  Say the months of the year in reverse. {YES NO:22349} {Numbers; 0-4:31231} Correct = 0    1 error = 2   More than 1 error = 4  What address did I ask you to remember? {YES NO:22349} {NUMBERS; 0-10:5044} Correct = 0  1 error = 2    2 error = 4    3 error = 6    4 error = 8    All wrong = 10       TOTAL  SCORE  {Numbers; 2-35:57322}/02   Interpretation:  {Desc; normal/abnormal:11317::"Normal"}  Normal (0-7) Abnormal (8-28)   Results for orders placed or performed in visit on 02/08/22  Urine Culture   Specimen: Urine   UR  Result Value Ref Range   Urine Culture, Routine Final report (A)    Organism ID, Bacteria Comment (A)    ORGANISM ID, BACTERIA Comment    Antimicrobial Susceptibility Comment   Uric acid  Result Value Ref Range   Uric Acid 4.0 3.0 - 7.2 mg/dL  Comp Met (CMET)  Result Value Ref Range   Glucose 177 (H) 70 - 99 mg/dL   BUN 17 8 - 27 mg/dL   Creatinine, Ser 0.73 0.57 - 1.00 mg/dL   eGFR 91 >59 mL/min/1.73   BUN/Creatinine Ratio 23 12 - 28   Sodium 141 134 - 144 mmol/L   Potassium 4.7 3.5 - 5.2 mmol/L   Chloride 105 96 - 106 mmol/L   CO2 21 20 - 29 mmol/L   Calcium 10.5 (H) 8.7 - 10.3 mg/dL   Total Protein 6.8 6.0 - 8.5 g/dL   Albumin 4.4 3.9 - 4.9 g/dL   Globulin, Total 2.4 1.5 - 4.5 g/dL   Albumin/Globulin Ratio 1.8 1.2 - 2.2   Bilirubin Total 0.2 0.0 - 1.2 mg/dL   Alkaline Phosphatase 99 44 - 121 IU/L   AST 18 0 - 40 IU/L   ALT 17 0 - 32 IU/L  CBC with Differential/Platelet  Result Value Ref Range   WBC 9.6 3.4 - 10.8 x10E3/uL   RBC 4.87 3.77 - 5.28 x10E6/uL   Hemoglobin 12.8 11.1 - 15.9 g/dL   Hematocrit 41.3 34.0 - 46.6 %   MCV 85 79 - 97 fL   MCH 26.3 (L) 26.6 - 33.0 pg   MCHC 31.0 (L) 31.5 - 35.7 g/dL   RDW 13.8 11.7 - 15.4 %   Platelets 213 150 - 450 x10E3/uL   Neutrophils 73 Not Estab. %   Lymphs 19 Not Estab.  %   Monocytes 7 Not Estab. %   Eos 1 Not Estab. %   Basos 0 Not Estab. %   Neutrophils Absolute 6.9 1.4 - 7.0 x10E3/uL   Lymphocytes Absolute 1.8 0.7 - 3.1 x10E3/uL   Monocytes Absolute 0.7 0.1 - 0.9 x10E3/uL   EOS (ABSOLUTE) 0.1 0.0 - 0.4 x10E3/uL   Basophils Absolute 0.0 0.0 - 0.2 x10E3/uL   Immature Granulocytes 0 Not Estab. %   Immature Grans (Abs) 0.0 0.0 - 0.1 x10E3/uL  Urinalysis, Routine w reflex microscopic  Result Value Ref Range   Specific Gravity, UA 1.015 1.005 - 1.030   pH, UA 6.0 5.0 - 7.5   Color, UA Yellow Yellow   Appearance Ur Clear Clear   Leukocytes,UA Negative Negative   Protein,UA Negative Negative/Trace   Glucose, UA 3+ (A) Negative   Ketones, UA Negative Negative   RBC, UA Negative Negative   Bilirubin, UA Negative Negative   Urobilinogen, Ur 0.2 0.2 - 1.0 mg/dL   Nitrite, UA Negative Negative      Assessment & Plan:   Problem List Items Addressed This Visit       Cardiovascular and Mediastinum   Essential hypertension - Primary     Preventative Services:  AAA screening:  Health Risk Assessment and Personalized Prevention Plan: Bone Mass Measurements: Breast Cancer Screening: CVD Screening:  Cervical Cancer Screening: Colon Cancer Screening:  Depression Screening:  Diabetes Screening:  Glaucoma Screening:  Hepatitis B vaccine: Hepatitis C screening:  HIV  Screening: Flu Vaccine: Lung cancer Screening: Obesity Screening:  Pneumonia Vaccines (2): STI Screening:  Follow up plan: No follow-ups on file.   LABORATORY TESTING:  - Pap smear: {Blank CEYEMV:36122::"ESL done","not applicable","up to date","done elsewhere"}  IMMUNIZATIONS:   - Tdap: Tetanus vaccination status reviewed: {tetanus status:315746}. - Influenza: {Blank single:19197::"Up to date","Administered today","Postponed to flu season","Refused","Given elsewhere"} - Pneumovax: {Blank single:19197::"Up to date","Administered today","Not applicable","Refused","Given  elsewhere"} - Prevnar: {Blank single:19197::"Up to date","Administered today","Not applicable","Refused","Given elsewhere"} - Zostavax vaccine: {Blank single:19197::"Up to date","Administered today","Not applicable","Refused","Given elsewhere"}  SCREENING: -Mammogram: {Blank single:19197::"Up to date","Ordered today","Not applicable","Refused","Done elsewhere"}  - Colonoscopy: {Blank single:19197::"Up to date","Ordered today","Not applicable","Refused","Done elsewhere"}  - Bone Density: {Blank single:19197::"Up to date","Ordered today","Not applicable","Refused","Done elsewhere"}  -Hearing Test: {Blank single:19197::"Up to date","Ordered today","Not applicable","Refused","Done elsewhere"}  -Spirometry: {Blank single:19197::"Up to date","Ordered today","Not applicable","Refused","Done elsewhere"}   PATIENT COUNSELING:   Advised to take 1 mg of folate supplement per day if capable of pregnancy.   Sexuality: Discussed sexually transmitted diseases, partner selection, use of condoms, avoidance of unintended pregnancy  and contraceptive alternatives.   Advised to avoid cigarette smoking.  I discussed with the patient that most people either abstain from alcohol or drink within safe limits (<=14/week and <=4 drinks/occasion for males, <=7/weeks and <= 3 drinks/occasion for females) and that the risk for alcohol disorders and other health effects rises proportionally with the number of drinks per week and how often a drinker exceeds daily limits.  Discussed cessation/primary prevention of drug use and availability of treatment for abuse.   Diet: Encouraged to adjust caloric intake to maintain  or achieve ideal body weight, to reduce intake of dietary saturated fat and total fat, to limit sodium intake by avoiding high sodium foods and not adding table salt, and to maintain adequate dietary potassium and calcium preferably from fresh fruits, vegetables, and low-fat dairy products.    stressed the  importance of regular exercise  Injury prevention: Discussed safety belts, safety helmets, smoke detector, smoking near bedding or upholstery.   Dental health: Discussed importance of regular tooth brushing, flossing, and dental visits.    NEXT PREVENTATIVE PHYSICAL DUE IN 1 YEAR. No follow-ups on file.

## 2022-03-06 ENCOUNTER — Encounter: Payer: Self-pay | Admitting: Nurse Practitioner

## 2022-03-06 NOTE — Progress Notes (Signed)
Patient left without being seen by Provider.

## 2022-09-05 ENCOUNTER — Telehealth: Payer: Self-pay

## 2022-09-05 NOTE — Progress Notes (Signed)
   09/05/2022  Patient ID: Lisa Matthews, female   DOB: 1955-09-11, 67 y.o.   MRN: [id number]  Telephone outreach to discuss recent discontinuation of Levemir, which patient states she is no longer taking.  Did inquire about coverage of CGM on insurance, because she is having trouble getting Dexcom covered.  Upon discussing further, realized patient recently moved and no longer followed by [facility].  She does have a PCP she sees regularly, so I provided the number they would reach out to for CGM coverage information.  Darlina Guys, PharmD, DPLA

## 2022-09-09 ENCOUNTER — Telehealth: Payer: Self-pay | Admitting: Nurse Practitioner

## 2022-09-09 NOTE — Telephone Encounter (Signed)
Contacted Lisa Matthews to schedule their annual wellness visit. Patient declined to schedule AWV at this time.Patient stated [place] of [place].    Verlee Rossetti; Care Guide Ambulatory Clinical Support [place] [facility] Direct Dial: [phone]
